# Patient Record
Sex: Female | Born: 1950 | Race: White | Hispanic: No | Smoking: Heavy tobacco smoker
Health system: Southern US, Community
[De-identification: ages and names within clinical notes are randomized; demographics above are authoritative.]

## PROBLEM LIST (undated history)

## (undated) DIAGNOSIS — M6281 Muscle weakness (generalized): Secondary | ICD-10-CM

## (undated) DIAGNOSIS — Z79899 Other long term (current) drug therapy: Secondary | ICD-10-CM

## (undated) DIAGNOSIS — H469 Unspecified optic neuritis: Secondary | ICD-10-CM

## (undated) DIAGNOSIS — R635 Abnormal weight gain: Secondary | ICD-10-CM

## (undated) DIAGNOSIS — I8390 Asymptomatic varicose veins of unspecified lower extremity: Secondary | ICD-10-CM

## (undated) DIAGNOSIS — R55 Syncope and collapse: Secondary | ICD-10-CM

## (undated) DIAGNOSIS — E669 Obesity, unspecified: Secondary | ICD-10-CM

## (undated) DIAGNOSIS — R609 Edema, unspecified: Secondary | ICD-10-CM

## (undated) DIAGNOSIS — H04129 Dry eye syndrome of unspecified lacrimal gland: Secondary | ICD-10-CM

## (undated) DIAGNOSIS — J329 Chronic sinusitis, unspecified: Secondary | ICD-10-CM

## (undated) DIAGNOSIS — R1013 Epigastric pain: Secondary | ICD-10-CM

## (undated) DIAGNOSIS — M949 Disorder of cartilage, unspecified: Secondary | ICD-10-CM

## (undated) DIAGNOSIS — R269 Unspecified abnormalities of gait and mobility: Secondary | ICD-10-CM

## (undated) DIAGNOSIS — F17200 Nicotine dependence, unspecified, uncomplicated: Secondary | ICD-10-CM

## (undated) DIAGNOSIS — E039 Hypothyroidism, unspecified: Secondary | ICD-10-CM

## (undated) DIAGNOSIS — M899 Disorder of bone, unspecified: Secondary | ICD-10-CM

## (undated) DIAGNOSIS — F411 Generalized anxiety disorder: Secondary | ICD-10-CM

## (undated) DIAGNOSIS — I803 Phlebitis and thrombophlebitis of lower extremities, unspecified: Secondary | ICD-10-CM

## (undated) DIAGNOSIS — H268 Other specified cataract: Secondary | ICD-10-CM

## (undated) DIAGNOSIS — I80299 Phlebitis and thrombophlebitis of other deep vessels of unspecified lower extremity: Secondary | ICD-10-CM

## (undated) DIAGNOSIS — R7989 Other specified abnormal findings of blood chemistry: Secondary | ICD-10-CM

## (undated) DIAGNOSIS — G4710 Hypersomnia, unspecified: Secondary | ICD-10-CM

## (undated) DIAGNOSIS — R252 Cramp and spasm: Secondary | ICD-10-CM

## (undated) DIAGNOSIS — IMO0001 Reserved for inherently not codable concepts without codable children: Secondary | ICD-10-CM

## (undated) DIAGNOSIS — B029 Zoster without complications: Secondary | ICD-10-CM

## (undated) DIAGNOSIS — R21 Rash and other nonspecific skin eruption: Secondary | ICD-10-CM

## (undated) DIAGNOSIS — I10 Essential (primary) hypertension: Secondary | ICD-10-CM

## (undated) DIAGNOSIS — Z7901 Long term (current) use of anticoagulants: Secondary | ICD-10-CM

## (undated) DIAGNOSIS — M62838 Other muscle spasm: Secondary | ICD-10-CM

## (undated) DIAGNOSIS — M79609 Pain in unspecified limb: Secondary | ICD-10-CM

## (undated) DIAGNOSIS — M545 Low back pain, unspecified: Secondary | ICD-10-CM

## (undated) DIAGNOSIS — R1031 Right lower quadrant pain: Secondary | ICD-10-CM

## (undated) DIAGNOSIS — H9319 Tinnitus, unspecified ear: Secondary | ICD-10-CM

## (undated) DIAGNOSIS — R002 Palpitations: Secondary | ICD-10-CM

## (undated) DIAGNOSIS — G4700 Insomnia, unspecified: Secondary | ICD-10-CM

## (undated) DIAGNOSIS — Z95828 Presence of other vascular implants and grafts: Secondary | ICD-10-CM

## (undated) DIAGNOSIS — E785 Hyperlipidemia, unspecified: Secondary | ICD-10-CM

## (undated) DIAGNOSIS — M2550 Pain in unspecified joint: Secondary | ICD-10-CM

## (undated) DIAGNOSIS — R42 Dizziness and giddiness: Secondary | ICD-10-CM

## (undated) HISTORY — DX: Dizziness and giddiness: R42

## (undated) HISTORY — DX: Asymptomatic varicose veins of unspecified lower extremity: I83.90

## (undated) HISTORY — DX: Essential (primary) hypertension: I10

## (undated) HISTORY — DX: Unspecified optic neuritis: H46.9

## (undated) HISTORY — DX: Phlebitis and thrombophlebitis of other deep vessels of unspecified lower extremity: I80.299

## (undated) HISTORY — DX: Syncope and collapse: R55

## (undated) HISTORY — DX: Other specified abnormal findings of blood chemistry: R79.89

## (undated) HISTORY — DX: Long term (current) use of anticoagulants: Z79.01

## (undated) HISTORY — DX: Edema, unspecified: R60.9

## (undated) HISTORY — DX: Low back pain, unspecified: M54.50

## (undated) HISTORY — DX: Presence of other vascular implants and grafts: Z95.828

## (undated) HISTORY — DX: Other specified cataract: H26.8

## (undated) HISTORY — DX: Reserved for inherently not codable concepts without codable children: IMO0001

## (undated) HISTORY — DX: Obesity, unspecified: E66.9

## (undated) HISTORY — DX: Generalized anxiety disorder: F41.1

## (undated) HISTORY — DX: Dry eye syndrome of unspecified lacrimal gland: H04.129

## (undated) HISTORY — DX: Other muscle spasm: M62.838

## (undated) HISTORY — DX: Tinnitus, unspecified ear: H93.19

## (undated) HISTORY — DX: Insomnia, unspecified: G47.00

## (undated) HISTORY — DX: Unspecified abnormalities of gait and mobility: R26.9

## (undated) HISTORY — DX: Rash and other nonspecific skin eruption: R21

## (undated) HISTORY — DX: Low back pain: M54.5

## (undated) HISTORY — DX: Right lower quadrant pain: R10.31

## (undated) HISTORY — DX: Pain in unspecified joint: M25.50

## (undated) HISTORY — DX: Pain in unspecified limb: M79.609

## (undated) HISTORY — DX: Hyperlipidemia, unspecified: E78.5

## (undated) HISTORY — DX: Disorder of cartilage, unspecified: M94.9

## (undated) HISTORY — DX: Cramp and spasm: R25.2

## (undated) HISTORY — DX: Other long term (current) drug therapy: Z79.899

## (undated) HISTORY — DX: Disorder of bone, unspecified: M89.9

## (undated) HISTORY — DX: Hypersomnia, unspecified: G47.10

## (undated) HISTORY — PX: CATARACT EXTRACTION, BILATERAL: SHX1313

## (undated) HISTORY — DX: Chronic sinusitis, unspecified: J32.9

## (undated) HISTORY — DX: Nicotine dependence, unspecified, uncomplicated: F17.200

## (undated) HISTORY — DX: Hypothyroidism, unspecified: E03.9

## (undated) HISTORY — DX: Abnormal weight gain: R63.5

## (undated) HISTORY — DX: Palpitations: R00.2

## (undated) HISTORY — DX: Phlebitis and thrombophlebitis of lower extremities, unspecified: I80.3

## (undated) HISTORY — DX: Epigastric pain: R10.13

## (undated) HISTORY — DX: Zoster without complications: B02.9

## (undated) HISTORY — DX: Muscle weakness (generalized): M62.81

## (undated) HISTORY — PX: EYE SURGERY: SHX253

---

## 1969-04-27 HISTORY — PX: OTHER SURGICAL HISTORY: SHX169

## 1984-04-27 HISTORY — PX: OTHER SURGICAL HISTORY: SHX169

## 2001-08-25 ENCOUNTER — Encounter: Payer: Self-pay | Admitting: Internal Medicine

## 2001-08-25 ENCOUNTER — Encounter: Admission: RE | Admit: 2001-08-25 | Discharge: 2001-08-25 | Payer: Self-pay | Admitting: Internal Medicine

## 2002-02-23 ENCOUNTER — Emergency Department (HOSPITAL_COMMUNITY): Admission: EM | Admit: 2002-02-23 | Discharge: 2002-02-23 | Payer: Self-pay | Admitting: Emergency Medicine

## 2002-03-16 ENCOUNTER — Ambulatory Visit (HOSPITAL_COMMUNITY): Admission: RE | Admit: 2002-03-16 | Discharge: 2002-03-16 | Payer: Self-pay | Admitting: Internal Medicine

## 2002-03-16 ENCOUNTER — Inpatient Hospital Stay (HOSPITAL_COMMUNITY): Admission: EM | Admit: 2002-03-16 | Discharge: 2002-03-24 | Payer: Self-pay | Admitting: Emergency Medicine

## 2002-03-20 ENCOUNTER — Encounter: Payer: Self-pay | Admitting: Internal Medicine

## 2002-04-27 DIAGNOSIS — I82409 Acute embolism and thrombosis of unspecified deep veins of unspecified lower extremity: Secondary | ICD-10-CM | POA: Insufficient documentation

## 2006-04-27 HISTORY — PX: OTHER SURGICAL HISTORY: SHX169

## 2007-03-15 ENCOUNTER — Ambulatory Visit: Payer: Self-pay | Admitting: Vascular Surgery

## 2007-03-21 ENCOUNTER — Ambulatory Visit (HOSPITAL_COMMUNITY): Admission: RE | Admit: 2007-03-21 | Discharge: 2007-03-21 | Payer: Self-pay | Admitting: Vascular Surgery

## 2007-03-21 ENCOUNTER — Ambulatory Visit: Payer: Self-pay | Admitting: Vascular Surgery

## 2007-03-29 ENCOUNTER — Ambulatory Visit: Payer: Self-pay | Admitting: Vascular Surgery

## 2007-05-26 HISTORY — PX: OTHER SURGICAL HISTORY: SHX169

## 2007-05-27 ENCOUNTER — Inpatient Hospital Stay (HOSPITAL_COMMUNITY): Admission: RE | Admit: 2007-05-27 | Discharge: 2007-05-28 | Payer: Self-pay | Admitting: Specialist

## 2007-05-28 ENCOUNTER — Ambulatory Visit: Payer: Self-pay | Admitting: Vascular Surgery

## 2007-05-28 ENCOUNTER — Encounter (INDEPENDENT_AMBULATORY_CARE_PROVIDER_SITE_OTHER): Payer: Self-pay | Admitting: Specialist

## 2009-01-12 ENCOUNTER — Emergency Department (HOSPITAL_COMMUNITY): Admission: EM | Admit: 2009-01-12 | Discharge: 2009-01-12 | Payer: Self-pay | Admitting: Emergency Medicine

## 2009-07-01 ENCOUNTER — Ambulatory Visit: Payer: Self-pay | Admitting: Vascular Surgery

## 2009-11-07 ENCOUNTER — Ambulatory Visit: Payer: Self-pay | Admitting: Surgery

## 2009-11-07 ENCOUNTER — Encounter (INDEPENDENT_AMBULATORY_CARE_PROVIDER_SITE_OTHER): Payer: Self-pay | Admitting: Emergency Medicine

## 2009-11-07 ENCOUNTER — Emergency Department (HOSPITAL_COMMUNITY): Admission: EM | Admit: 2009-11-07 | Discharge: 2009-11-07 | Payer: Self-pay | Admitting: Emergency Medicine

## 2010-03-27 HISTORY — PX: OTHER SURGICAL HISTORY: SHX169

## 2010-04-11 ENCOUNTER — Inpatient Hospital Stay (HOSPITAL_COMMUNITY)
Admission: RE | Admit: 2010-04-11 | Discharge: 2010-04-14 | Payer: Self-pay | Source: Home / Self Care | Attending: Orthopaedic Surgery | Admitting: Orthopaedic Surgery

## 2010-04-27 HISTORY — PX: OTHER SURGICAL HISTORY: SHX169

## 2010-06-26 ENCOUNTER — Other Ambulatory Visit (HOSPITAL_COMMUNITY): Payer: Medicare Other

## 2010-06-26 HISTORY — PX: OTHER SURGICAL HISTORY: SHX169

## 2010-06-27 ENCOUNTER — Other Ambulatory Visit: Payer: Self-pay | Admitting: Orthopaedic Surgery

## 2010-06-27 ENCOUNTER — Encounter (HOSPITAL_COMMUNITY): Payer: Medicare Other | Attending: Orthopaedic Surgery

## 2010-06-27 DIAGNOSIS — Z79899 Other long term (current) drug therapy: Secondary | ICD-10-CM | POA: Insufficient documentation

## 2010-06-27 DIAGNOSIS — M1610 Unilateral primary osteoarthritis, unspecified hip: Secondary | ICD-10-CM | POA: Insufficient documentation

## 2010-06-27 DIAGNOSIS — Z86718 Personal history of other venous thrombosis and embolism: Secondary | ICD-10-CM | POA: Insufficient documentation

## 2010-06-27 DIAGNOSIS — Z01812 Encounter for preprocedural laboratory examination: Secondary | ICD-10-CM | POA: Insufficient documentation

## 2010-06-27 DIAGNOSIS — M169 Osteoarthritis of hip, unspecified: Secondary | ICD-10-CM | POA: Insufficient documentation

## 2010-06-27 DIAGNOSIS — Z7982 Long term (current) use of aspirin: Secondary | ICD-10-CM | POA: Insufficient documentation

## 2010-06-27 LAB — BASIC METABOLIC PANEL
BUN: 13 mg/dL (ref 6–23)
Chloride: 106 mEq/L (ref 96–112)
GFR calc non Af Amer: >60 mL/min (ref >60)
Glucose, Bld: 99 mg/dL (ref 70–99)
Potassium: 5.2 mEq/L — ABNORMAL HIGH (ref 3.5–5.1)
Sodium: 142 mEq/L (ref 135–145)

## 2010-06-27 LAB — URINALYSIS, ROUTINE W REFLEX MICROSCOPIC
Bilirubin Urine: NEGATIVE
Glucose, UA: NEGATIVE mg/dL
Hgb urine dipstick: NEGATIVE
Protein, ur: NEGATIVE mg/dL
Urobilinogen, UA: 0.2 mg/dL (ref 0.0–1.0)

## 2010-06-27 LAB — CBC
HCT: 42.4 % (ref 36.0–46.0)
Hemoglobin: 13.9 g/dL (ref 12.0–15.0)
MCH: 27.3 pg (ref 26.0–34.0)
MCHC: 32.8 g/dL (ref 30.0–36.0)
MCV: 83.3 fL (ref 78.0–100.0)
RDW: 13.6 % (ref 11.5–15.5)

## 2010-06-27 LAB — SURGICAL PCR SCREEN: Staphylococcus aureus: NEGATIVE

## 2010-07-04 ENCOUNTER — Inpatient Hospital Stay (HOSPITAL_COMMUNITY): Payer: Medicare Other

## 2010-07-04 ENCOUNTER — Inpatient Hospital Stay (HOSPITAL_COMMUNITY)
Admission: RE | Admit: 2010-07-04 | Discharge: 2010-07-07 | DRG: 470 | Disposition: A | Discharge status: Home Health | Payer: Medicare Other | Source: Ambulatory Visit | Attending: Orthopaedic Surgery | Admitting: Orthopaedic Surgery

## 2010-07-04 DIAGNOSIS — Z96649 Presence of unspecified artificial hip joint: Secondary | ICD-10-CM

## 2010-07-04 DIAGNOSIS — Z86718 Personal history of other venous thrombosis and embolism: Secondary | ICD-10-CM

## 2010-07-04 DIAGNOSIS — M1610 Unilateral primary osteoarthritis, unspecified hip: Principal | ICD-10-CM | POA: Diagnosis present

## 2010-07-04 DIAGNOSIS — F17200 Nicotine dependence, unspecified, uncomplicated: Secondary | ICD-10-CM | POA: Diagnosis present

## 2010-07-04 DIAGNOSIS — R55 Syncope and collapse: Secondary | ICD-10-CM | POA: Diagnosis not present

## 2010-07-04 DIAGNOSIS — J45909 Unspecified asthma, uncomplicated: Secondary | ICD-10-CM | POA: Diagnosis present

## 2010-07-04 DIAGNOSIS — M169 Osteoarthritis of hip, unspecified: Principal | ICD-10-CM | POA: Diagnosis present

## 2010-07-04 LAB — TYPE AND SCREEN
ABO/RH(D): O POS
Antibody Screen: NEGATIVE

## 2010-07-05 LAB — CBC
Hemoglobin: 8.9 g/dL — ABNORMAL LOW (ref 12.0–15.0)
MCH: 26.6 pg (ref 26.0–34.0)
MCHC: 32.1 g/dL (ref 30.0–36.0)
Platelets: 190 10*3/uL (ref 150–400)

## 2010-07-05 LAB — BASIC METABOLIC PANEL
CO2: 27 mEq/L (ref 19–32)
Calcium: 7.6 mg/dL — ABNORMAL LOW (ref 8.4–10.5)
Chloride: 105 mEq/L (ref 96–112)
Creatinine, Ser: 0.67 mg/dL (ref 0.4–1.2)
GFR calc Af Amer: >60 mL/min (ref >60)
GFR calc non Af Amer: >60 mL/min (ref >60)
Sodium: 136 mEq/L (ref 135–145)

## 2010-07-05 LAB — PROTIME-INR
INR: 1.11 (ref 0.00–1.49)
Prothrombin Time: 14.5 seconds (ref 11.6–15.2)

## 2010-07-05 LAB — GLUCOSE, CAPILLARY: Glucose-Capillary: 126 mg/dL — ABNORMAL HIGH (ref 70–99)

## 2010-07-05 NOTE — Op Note (Signed)
NAME:  [name], [name]               ACCOUNT NO.:  [id number]  MEDICAL RECORD NO.:  [id number]           PATIENT TYPE:  I  LOCATION:  1619                         FACILITY:  [facility]  PHYSICIAN:  Vanita Panda. 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  PROCEDURE:  Right total hip arthroplasty through direct approach.  IMPLANTS:  DePuy size 54 acetabular component with Gription, size 54 neutral polyethylene liner, size 12 femoral component with HA-coating and collar and standard offset, size 36 plus 5 ceramic head.  SURGEON:  Vanita Panda. Magnus Ivan, M.D.  ASSISTANT:  Jerolyn Shin. Tresa Res, M.D.  ANESTHESIA:  General.  ANTIBIOTICS:  600 mg of IV clindamycin.  ESTIMATED BLOOD LOSS:  1300 cc.  COMPLICATIONS:  None.  INDICATIONS:  [name] is a 60 year old female with severe arthritis involving both her hips.  She underwent successful direct anterior left hip replacement under 3 months ago.  Due to the severe arthritis in her right hip, she now wishes to proceed with surgery on that hip.  The risks and benefits of this have been explained to her in detail and she does wish to proceed with surgery, given the successful outcome of her other hip.  PROCEDURE DESCRIPTION:  After informed consent was obtained and appropriate right hip was marked, she was brought to the operating room and placed supine on the operating table.  General anesthesia was obtained.  A Foley catheter was placed.  Prior to placing her on the table, traction boots were applied.  She was placed on the Hana table. Her right hip was then prepped and draped with ChloraPrep and sterile drapes were applied.  A time-out was called and she  was identified as the correct patient and the correct right hip.  I then made a standard anterior approach to the hip with making an incision 1 fingerbreadth distal and 3 fingerbreadths posterior to the anterior superior iliac spine.  I dissected down to the tensor fascia latae and then divided the tensor fascia latae longitudinally.  I then proceeded with a direct anterior approach.  Cobra retractors were placed around the lateral neck and then dissecting up the rectus femoris under the anterior neck.  I was able to divide the hip capsule and then we made a saw cut proximal to the lesser trochanter.  I then placed a corkscrew in the head and removed the head in its entirety.  I cleaned the acetabulum of debris including soft tissue around the rim.  We began reaming in 1 mm increments from 44 up to a 53.  Under direct fluoroscopic guidance, the last few rims were placed and we were able to select a size 54 acetabular component, which corresponded with her acetabular component placed on the opposite side.  We then placed a real 54 neutral liner and turned attention to the femur.  We externally rotated the leg to 90 degrees, placed a  bone hook where hook goes off bed, but then under the vastus ridge and femur, we extended and adducted the hip.  I then released tissue from around the greater trochanter and the lateral joint capsule.  We were able to then gain access to the femoral canal, used a box cutting guide and then broaching from a size 8 broach up to size 12. Although she had a 13 stem on the other side, it was felt that the size 12 had a nice solid fit.  We then tried a 36 +1.5 head followed by 36 +5 head and we measured this under direct fluoroscopic guidance with the hip centered in the pelvis and it was felt like the leg lengths were equal.  I then removed the trial components and placed the real HA- coated stem size 12 with standard offset and a collar.  I placed a real 36 +5  ceramic head and reduced this back into the acetabulum.  I rotated to 90 degrees and then internally rotated to 45 degrees.  We pulled shuck and she had a good fit and feel of the component.  We then copiously irrigated the tissues and closed the joint capsule with interrupted #1 Ethibond suture.  I reapproximated the tensor fasciae latae with interrupted #1 Vicryl, followed by 2-0 Vicryl subcutaneous tissue and 4-0 Vicryl subcuticular stitch.  Dermabond and sterile drapes were applied.  The patient was awakened, taken off the table, extubated, and taken to recovery room in stable condition.  All final counts were correct and no complications noted.     Vanita Panda. Magnus Ivan, M.D.     CYB/MEDQ  D:  07/04/2010  T:  07/05/2010  Job:  [id number]  Electronically Signed by Doneen Poisson M.D. on 07/05/2010 07:18:10 PM

## 2010-07-05 NOTE — H&P (Signed)
  NAME:  [name], [name]               ACCOUNT NO.:  [id number]  MEDICAL RECORD NO.:  [id number]           PATIENT TYPE:  I  LOCATION:  1619                         FACILITY:  [facility]  PHYSICIAN:  Vanita Panda. Magnus Ivan, M.D.DATE OF BIRTH:  11-15-1950  DATE OF ADMISSION:  07/04/2010 DATE OF DISCHARGE:                             HISTORY & PHYSICAL   CHIEF COMPLAINT:  Severe right hip pain.  HISTORY OF PRESENT ILLNESS:  [name] is a very pleasant 60 year old female who has debilitating arthritis involving the right hip.  She actually has had this on both hips.  She underwent a successful left total hip replacement just under 3 months ago, now presents to have the right hip replaced.  She is otherwise an individual who has had a history of DVT in the past.  She has asthma, bladder problems, cataracts, depression, migraines, pneumonia, and thyroid problems among her past medical history.  PAST SURGICAL HISTORY:  Back surgery in 2009, total hip replacement for DVT in 2004, hip replacement on the left side in 2011, breast biopsy in 1986.  MEDICATIONS:  See medical reconciliation orders.  SOCIAL HISTORY:  She is married.  She lives with her husband.  She is self-employed.  She does smoke a pack a day for the last 43 years and she does drink alcohol rarely.  REVIEW OF SYSTEMS:  Negative for chest pain, shortness of breath, fever, chills, nausea, and vomiting.  PHYSICAL EXAMINATION:  VITAL SIGNS:  She is afebrile with stable vital signs. GENERAL:  She is oriented x3 in no acute distress or obvious discomfort. HEENT:  Normocephalic, atraumatic.  Pupils are equal, round, and reactive to light.  Extraocular muscles are intact. NECK:  Supple. LUNGS:  Clear to auscultation bilaterally. HEART:  Regular rate and rhythm. ABDOMEN:  Benign. EXTREMITIES:  Right hip shows severe pain to arch of motion.  IMAGING:  X-rays confirm end-stage arthritis of the right hip and a well- seated implant  on the left hip.  IMPRESSION:  This is a 60 year old female with debilitating arthritis of the right hip.  PLAN:  We are going to proceed today with a right total hip arthroplasty.  She understands the risks and benefits of surgery and we will have this formerly and then admit her as an inpatient to the orthopedic floor.     Vanita Panda. Magnus Ivan, M.D.     CYB/MEDQ  D:  07/04/2010  T:  07/04/2010  Job:  [id number]  Electronically Signed by Doneen Poisson M.D. on 07/05/2010 07:18:08 PM

## 2010-07-06 LAB — BASIC METABOLIC PANEL
BUN: 2 mg/dL — ABNORMAL LOW (ref 6–23)
Calcium: 7.7 mg/dL — ABNORMAL LOW (ref 8.4–10.5)
Chloride: 108 mEq/L (ref 96–112)
Creatinine, Ser: 0.55 mg/dL (ref 0.4–1.2)
GFR calc Af Amer: >60 mL/min (ref >60)
GFR calc non Af Amer: >60 mL/min (ref >60)

## 2010-07-06 LAB — PROTIME-INR
INR: 1.26 (ref 0.00–1.49)
Prothrombin Time: 16 seconds — ABNORMAL HIGH (ref 11.6–15.2)

## 2010-07-06 LAB — CBC
MCH: 26.6 pg (ref 26.0–34.0)
MCHC: 32.3 g/dL (ref 30.0–36.0)
MCV: 82.6 fL (ref 78.0–100.0)
Platelets: 170 10*3/uL (ref 150–400)
RDW: 14.1 % (ref 11.5–15.5)

## 2010-07-07 LAB — CBC
HCT: 29 % — ABNORMAL LOW (ref 36.0–46.0)
Hemoglobin: 8.4 g/dL — ABNORMAL LOW (ref 12.0–15.0)
Hemoglobin: 9.3 g/dL — ABNORMAL LOW (ref 12.0–15.0)
MCH: 26.8 pg (ref 26.0–34.0)
MCH: 28.1 pg (ref 26.0–34.0)
MCH: 28.5 pg (ref 26.0–34.0)
MCHC: 32.6 g/dL (ref 30.0–36.0)
MCHC: 33.4 g/dL (ref 30.0–36.0)
MCHC: 33.5 g/dL (ref 30.0–36.0)
MCV: 85.9 fL (ref 78.0–100.0)
MCV: 86.6 fL (ref 78.0–100.0)
Platelets: 175 10*3/uL (ref 150–400)
Platelets: 191 10*3/uL (ref 150–400)
RDW: 14 % (ref 11.5–15.5)
RDW: 13 % (ref 11.5–15.5)
RDW: 13.2 % (ref 11.5–15.5)

## 2010-07-07 LAB — PROTIME-INR
INR: 1.15 (ref 0.00–1.49)
Prothrombin Time: 15.5 seconds — ABNORMAL HIGH (ref 11.6–15.2)
Prothrombin Time: 16 seconds — ABNORMAL HIGH (ref 11.6–15.2)

## 2010-07-07 LAB — BASIC METABOLIC PANEL
BUN: 6 mg/dL (ref 6–23)
BUN: 3 mg/dL — ABNORMAL LOW (ref 6–23)
BUN: 4 mg/dL — ABNORMAL LOW (ref 6–23)
CO2: 28 mEq/L (ref 19–32)
CO2: 29 mEq/L (ref 19–32)
Calcium: 8.2 mg/dL — ABNORMAL LOW (ref 8.4–10.5)
Chloride: 105 mEq/L (ref 96–112)
Creatinine, Ser: 0.6 mg/dL (ref 0.4–1.2)
Creatinine, Ser: 0.59 mg/dL (ref 0.4–1.2)
Creatinine, Ser: 0.62 mg/dL (ref 0.4–1.2)
Creatinine, Ser: 0.64 mg/dL (ref 0.4–1.2)
GFR calc non Af Amer: >60 mL/min (ref >60)
GFR calc non Af Amer: >60 mL/min (ref >60)
Glucose, Bld: 139 mg/dL — ABNORMAL HIGH (ref 70–99)
Glucose, Bld: 114 mg/dL — ABNORMAL HIGH (ref 70–99)
Glucose, Bld: 120 mg/dL — ABNORMAL HIGH (ref 70–99)
Potassium: 3.4 mEq/L — ABNORMAL LOW (ref 3.5–5.1)
Potassium: 3.5 mEq/L (ref 3.5–5.1)

## 2010-07-07 LAB — TYPE AND SCREEN: ABO/RH(D): O POS

## 2010-07-08 LAB — URINALYSIS, ROUTINE W REFLEX MICROSCOPIC
Glucose, UA: NEGATIVE mg/dL
Ketones, ur: NEGATIVE mg/dL
pH: 5.5 (ref 5.0–8.0)

## 2010-07-08 LAB — URINE MICROSCOPIC-ADD ON

## 2010-07-08 LAB — PROTIME-INR: Prothrombin Time: 12.8 seconds (ref 11.6–15.2)

## 2010-07-13 LAB — POCT I-STAT, CHEM 8
BUN: 12 mg/dL (ref 6–23)
Calcium, Ion: 1.16 mmol/L (ref 1.12–1.32)
Chloride: 106 mEq/L (ref 96–112)
Creatinine, Ser: 0.6 mg/dL (ref 0.4–1.2)
Glucose, Bld: 96 mg/dL (ref 70–99)
HCT: 41 % (ref 36.0–46.0)

## 2010-07-13 LAB — PROTIME-INR: INR: 0.91 (ref 0.00–1.49)

## 2010-09-09 NOTE — Procedures (Signed)
DUPLEX DEEP VENOUS EXAM - LOWER EXTREMITY   INDICATION:  Evaluation prior to placement of Greenfield filter.   HISTORY:  Edema:  No.  Trauma/Surgery:  No.  Pain:  Left leg pain.  PE:  Patient had a PE 4 years ago.  Previous DVT:  Left leg DVT in 2004 in the superficial femoral vein  secondary to hormone replacement therapy.  Anticoagulants:  Coumadin.  Other:  Patient is going to have back surgery soon.   DUPLEX EXAM:                CFV   SFV   PopV  PTV    GSV                R  L  R  L  R  L  R   L  R  L  Thrombosis    o              P  Spontaneous   +              +  Phasic        +              +  Augmentation  +              +  Compressible  +              P  Competent     +              o   Legend:  + - yes  o - no  p - partial  D - decreased   IMPRESSION:  1. Chronic nonobstructive left popliteal DVT.  The left popliteal vein      is incompetent.  2. No evidence of right common femoral, external iliac vein, or      inferior vena cava thrombus.  3. Right common femoral vein diameter is 1.6 cm.  4. IVC diameter 1.6 cm, AP 2.2 cm, transverse.    _____________________________  Di Kindle. Edilia Bo, M.D.   MC/MEDQ  D:  03/15/2007  T:  03/16/2007  Job:  [id number]

## 2010-09-09 NOTE — Op Note (Signed)
NAME:  [name], [name]               ACCOUNT NO.:  [id number]   MEDICAL RECORD NO.:  [id number]          PATIENT TYPE:  AMB   LOCATION:  SDS                          FACILITY:  [facility]   PHYSICIAN:  Di Kindle. Edilia Bo, M.D.DATE OF BIRTH:  1950-11-21   DATE OF PROCEDURE:  03/21/2007  DATE OF DISCHARGE:                               OPERATIVE REPORT   PREOPERATIVE DIAGNOSES:  1. History of deep venous thrombosis.  2. Spinal stenosis.  3. Osteoarthritis of the hip.   POSTOPERATIVE DIAGNOSES:  1. History of deep venous thrombosis.  2. Spinal stenosis.  3. Osteoarthritis of the hip.   PROCEDURE:  Placement of a Bard G2 IVC filter.   TECHNIQUE:  The patient was taken to the PV lab at [facility].  The groins  were prepped and draped in usual sterile fashion.  After the skin was  infiltrated with 1% lidocaine, the right femoral vein was cannulated and  guidewire introduced into the inferior vena cava under fluoroscopic  control.  The dilator and sheath were then advanced over the wire and  the dilator and wire removed and a cavogram obtained.  The cava appeared  to be clearly less than 38 mm.  The level of the renal veins was clearly  identified.  Using road mapping technique.  The wire and dilator were  then passed through the sheath and this was advanced so that the markers  were positioned below the renal vein and the inferior vena cava.  The  wire and dilator were then removed.  The femoral filter was loaded and  advanced into position.  The 10 was then trapped and the sheath  retracted to allow the placement of the IVC filter below the level of  the renal veins.  There was excellent position, no complications.  The  sheath was removed.  Pressure held for hemostasis.      Di Kindle. Edilia Bo, M.D.  Electronically Signed     CSD/MEDQ  D:  03/21/2007  T:  03/21/2007  Job:  [id number]   cc:   Jene Every, M.D.

## 2010-09-09 NOTE — Consult Note (Signed)
NEW PATIENT CONSULTATION   [name], [name]  DOB:  08-22-1950                                       03/15/2007  UYQIH#:[id number]   I saw [name] in the office today in consultation for possible  placement of an IVC filter.  She was referred by Dr. Jene Every.  She  has apparently severe spinal stenosis at L3-L4 and is to undergo some  epidural injections to see how this helps with her pain.  She is also  being considered for a total hip replacement for significant  osteoarthritis of her hip.  She does have a history of a left lower  extremity DVT and PE and has been on Coumadin since 2003.  Her  hypercoagulable workup was apparently normal at that time, and it was  felt that her DVT was related to her hormonal replacement therapy.   In anticipation of a possible lumbar decompression and total hip  replacement, we were asked to consider for placement of an IVC filter so  that she could be off of her Coumadin during her epidural injections and  also for protection from PE during possible surgery.   PAST MEDICAL HISTORY:  Fairly unremarkable.  She denies any history of  diabetes, hypertension, hypercholesterolemia, history of previous  myocardial infarction or history of congestive heart failure.   FAMILY HISTORY:  There is no history of premature cardiovascular  disease.   SOCIAL HISTORY:  She is married.  She smokes a pack per day of  cigarettes.   Review of systems and medications are documented on the medical history  form in her chart.   PHYSICAL EXAMINATION:  This is a pleasant 60 year old woman who appears  her stated age.  Blood pressure is 127/82, heart rate 67, saturation  98%.  HEENT:  There is no cervical lymphadenopathy.  I do not detect any  carotid bruits.  Extraocular motions are intact.  Lungs are clear  bilaterally to auscultation.  On cardiac exam, she has a regular rate  and rhythm.  Her abdomen is soft and nontender.  She has  palpable  femoral pulses.  She has no significant lower extremity swelling.   She did have a venous duplex scan of the right groin, which shows no  evidence of clot in the right femoral vein.   Given her plans for epidural and the need to be off Coumadin, I think it  is very reasonable to place a filter to prevent PE.  This will also  protect her around her potentially upcoming surgery.  We have discussed  the indications for placement of an IVC filter and the potential  complications including but not limited to PE, caval thrombosis, filter  migration or malpositioning.  All of her questions were answered, and  she is agreeable to proceed on March 21, 2007.  She has had a dye  allergy, and we will premedicate her for this.  She will start aspirin  today since we are discontinuing her Coumadin up until the procedure  today.   Di Kindle. Edilia Bo, M.D.  Electronically Signed   CSD/MEDQ  D:  03/15/2007  T:  03/16/2007  Job:  [id number]   cc:   Jene Every, M.D.

## 2010-09-09 NOTE — Assessment & Plan Note (Signed)
OFFICE VISIT   Mccarty, Lindsey K  DOB:  30-Nov-1950                                       03/29/2007  ZOXWR#:[id number]   I saw the patient in the office today in followup after recent placement  of an IVC filter.  She has a history of DVT and spinal stenosis and is  scheduled for back surgery.  She had an IVC filter placed on 03/21/2007.  She noted a small lump in the right groin and wanted to have this  evaluated.   EXAMINATION:  Blood pressure is 135/78, heart rate is 85.  Her right  groin has a palpable pulse with very small hematoma, which I think  should resolve without any problems.  I have reassured her that I did  not see any problem in the right groin, and it is safe to proceed with  her back surgery as scheduled.   Di Kindle. Edilia Bo, M.D.  Electronically Signed   CSD/MEDQ  D:  03/29/2007  T:  03/30/2007  Job:  [id number]

## 2010-09-09 NOTE — Op Note (Signed)
NAME:  [name], [name]               ACCOUNT NO.:  [id number]   MEDICAL RECORD NO.:  [id number]          PATIENT TYPE:  OIB   LOCATION:  1523                         FACILITY:  [facility]   PHYSICIAN:  Jene Every, M.D.    DATE OF BIRTH:  Sep 19, 1950   DATE OF PROCEDURE:  DATE OF DISCHARGE:                               OPERATIVE REPORT   PREOPERATIVE DIAGNOSES:  Spinal stenosis 3-4.   POSTOPERATIVE DIAGNOSES:  Spinal stenosis 3-4, 2-3.   PROCEDURE PERFORMED:  Central laminectomy of 3 with central  decompression at 2-3 and 3-4, foraminotomies of 4.   ANESTHESIA:  General.   ASSISTANT:  Georges Lynch. Gioffre, M.D.   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  She was indicated for decompression for a  disabling neurogenic claudication and also hip arthritis. The risks and  benefits discussed including bleeding, injury to neurovascular  structures, CSF leakage, epidural fibrosis, adjacent segment disease,  need for fusion in the future, anesthetic complications, etc.   TECHNIQUE:  The patient in the supine position and after the induction  of adequate general anesthesia and 1 gram of Kefzol, she was placed  prone on the Martell frame.  All bony prominences well-padded padded.  The hips were gently flexed. The lumbar region prepped and draped in the  usual sterile fashion.  An incision was made from the spinous process of  2 to 4, subcutaneous tissue was dissected, electrocautery was utilized  to achieve hemostasis.  The dorsolumbar fascia was identified and  divided in line with the skin incision.  The paraspinous muscle was  elevated from the lamina at 2, 3 and 4. A McCullough retractor was  placed, a Kocher was placed in the spinous process reconfirming the x-  ray.   Next the spinous process of 3 was removed and partial of 2.  This was a  Gaffer.   Electrocautery was utilized to achieve hemostasis. We  used a 2 mm Kerrison to begin a hemilaminotomy out laterally at the  caudad edge of 3. Severe central stenosis was noted.  We concluded a  central laminectomy of 3. There was stenosis noted extending up into the  2-3 space.  We removed the ligamentum flavum from 2-3 and decompressed  out to the medial border of the pedicle.  She did have scoliosis. There  was a rotational deformity noted.  We noted epidural fat cephalad.  Extending caudad with the use of the operating microscope, I gently  mobilized the thecal sac at the area of compression and there was severe  stenosis and fibrosis noted.  This was gently immobilized.  I performed  hemilaminotomies of the cephalad edge of 4 bilaterally.  We undercut the  facets of the medial border of the pedicle bilaterally. Foraminotomies  of 4 were performed.  We had restoration of the thecal sac following  this and I was able to pass a hockey stick probe on either side of  thecal sac down the foramen of 4 where an  x-ray was taken confirming the  extent.  The foramen of 3 and 2 were widely open as well.  The thecal  sac was pulsatile and re-expanded.  The central area of fibrosis over  the central lamina of four, noncompressive but adherent. We felt it best  to not manipulate that for concern of a dural tear.  The wound was  copiously irrigated.  We placed bone wax on the bleeding cancellous  surfaces, thrombin-soaked Gelfoam and a laminotomy defect after FloSeal  was placed.  She did have recent use of aspirin.  We placed a Hemovac  and brought it out through a lateral stab wound in the skin. Again  inspected and no evidence of active bleeding or CSF leakage and a well  decompressed thecal sac and foramen out to the medial border of the  pedicle.   Next, the [facility] retractor removed, paraspinous muscles inspected,  no evidence of active bleeding. The wound was copiously irrigated. The   dorsolumbar fascia reapproximated with #1 Vicryl interrupted figure-of-  eight sutures.  The subcutaneous tissue reapproximated with #0 and 2-0  Vicryl simple sutures. The skin was reapproximated with staples. The  wound was dressed sterilely. She was placed supine on the hospital bed,  extubated without difficulty and transported to the recovery room in  satisfactory condition.  The patient tolerated the procedure well, there  were no complications.  Blood loss 100 mL.      Jene Every, M.D.  Electronically Signed     JB/MEDQ  D:  05/26/2007  T:  05/26/2007  Job:  [id number]

## 2010-09-12 NOTE — Discharge Summary (Signed)
NAME:  [name], [name]                         ACCOUNT NO.:  [id number]   MEDICAL RECORD NO.:  [id number]                   PATIENT TYPE:  INP   LOCATION:  3733                                 FACILITY:  [facility]   PHYSICIAN:  Maxwell Caul, M.D.             DATE OF BIRTH:  July 03, 1950   DATE OF ADMISSION:  03/16/2002  DATE OF DISCHARGE:  03/24/2002                                 DISCHARGE SUMMARY   HISTORY OF PRESENT ILLNESS:  This is a 60 year old woman who went to the  vascular laboratory for a workup of left leg edema, at which time an  extensive DVT was diagnosed. She also revealed a history of presenting to  the emergency room  with pleuritic chest pain at some period prior to  this  in the emergency room. She was sent home. She is on hormone replacement  therapy, a smoker  and has a family history of clotting/DVT in her mother,  but no personal history.   PAST MEDICAL HISTORY:  1. Hypothyroidism.  2. Tobacco abuse, one and a half packs per  day.  3. Chronic sinusitis.  4. Multiple dental abscesses, status post extraction.   MEDICATIONS:  1. Armour thyroid replacement therapy.  2. Vioxx.   ALLERGIES:  1. PENICILLIN.  2. CODEINE.  3. CONTRAST DYE.   PHYSICAL EXAMINATION:  VITAL SIGNS:  Temperature 97.9, blood pressure  130/79, heart rate 81, saturations 97%.  RESPIRATORY:  Lungs were clear without wheezing.  CARDIOVASCULAR:  Regular rate and rhythm.  EXTREMITIES:  Left lower extremity edema, 2+, tender in the left heel and  calf.  NEUROLOGIC:  Nonfocal.   LABORATORY DATA:  Comprehensive metabolic panel was essentially  unremarkable. Her homocysteine level was elevated at 19.42. Factor V Leiden  was pending at the time of this dictation. Initial PT, INR and PTT were in  the therapeutic  range. At discharge her INR is 2.3, having  received  Coumadin 7.5 two days prior to  this. She will be discharged on  7 mg. The  white count was 5.6, hemoglobin 12.2, platelets  249. Her ESR was 8.   Radiology:  A chest x-ray showed chronic bronchitis. A V\Q scan done  showed low probability  of pulmonary embolism, severe air trapping diffusely  in both lungs consistent with asthma or COPD, which was accounted for by her  heavy cigarette smoking.   HOSPITAL COURSE:  This patient had presented sometime prior to  the  emergency room with respiratory symptoms and then subsequently a week or two  later presented with a DVT. The thought was clinically that she had probably  had a pulmonary embolism and she will be managed as such. In terms of risk  factors, the possibility that she was on hormone replacement therapy and her  cigarette smoking. She was found to have an elevated homocysteine level,  therefore she will need treatment with folic acid which  I will prescribe.   She was anticoagulated with unfractionated heparin and progressed to  Coumadin without complication. Her edema in her leg and pain resolved. She  continues to have bronchitis symptoms but certainly no evidence of recurrent  pulmonary embolism.   IMPRESSION:  1. Deep venous thrombosis.  2. Probable pulmonary embolism (see discussion above).  3. Elevated homocysteine levels (see discussion above).  4. Chronic tobacco abuse.  5. Probable chronic obstructive pulmonary disease.   DISCHARGE MEDICATIONS:  1. Armour thyroid 90 mg p.o. q.d.  2. Vioxx 25 q.d.  3. Azithromycin 250 p.o. q.d. to continue for a further 5 days.  4. Coumadin 7 mg will be given on the day of discharge, March 24, 2002.     She is to have 7 mg further on Saturday and Sunday, and then will be back     in our office for a PT and INR and further instructions.  5. I will discuss with her whether she wants a nicotine patch prior to  her     discharge.  6. Folic acid 1 mg or a multivitamin with folic acid 1 mg q.d.   CONDITION ON DISCHARGE:  The patient is discharged in stable condition. The  risks and benefits of chronic  anticoagulation for at least four or five  months have been discussed with the patient as well as folic acid use and I  will also talk to her about discontinuing hormone replacement therapy.                                               Maxwell Caul, M.D.    MGR/MEDQ  D:  03/24/2002  T:  03/24/2002  Job:  [id number]

## 2011-01-15 LAB — BASIC METABOLIC PANEL
BUN: 5 — ABNORMAL LOW
BUN: 6
BUN: 9
CO2: 25
CO2: 27
Chloride: 102
Chloride: 105
Creatinine, Ser: 0.67
Creatinine, Ser: 0.74
GFR calc non Af Amer: >60
Glucose, Bld: 100 — ABNORMAL HIGH
Glucose, Bld: 144 — ABNORMAL HIGH
Glucose, Bld: 95
Potassium: 3.9

## 2011-01-15 LAB — CBC
HCT: 32.4 — ABNORMAL LOW
Hemoglobin: 11.4 — ABNORMAL LOW
MCV: 82.3
MCV: 83
Platelets: 240
Platelets: 249
RDW: 13.2
RDW: 13.3
WBC: 10.6 — ABNORMAL HIGH

## 2011-01-15 LAB — D-DIMER, QUANTITATIVE: D-Dimer, Quant: 0.47

## 2011-02-03 LAB — COMPREHENSIVE METABOLIC PANEL
AST: 14
BUN: 10
CO2: 26
Calcium: 8.7
Chloride: 104
Creatinine, Ser: 0.68
GFR calc Af Amer: >60
GFR calc non Af Amer: >60
Total Bilirubin: 0.8

## 2011-02-03 LAB — CBC
HCT: 39.9
MCHC: 34.7
MCV: 82.7
RBC: 4.83
WBC: 8.1

## 2011-02-03 LAB — APTT: aPTT: 33

## 2011-02-03 LAB — PROTIME-INR
INR: 1.6 — ABNORMAL HIGH
Prothrombin Time: 19.9 — ABNORMAL HIGH

## 2011-12-02 ENCOUNTER — Other Ambulatory Visit: Payer: Self-pay | Admitting: Internal Medicine

## 2011-12-02 DIAGNOSIS — R1900 Intra-abdominal and pelvic swelling, mass and lump, unspecified site: Secondary | ICD-10-CM

## 2011-12-02 DIAGNOSIS — R109 Unspecified abdominal pain: Secondary | ICD-10-CM

## 2011-12-07 ENCOUNTER — Ambulatory Visit
Admission: RE | Admit: 2011-12-07 | Discharge: 2011-12-07 | Disposition: A | Discharge status: Home / Self Care | Payer: Medicare Other | Source: Ambulatory Visit | Attending: Internal Medicine | Admitting: Internal Medicine

## 2011-12-07 ENCOUNTER — Other Ambulatory Visit: Payer: Medicare Other

## 2011-12-07 DIAGNOSIS — R109 Unspecified abdominal pain: Secondary | ICD-10-CM

## 2011-12-07 DIAGNOSIS — R1900 Intra-abdominal and pelvic swelling, mass and lump, unspecified site: Secondary | ICD-10-CM

## 2012-01-11 ENCOUNTER — Other Ambulatory Visit: Payer: Self-pay | Admitting: Endocrinology

## 2012-01-11 DIAGNOSIS — E049 Nontoxic goiter, unspecified: Secondary | ICD-10-CM

## 2012-01-12 ENCOUNTER — Ambulatory Visit
Admission: RE | Admit: 2012-01-12 | Discharge: 2012-01-12 | Disposition: A | Discharge status: Home / Self Care | Payer: Medicare Other | Source: Ambulatory Visit | Attending: Endocrinology | Admitting: Endocrinology

## 2012-01-12 DIAGNOSIS — E049 Nontoxic goiter, unspecified: Secondary | ICD-10-CM

## 2012-01-19 ENCOUNTER — Other Ambulatory Visit: Payer: Self-pay | Admitting: Endocrinology

## 2012-01-19 DIAGNOSIS — E049 Nontoxic goiter, unspecified: Secondary | ICD-10-CM

## 2012-01-22 ENCOUNTER — Ambulatory Visit
Admission: RE | Admit: 2012-01-22 | Discharge: 2012-01-22 | Disposition: A | Discharge status: Home / Self Care | Payer: Medicare Other | Source: Ambulatory Visit | Attending: Endocrinology | Admitting: Endocrinology

## 2012-01-22 DIAGNOSIS — E049 Nontoxic goiter, unspecified: Secondary | ICD-10-CM

## 2012-08-18 ENCOUNTER — Other Ambulatory Visit: Payer: Self-pay | Admitting: Internal Medicine

## 2012-08-18 DIAGNOSIS — E010 Iodine-deficiency related diffuse (endemic) goiter: Secondary | ICD-10-CM

## 2012-08-24 ENCOUNTER — Ambulatory Visit
Admission: RE | Admit: 2012-08-24 | Discharge: 2012-08-24 | Disposition: A | Discharge status: Home / Self Care | Payer: Medicare Other | Source: Ambulatory Visit | Attending: Internal Medicine | Admitting: Internal Medicine

## 2012-08-24 ENCOUNTER — Other Ambulatory Visit: Payer: Self-pay | Admitting: Endocrinology

## 2012-08-24 DIAGNOSIS — E010 Iodine-deficiency related diffuse (endemic) goiter: Secondary | ICD-10-CM

## 2012-08-31 ENCOUNTER — Ambulatory Visit (INDEPENDENT_AMBULATORY_CARE_PROVIDER_SITE_OTHER): Payer: Medicare Other | Admitting: Internal Medicine

## 2012-08-31 ENCOUNTER — Encounter: Payer: Self-pay | Admitting: *Deleted

## 2012-08-31 ENCOUNTER — Encounter: Payer: Self-pay | Admitting: Internal Medicine

## 2012-08-31 VITALS — BP 126/66 | HR 74 | Temp 97.1°F | Resp 18 | Ht 67.0 in | Wt 192.0 lb

## 2012-08-31 DIAGNOSIS — E785 Hyperlipidemia, unspecified: Secondary | ICD-10-CM

## 2012-08-31 DIAGNOSIS — E039 Hypothyroidism, unspecified: Secondary | ICD-10-CM

## 2012-08-31 DIAGNOSIS — Z9181 History of falling: Secondary | ICD-10-CM | POA: Insufficient documentation

## 2012-08-31 DIAGNOSIS — M25562 Pain in left knee: Secondary | ICD-10-CM

## 2012-08-31 DIAGNOSIS — M25569 Pain in unspecified knee: Secondary | ICD-10-CM | POA: Insufficient documentation

## 2012-08-31 DIAGNOSIS — I10 Essential (primary) hypertension: Secondary | ICD-10-CM | POA: Insufficient documentation

## 2012-08-31 DIAGNOSIS — G4733 Obstructive sleep apnea (adult) (pediatric): Secondary | ICD-10-CM | POA: Insufficient documentation

## 2012-08-31 NOTE — Patient Instructions (Signed)
Call [facility] for follow-up of the abnormal sleep study.

## 2012-08-31 NOTE — Progress Notes (Signed)
Subjective:    Patient ID: [name], female    DOB: 11/05/1950, 62 y.o.   MRN: [id number]  HPI Had sleep study done at [facility] Sleep lab. It showed a mild obstructive sleep apnea. They're requesting that she return for CPAP titration  Hypothyroidism continues to be followed by endocrinologist. She was put on Tirosint. She has not noticed much difference while on this medication. Thyroid testing continues to be normal. Thyroid ultrasound in April 2014 showed bilateral multinodular goiter which is unchanged from previous studies.  Hypertension remains under good control  Chronic low back pain is stable  Edema is chronic and stable.  Patient recently had a fall on her left knee gave out on her. She has significant discomfort. She is seeing the orthopedist, Dr. Alicia Amel and. She recently had an MRI done of her left knee. She is waiting for the results. Patient had a seat lift with toileting and this has helped her discomfort. She was having difficulty sitting down and getting up again from the toilet.   Review of Systems  Constitutional: Positive for activity change. Negative for chills, diaphoresis, appetite change and fatigue.       Recent diagnosis of obstructive sleep apnea. She will need CPAP.  Eyes: Negative.   Respiratory: Negative.   Cardiovascular: Negative.   Gastrointestinal: Negative.   Endocrine: Negative.   Genitourinary: Negative.   Musculoskeletal:       Patient continues to have chronic back discomfort. He has had chronic issues with her knees for several years. He had a recent fall significantly hurt her left knee.  Skin: Negative.   Hematological:       Patient remains on anticoagulation due to previous history of DVT and pulmonary embolus  Psychiatric/Behavioral: Negative.        Objective:   Physical Exam  Constitutional: She is oriented to person, place, and time.  Overweight middle-aged female.  HENT:  Head: Normocephalic and atraumatic.   Right Ear: External ear normal.  Left Ear: External ear normal.  Nose: Nose normal.  Eyes: Conjunctivae and EOM are normal. Pupils are equal, round, and reactive to light.  Neck: Normal range of motion. Neck supple. No JVD present. No tracheal deviation present. No thyromegaly present.  Cardiovascular: Normal rate, regular rhythm, normal heart sounds and intact distal pulses.  Exam reveals no gallop and no friction rub.   No murmur heard. Pulmonary/Chest: Effort normal and breath sounds normal. No respiratory distress. She has no wheezes. She has no rales.  Abdominal: Soft. Bowel sounds are normal. She exhibits no distension and no mass. There is no tenderness.  Musculoskeletal: Normal range of motion. She exhibits edema and tenderness.  Lymphadenopathy:    She has no cervical adenopathy.  Neurological: She is alert and oriented to person, place, and time. She has normal reflexes. No cranial nerve deficit. Coordination normal.  Skin: Skin is warm and dry. No rash noted. No erythema. No pallor.  Psychiatric: She has a normal mood and affect. Her behavior is normal. Judgment and thought content normal.          Assessment & Plan:  1. Unspecified hypothyroidism Patient is still upset about continuing problems appear most and malaise. Blood work appears to be normal. She does have a multinodular goiter. - TSH; Future  2. Unspecified essential hypertension Under good control  3. Personal history of fall Simple fall. Meds related to instability of the left knee. She is under orthopedic evaluation.  4. Pain in joint, lower  leg, left Seeing Dr. Alicia Amel about her left knee pains.  5. Obstructive sleep apnea Advised patient to contact sleep lab titration study for CPAP.  6. Other and unspecified hyperlipidemia Recheck at next visit. - Lipid panel; Future

## 2012-09-28 ENCOUNTER — Telehealth: Payer: Self-pay | Admitting: *Deleted

## 2012-09-28 ENCOUNTER — Emergency Department (HOSPITAL_COMMUNITY)
Admission: EM | Admit: 2012-09-28 | Discharge: 2012-09-28 | Disposition: A | Discharge status: Home / Self Care | Payer: Medicare Other | Attending: Emergency Medicine | Admitting: Emergency Medicine

## 2012-09-28 ENCOUNTER — Encounter (HOSPITAL_COMMUNITY): Payer: Self-pay | Admitting: *Deleted

## 2012-09-28 DIAGNOSIS — L272 Dermatitis due to ingested food: Secondary | ICD-10-CM | POA: Insufficient documentation

## 2012-09-28 DIAGNOSIS — Y9229 Other specified public building as the place of occurrence of the external cause: Secondary | ICD-10-CM | POA: Insufficient documentation

## 2012-09-28 DIAGNOSIS — T7840XA Allergy, unspecified, initial encounter: Secondary | ICD-10-CM

## 2012-09-28 DIAGNOSIS — Z8739 Personal history of other diseases of the musculoskeletal system and connective tissue: Secondary | ICD-10-CM | POA: Insufficient documentation

## 2012-09-28 DIAGNOSIS — Z8669 Personal history of other diseases of the nervous system and sense organs: Secondary | ICD-10-CM | POA: Insufficient documentation

## 2012-09-28 DIAGNOSIS — Z8619 Personal history of other infectious and parasitic diseases: Secondary | ICD-10-CM | POA: Insufficient documentation

## 2012-09-28 DIAGNOSIS — L509 Urticaria, unspecified: Secondary | ICD-10-CM | POA: Insufficient documentation

## 2012-09-28 DIAGNOSIS — Z7982 Long term (current) use of aspirin: Secondary | ICD-10-CM | POA: Insufficient documentation

## 2012-09-28 DIAGNOSIS — I10 Essential (primary) hypertension: Secondary | ICD-10-CM | POA: Insufficient documentation

## 2012-09-28 DIAGNOSIS — Z8639 Personal history of other endocrine, nutritional and metabolic disease: Secondary | ICD-10-CM | POA: Insufficient documentation

## 2012-09-28 DIAGNOSIS — G4700 Insomnia, unspecified: Secondary | ICD-10-CM | POA: Insufficient documentation

## 2012-09-28 DIAGNOSIS — Y9389 Activity, other specified: Secondary | ICD-10-CM | POA: Insufficient documentation

## 2012-09-28 DIAGNOSIS — Z79899 Other long term (current) drug therapy: Secondary | ICD-10-CM | POA: Insufficient documentation

## 2012-09-28 DIAGNOSIS — Z8659 Personal history of other mental and behavioral disorders: Secondary | ICD-10-CM | POA: Insufficient documentation

## 2012-09-28 DIAGNOSIS — Z8709 Personal history of other diseases of the respiratory system: Secondary | ICD-10-CM | POA: Insufficient documentation

## 2012-09-28 DIAGNOSIS — Z862 Personal history of diseases of the blood and blood-forming organs and certain disorders involving the immune mechanism: Secondary | ICD-10-CM | POA: Insufficient documentation

## 2012-09-28 DIAGNOSIS — Z872 Personal history of diseases of the skin and subcutaneous tissue: Secondary | ICD-10-CM | POA: Insufficient documentation

## 2012-09-28 DIAGNOSIS — Z8719 Personal history of other diseases of the digestive system: Secondary | ICD-10-CM | POA: Insufficient documentation

## 2012-09-28 DIAGNOSIS — E039 Hypothyroidism, unspecified: Secondary | ICD-10-CM | POA: Insufficient documentation

## 2012-09-28 DIAGNOSIS — Z880 Allergy status to penicillin: Secondary | ICD-10-CM | POA: Insufficient documentation

## 2012-09-28 DIAGNOSIS — F17200 Nicotine dependence, unspecified, uncomplicated: Secondary | ICD-10-CM | POA: Insufficient documentation

## 2012-09-28 DIAGNOSIS — IMO0002 Reserved for concepts with insufficient information to code with codable children: Secondary | ICD-10-CM | POA: Insufficient documentation

## 2012-09-28 DIAGNOSIS — G4710 Hypersomnia, unspecified: Secondary | ICD-10-CM | POA: Insufficient documentation

## 2012-09-28 DIAGNOSIS — Z86718 Personal history of other venous thrombosis and embolism: Secondary | ICD-10-CM | POA: Insufficient documentation

## 2012-09-28 DIAGNOSIS — Z8679 Personal history of other diseases of the circulatory system: Secondary | ICD-10-CM | POA: Insufficient documentation

## 2012-09-28 DIAGNOSIS — E669 Obesity, unspecified: Secondary | ICD-10-CM | POA: Insufficient documentation

## 2012-09-28 DIAGNOSIS — Z95828 Presence of other vascular implants and grafts: Secondary | ICD-10-CM | POA: Insufficient documentation

## 2012-09-28 DIAGNOSIS — L299 Pruritus, unspecified: Secondary | ICD-10-CM | POA: Insufficient documentation

## 2012-09-28 MED ORDER — EPINEPHRINE 0.3 MG/0.3ML IJ SOAJ: 0.3000 mg | INTRAMUSCULAR | Status: DC | PRN

## 2012-09-28 NOTE — Telephone Encounter (Signed)
Patient called and stated that she was in the ER last night for an allergic reaction and needed an extra Epi Pen called in. Patient already has one that they gave her in the hospital. Told patient that she needed an appointment to follow up the hospital visit. Will call back to schedule an appointment.

## 2012-09-28 NOTE — ED Provider Notes (Signed)
History    62 year old female with possible allergic reaction. Patient complaining of a pleuritic rash to her chest, neck and face. Onset about 3 hours ago. Took Benadryl about an hour prior to arrival. She states that her symptoms mildly improved, but not resolve. Denies any pain anywhere. No nausea or vomiting. No diarrhea. No dizziness or lightheadedness. No difficulty breathing or swallowing. No shortness of breath. No wheezing. Patient reports a history of seafood allergy. She was at the beach earlier today. She ate in a restaurant that serves seafood, but she did not eat seafood herself. Her symptoms started on the drive back from the beach. She questions whether it may be seafood related or not. No other exposures that she is aware of. No intervention aside from Benadryl prior to arrival.  CSN: [id number]  Arrival date & time 09/28/12  0020   First MD Initiated Contact with Patient 09/28/12 [phone]      Chief Complaint  Patient presents with  . Allergic Reaction    (Consider location/radiation/quality/duration/timing/severity/associated sxs/prior treatment) HPI  Past Medical History  Diagnosis Date  . Hypersomnia, unspecified   . Other and unspecified hyperlipidemia   . Anxiety state, unspecified   . Abdominal pain, right lower quadrant   . Abdominal pain, epigastric   . Spasm of muscle   . Unspecified tinnitus   . Asymptomatic varicose veins   . Edema   . Rash and other nonspecific skin eruption   . Herpes zoster without mention of complication   . Blood vessel replaced by other means   . Other cataract   . Lumbago   . Insomnia, unspecified   . Pain in limb   . Optic neuritis   . Phlebitis and thrombophlebitis of other deep vessels of lower extremities   . Obesity, unspecified   . Unspecified essential hypertension   . Dizziness and giddiness   . Abnormality of gait   . Disorder of bone and cartilage, unspecified   . Abnormal weight gain   . Other abnormal blood  chemistry   . Abdominal pain, epigastric   . Syncope and collapse   . Palpitations   . Encounter for long-term (current) use of other medications   . Myalgia and myositis, unspecified   . Cramp of limb   . Unspecified hypothyroidism   . Long term (current) use of anticoagulants   . Lumbago   . Muscle weakness (generalized)   . Phlebitis and thrombophlebitis of lower extremities, unspecified   . Tobacco use disorder   . Pain in limb   . Unspecified sinusitis (chronic)   . Pain in joint, site unspecified   . Herpes zoster without mention of complication     Past Surgical History  Procedure Laterality Date  . Right knee  1971  . Right breast biopsy  1986  . [place] filter implant  2008    DR Rubye Oaks  . Lower back  05/26/2007    DR JEFF BEANE  . Hip surgery left  03/2010    DR [facility]  . Hip surgery right  06/2010    DR [facility]  . Left shoulder  2012    DR CHR[facility]     Family History  Problem Relation Age of Onset  . Stroke Mother   . Kidney disease Father   . Stroke Brother     History  Substance Use Topics  . Smoking status: Heavy Tobacco Smoker -- 1.00 packs/day    Types: Cigarettes  . Smokeless tobacco: Not  on file  . Alcohol Use: Yes    OB History   Grav Para Term Preterm Abortions TAB SAB Ect Mult Living                  Review of Systems  All systems reviewed and negative, other than as noted in HPI.   Allergies  Betadine; Calcium channel blockers; Codeine; Cortizone-10; Iodine; Lanolin; Levaquin; Novocain; Other; Peanuts; Penicillins; Pollen extract; Prozac; and Synthroid  Home Medications   Current Outpatient Rx  Name  Route  Sig  Dispense  Refill  . acetaminophen (TYLENOL) 650 MG CR tablet   Oral   Take 650 mg by mouth every 8 (eight) hours as needed for pain. Take one to two tablets every 8 hours as needed         . aspirin 325 MG tablet   Oral   Take 325 mg by mouth daily.          . Levothyroxine Sodium  (TIROSINT) 75 MCG CAPS   Oral   Take 75 mcg by mouth daily before breakfast. Take one tablet once a day         . Melatonin 1 MG TABS   Oral   Take 1 mg by mouth at bedtime as needed (sleep).         . Multiple Vitamin (MULTIVITAMIN WITH MINERALS) TABS   Oral   Take 1 tablet by mouth daily.         Marland Kitchen EPINEPHrine (EPIPEN) 0.3 mg/0.3 mL DEVI   Intramuscular   Inject 0.3 mLs (0.3 mg total) into the muscle as needed.   2 Device   0     BP 143/83  Pulse 86  Temp(Src) 97.5 F (36.4 C) (Oral)  Resp 18  SpO2 96%  Physical Exam  Nursing note and vitals reviewed. Constitutional: She appears well-developed and well-nourished. No distress.  HENT:  Head: Normocephalic and atraumatic.  Eyes: Conjunctivae are normal. Right eye exhibits no discharge. Left eye exhibits no discharge.  Neck: Neck supple.  Cardiovascular: Normal rate, regular rhythm and normal heart sounds.  Exam reveals no gallop and no friction rub.   No murmur heard. Pulmonary/Chest: Effort normal and breath sounds normal. No respiratory distress.  Abdominal: Soft. She exhibits no distension. There is no tenderness.  Musculoskeletal: She exhibits no edema and no tenderness.  Neurological: She is alert.  Skin: Skin is warm and dry. Rash noted.  Faint urticarial rash to her upper chest and lower neck.  Psychiatric: She has a normal mood and affect. Her behavior is normal. Thought content normal.    ED Course  Procedures (including critical care time)  Labs Reviewed - No data to display No results found.   1. Allergic reaction, initial encounter       MDM  61yF with mild allergic reaction. Mild urticarial rash on neck/upper trunk. No evidence of airway compromise or HD instability. I feel safe for DC at this time. Return precautions discussed. Epipens.         Raeford Razor, MD 10/02/12 2106

## 2012-09-28 NOTE — ED Notes (Signed)
Pt in c/o allergic reaction to possibly seafood starting earlier today, first noted on drive home from beach, started as generalized itching, this evening developed into hives on chest and scalp, pt took benadryl 1 hour ago with no relief

## 2012-11-07 ENCOUNTER — Ambulatory Visit (INDEPENDENT_AMBULATORY_CARE_PROVIDER_SITE_OTHER): Payer: Medicare Other | Admitting: Neurology

## 2012-11-07 ENCOUNTER — Encounter: Payer: Self-pay | Admitting: Neurology

## 2012-11-07 VITALS — BP 114/69 | HR 70 | Ht 67.0 in | Wt 188.0 lb

## 2012-11-07 DIAGNOSIS — M25569 Pain in unspecified knee: Secondary | ICD-10-CM

## 2012-11-07 DIAGNOSIS — Z9181 History of falling: Secondary | ICD-10-CM

## 2012-11-07 DIAGNOSIS — G4733 Obstructive sleep apnea (adult) (pediatric): Secondary | ICD-10-CM

## 2012-11-07 DIAGNOSIS — E039 Hypothyroidism, unspecified: Secondary | ICD-10-CM

## 2012-11-07 DIAGNOSIS — I10 Essential (primary) hypertension: Secondary | ICD-10-CM

## 2012-11-07 NOTE — Progress Notes (Signed)
error 

## 2012-11-16 ENCOUNTER — Encounter: Payer: Self-pay | Admitting: Internal Medicine

## 2012-11-16 ENCOUNTER — Ambulatory Visit (INDEPENDENT_AMBULATORY_CARE_PROVIDER_SITE_OTHER): Payer: Medicare Other | Admitting: Internal Medicine

## 2012-11-16 VITALS — BP 128/60 | HR 93 | Temp 99.1°F | Resp 18 | Ht 67.0 in | Wt 191.0 lb

## 2012-11-16 DIAGNOSIS — T7840XD Allergy, unspecified, subsequent encounter: Secondary | ICD-10-CM

## 2012-11-16 DIAGNOSIS — E039 Hypothyroidism, unspecified: Secondary | ICD-10-CM

## 2012-11-16 DIAGNOSIS — G4733 Obstructive sleep apnea (adult) (pediatric): Secondary | ICD-10-CM

## 2012-11-16 DIAGNOSIS — T7840XA Allergy, unspecified, initial encounter: Secondary | ICD-10-CM | POA: Insufficient documentation

## 2012-11-16 DIAGNOSIS — Z5189 Encounter for other specified aftercare: Secondary | ICD-10-CM

## 2012-11-16 DIAGNOSIS — I10 Essential (primary) hypertension: Secondary | ICD-10-CM

## 2012-11-16 MED ORDER — CETIRIZINE HCL 10 MG PO TABS: ORAL_TABLET | ORAL | Status: DC

## 2012-11-16 NOTE — Patient Instructions (Signed)
Try Zyrtec(cetirizine) 10 mg daily to control allergy. Call to schedule the follow up for CPAP titration.

## 2012-11-16 NOTE — Progress Notes (Signed)
Subjective:    Patient ID: [name], female    DOB: [birth year], 63 y.o.   MRN: [id number]  HPI Multiple current concerns:  Back surgery in 2009. Had a piece of skin hanging off the surface. Larey Seat off 2 weeks ago. No pain or hypersensitivity in the scar.  Patient has significant concerns of a rather diffuse swelling. Had swelling of her body at the beach. Also has had swelling in her left leg. Feels slightly swollen now, but not like she was at the beach.  Allergy, subsequent encounter: Trip to the ER for allergic rash. Exact etiology is not certain. She thinks she maybe some shellfish.  Unspecified hypothyroidism, managed by Dr. Talmage Nap  Unspecified essential hypertension: Controlled  Obstructive sleep apnea: She has not rescheduled the CPAP titration. The night that she was supposed to go, she had a pruritic rash that interfered with her reporting for the test. She does continue to put off rescheduling this test.  Stomach swelling. She denies regurgitation, acid reflux, change in bowel movements, or abdominal pain. The swelling will wax and wane. She feels like something is pulling inside of her.    Current Outpatient Prescriptions on File Prior to Visit  Medication Sig Dispense Refill  . acetaminophen (TYLENOL) 650 MG CR tablet Take 650 mg by mouth every 8 (eight) hours as needed for pain. Take one to two tablets every 8 hours as needed      . aspirin 325 MG tablet Take 325 mg by mouth daily.       Marland Kitchen EPINEPHrine (EPIPEN) 0.3 mg/0.3 mL DEVI Inject 0.3 mLs (0.3 mg total) into the muscle as needed.  2 Device  0  . Levothyroxine Sodium (TIROSINT) 75 MCG CAPS Take 75 mcg by mouth daily before breakfast. Take one tablet once a day      . Melatonin 1 MG TABS Take 1 mg by mouth at bedtime as needed (sleep).      . Multiple Vitamin (MULTIVITAMIN WITH MINERALS) TABS Take 1 tablet by mouth daily.       No current facility-administered medications on file prior to visit.     Review of  Systems  Constitutional: Positive for activity change. Negative for chills, diaphoresis, appetite change and fatigue.       Recent diagnosis of obstructive sleep apnea. She will need CPAP.  Eyes: Negative.   Respiratory: Negative.   Cardiovascular: Negative.   Gastrointestinal: Negative.   Endocrine: Negative.   Genitourinary: Negative.   Musculoskeletal:       Patient continues to have chronic back discomfort. He has had chronic issues with her knees for several years. He had a recent fall significantly hurt her left knee.  Skin: Negative.   Hematological:       Patient remains on anticoagulation due to previous history of DVT and pulmonary embolus  Psychiatric/Behavioral: Negative.        Objective:BP 128/60  Pulse 93  Temp(Src) 99.1 F (37.3 C) (Oral)  Resp 18  Ht 5\' 7"  (1.702 m)  Wt 191 lb (86.637 kg)  BMI 29.91 kg/m2  SpO2 98%    Physical Exam  Constitutional: She is oriented to person, place, and time.  Overweight middle-aged female.  HENT:  Head: Normocephalic and atraumatic.  Right Ear: External ear normal.  Left Ear: External ear normal.  Nose: Nose normal.  Eyes: Conjunctivae and EOM are normal. Pupils are equal, round, and reactive to light.  Neck: Normal range of motion. Neck supple. No JVD present. No tracheal deviation  present. No thyromegaly present.  Cardiovascular: Normal rate, regular rhythm, normal heart sounds and intact distal pulses.  Exam reveals no gallop and no friction rub.   No murmur heard. Pulmonary/Chest: Effort normal and breath sounds normal. No respiratory distress. She has no wheezes. She has no rales.  Abdominal: Soft. Bowel sounds are normal. She exhibits no distension and no mass. There is no tenderness.  Musculoskeletal: Normal range of motion. She exhibits edema and tenderness.  Lymphadenopathy:    She has no cervical adenopathy.  Neurological: She is alert and oriented to person, place, and time. She has normal reflexes. No cranial  nerve deficit. Coordination normal.  Skin: Skin is warm and dry. No rash noted. No erythema. No pallor.  Psychiatric: She has a normal mood and affect. Her behavior is normal. Judgment and thought content normal.    No visits with results within 2 Month(s) from this visit. Latest known visit with results is:  Admission on 07/04/2010, Discharged on 07/07/2010  Component Date Value Range Status  . ABO/RH(D) 07/04/2010 O POS   Final  . Antibody Screen 07/04/2010 NEG   Final  . Sample Expiration 07/04/2010 07/07/2010   Final  . WBC 07/05/2010 7.2  4.0 - 10.5 K/uL Final  . RBC 07/05/2010 3.34* 3.87 - 5.11 MIL/uL Final  . Hemoglobin 07/05/2010 8.9* 12.0 - 15.0 g/dL Final  . HCT 47/82/9562 27.7* 36.0 - 46.0 % Final  . MCV 07/05/2010 82.9  78.0 - 100.0 fL Final  . MCH 07/05/2010 26.6  26.0 - 34.0 pg Final  . MCHC 07/05/2010 32.1  30.0 - 36.0 g/dL Final  . RDW 13/11/6576 13.8  11.5 - 15.5 % Final  . Platelets 07/05/2010 190  150 - 400 K/uL Final  . Sodium 07/05/2010 136  135 - 145 mEq/L Final  . Potassium 07/05/2010 3.4* 3.5 - 5.1 mEq/L Final  . Chloride 07/05/2010 105  96 - 112 mEq/L Final  . CO2 07/05/2010 27  19 - 32 mEq/L Final  . Glucose, Bld 07/05/2010 128* 70 - 99 mg/dL Final  . BUN 46/96/2952 5* 6 - 23 mg/dL Final  . Creatinine, Ser 07/05/2010 0.67  0.4 - 1.2 mg/dL Final  . Calcium 84/13/2440 7.6* 8.4 - 10.5 mg/dL Final  . GFR calc non Af Amer 07/05/2010 >60  >60 mL/min Final  . GFR calc Af Amer 07/05/2010   >60 mL/min Final                   Value:>60                                The eGFR has been calculated                         using the MDRD equation.                         This calculation has not been                         validated in all clinical                         situations.                         eGFR's persistently                         <  60 mL/min signify                         possible Chronic Kidney Disease.  Marland Kitchen Prothrombin Time 07/05/2010 14.5  COUMADIN THERAPY  11.6 - 15.2 seconds Final  . INR 07/05/2010 1.11  0.00 - 1.49 Final  . Glucose-Capillary 07/05/2010 126* 70 - 99 mg/dL Final  . Comment 1 16/01/9603 Notify RN   Final  . WBC 07/06/2010 8.1  4.0 - 10.5 K/uL Final  . RBC 07/06/2010 3.04* 3.87 - 5.11 MIL/uL Final  . Hemoglobin 07/06/2010 8.1* 12.0 - 15.0 g/dL Final  . HCT 54/12/8117 25.1* 36.0 - 46.0 % Final  . MCV 07/06/2010 82.6  78.0 - 100.0 fL Final  . MCH 07/06/2010 26.6  26.0 - 34.0 pg Final  . MCHC 07/06/2010 32.3  30.0 - 36.0 g/dL Final  . RDW 14/78/2956 14.1  11.5 - 15.5 % Final  . Platelets 07/06/2010 170  150 - 400 K/uL Final  . Sodium 07/06/2010 137  135 - 145 mEq/L Final  . Potassium 07/06/2010 3.3* 3.5 - 5.1 mEq/L Final  . Chloride 07/06/2010 108  96 - 112 mEq/L Final  . CO2 07/06/2010 25  19 - 32 mEq/L Final  . Glucose, Bld 07/06/2010 124* 70 - 99 mg/dL Final  . BUN 21/30/8657 2* 6 - 23 mg/dL Final  . Creatinine, Ser 07/06/2010 0.55  0.4 - 1.2 mg/dL Final  . Calcium 84/69/6295 7.7* 8.4 - 10.5 mg/dL Final  . GFR calc non Af Amer 07/06/2010 >60  >60 mL/min Final  . GFR calc Af Amer 07/06/2010   >60 mL/min Final                   Value:>60                                The eGFR has been calculated                         using the MDRD equation.                         This calculation has not been                         validated in all clinical                         situations.                         eGFR's persistently                         <60 mL/min signify                         possible Chronic Kidney Disease.  Marland Kitchen Prothrombin Time 07/06/2010 16.0 COUMADIN THERAPY* 11.6 - 15.2 seconds Final  . INR 07/06/2010 1.26  0.00 - 1.49 Final  . WBC 07/07/2010 8.6  4.0 - 10.5 K/uL Final  . RBC 07/07/2010 3.14* 3.87 - 5.11 MIL/uL Final  . Hemoglobin 07/07/2010 8.4* 12.0 - 15.0 g/dL Final  . HCT 28/41/3244 25.8* 36.0 - 46.0 % Final  . MCV 07/07/2010 82.2  78.0 -  100.0 fL Final  . MCH 07/07/2010 26.8   26.0 - 34.0 pg Final  . MCHC 07/07/2010 32.6  30.0 - 36.0 g/dL Final  . RDW 30/86/5784 14.0  11.5 - 15.5 % Final  . Platelets 07/07/2010 202  150 - 400 K/uL Final  . Prothrombin Time 07/07/2010 15.5 COUMADIN THERAPY* 11.6 - 15.2 seconds Final  . INR 07/07/2010 1.21  0.00 - 1.49 Final  . Sodium 07/07/2010 138  135 - 145 mEq/L Final  . Potassium 07/07/2010 3.4* 3.5 - 5.1 mEq/L Final  . Chloride 07/07/2010 107  96 - 112 mEq/L Final  . CO2 07/07/2010 26  19 - 32 mEq/L Final  . Glucose, Bld 07/07/2010 139* 70 - 99 mg/dL Final  . BUN 69/62/9528 6  6 - 23 mg/dL Final  . Creatinine, Ser 07/07/2010 0.60  0.4 - 1.2 mg/dL Final  . Calcium 41/32/4401 8.1* 8.4 - 10.5 mg/dL Final  . GFR calc non Af Amer 07/07/2010 >60  >60 mL/min Final  . GFR calc Af Amer 07/07/2010   >60 mL/min Final                   Value:>60                                The eGFR has been calculated                         using the MDRD equation.                         This calculation has not been                         validated in all clinical                         situations.                         eGFR's persistently                         <60 mL/min signify                         possible Chronic Kidney Disease.  09/29/2011 CBC Wbc 7.1 Rbc 4.86 Hemoglobin 14.1  CMP Glucose 98 Bun 12 Creatinine 0.76  Lipid Panel Cholesterol 209 Triglycerides 121 Hdl 60 Ldl 125  TSH 1.130    09/30/2011 EKG: Rate 72. Sinus rhythm. Normal EKG.    03/28/2012  Lipid: Cholesterol 201, Triglycerides 99, HDL 64, LDL 117 06/24/2012  CBC: wbc 8.0, rbc 4.99, Hemoglobin 14.3 CMP: glucose 106, BUN 13, Creatinine 0.81 TSH: 1.980 Additional lab work has been done at the office of Dr. Dorisann Frames     Assessment & Plan:  Allergy, subsequent encounter: Etiology of this last episode is not clear. Shellfish is suspected. She has complaints of multiple other allergies. I recommended that she try Zyrtec 10 mg daily to try to control the  allergies better. She will also use Benadryl if she has an acute reaction.  Unspecified hypothyroidism: Controlled. Continue to followup with Dr. Talmage Nap.  Unspecified essential hypertension: Controlled  Obstructive sleep apnea: I recommended she continue to followup for CPAP  titration. She continues to have complaints of excessive drowsiness which I think may well been related to her OSA.  I'm unable to give an adequate explanation for her swelling of her body. I do not find anything on physical exam that is helpful in explaining this.

## 2012-11-22 ENCOUNTER — Ambulatory Visit (INDEPENDENT_AMBULATORY_CARE_PROVIDER_SITE_OTHER): Payer: Medicare Other | Admitting: Neurology

## 2012-11-22 ENCOUNTER — Encounter: Payer: Self-pay | Admitting: Neurology

## 2012-11-22 VITALS — BP 134/85 | HR 77 | Ht 67.0 in | Wt 190.0 lb

## 2012-11-22 DIAGNOSIS — I10 Essential (primary) hypertension: Secondary | ICD-10-CM

## 2012-11-22 DIAGNOSIS — R269 Unspecified abnormalities of gait and mobility: Secondary | ICD-10-CM

## 2012-11-22 DIAGNOSIS — Z9181 History of falling: Secondary | ICD-10-CM

## 2012-11-22 NOTE — Progress Notes (Signed)
[facility]  PATIENT: [name] DOB: 05/04/1950  HISTORICAL  [name] is a 62 years old Caucasian female, referred by her primary care physician Dr. Chilton Si, and orthopedic surgeon Dr. Magnus Ivan for evaluation of gait difficulty  She had past medical history of bilateral hip replacement, also had a history of bilateral knee Injury, had surgery at right knee, but no surgery on her left knee,  this was due to a falling injury in 1970s.  In recent few months, she began to experience falling episodes, She has to use her arm to pull herself to get up and down from seated position, she felt that her knee cap was locked, very painful since 2008.   Her legs gave out underneath her sometimes, she denies hip pain, no bilateral feet paresthesia or weakness.   She fell at a wooden door around ester, she fell face down, her knee gave out underneath her.    She is going to receive physical therapy, arranged by Dr. Magnus Ivan.  She also has a history of left optic neuritis in September of 2010, she was seen by Dr. Anne Hahn then, she was treated with high-dose methylprednisolone, and then was on a tapering dose of prednisone until December 2010. Patient noted some red discoloration in the vision of the left eye and then lost vision completely. The patient has had a good return of vision in that eye over time.   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 Patient claims that she underwent a lumbar puncture, but the results of this study, and the blood work was not noted per office visit in Feb 2011 by Dr. Anne Hahn    REVIEW OF SYSTEMS: Full 14 system review of systems performed and notable only for weight gain, chills, fatigue, palpitations, swelling in legs, ringing in ears, eye pain, cough, snoring, incontinence, feeling hot, flushing, joint pain, cramps, allergies, runny nose, skin sensitivities, headaches, numbness, weakness,  tremor, restless legs, not enough sleep, decreased energy.  ALLERGIES: Allergies  Allergen Reactions  . Betadine (Povidone Iodine)   . Calcium Channel Blockers   . Codeine   . Cortizone-10 (Hydrocortisone)   . Iodine   . Lanolin   . Levaquin (Levofloxacin In D5w)   . Novocain (Procaine)   . Other     Beans,carbonation, dust and mold,floride,fried foods, household cleaner, melon,mushrooms,peppers,preservatives,rasisns,spicey,spinach,trees,milk products,shellfish  . Peanuts (Peanut Oil)   . Penicillins   . Pollen Extract   . Prozac (Fluoxetine Hcl)   . Synthroid (Levothyroxine Sodium)     HOME MEDICATIONS: Outpatient Prescriptions Prior to Visit  Medication Sig Dispense Refill  . acetaminophen (TYLENOL) 650 MG CR tablet Take 650 mg by mouth every 8 (eight) hours as needed for pain. Take one to two tablets every 8 hours as needed      . aspirin 325 MG tablet Take 325 mg by mouth daily.       . cetirizine (ZYRTEC) 10 MG tablet One daily to prevent allergy reactions.  30 tablet  11  . EPINEPHrine (EPIPEN) 0.3 mg/0.3 mL DEVI Inject 0.3 mLs (0.3 mg total) into the muscle as needed.  2 Device  0  . Levothyroxine Sodium (TIROSINT) 75 MCG CAPS Take 75 mcg by mouth daily before breakfast. Take one tablet once a day      . Melatonin 1 MG TABS Take 1 mg by mouth at bedtime as needed (sleep).      . Multiple Vitamin (  MULTIVITAMIN WITH MINERALS) TABS Take 1 tablet by mouth daily.       No facility-administered medications prior to visit.    PAST MEDICAL HISTORY: Past Medical History  Diagnosis Date  . Hypersomnia, unspecified   . Anxiety state, unspecified   . Abdominal pain, epigastric   . Spasm of muscle   . Unspecified tinnitus   . Asymptomatic varicose veins   . Edema   . Herpes zoster without mention of complication   . Blood vessel replaced by other means   . cataract   . Lumbago   . Insomnia, unspecified   . Pain in limb   . Optic neuritis left    . Phlebitis and  thrombophlebitis of other deep vessels of lower extremities   . Abnormality of gait   . Disorder of bone and cartilage, unspecified   . Syncope and collapse   . Palpitations   . Myalgia and myositis, unspecified   . Cramp of limb   . Unspecified hypothyroidism   . Long term (current) use of anticoagulants   . Lumbago   . Muscle weakness (generalized)   . Phlebitis and thrombophlebitis of lower extremities, unspecified   . Tobacco use disorder   . Pain in limb   . Unspecified sinusitis (chronic)   . Pain in joint, site unspecified   . Herpes zoster without mention of complication     PAST SURGICAL HISTORY: Past Surgical History  Procedure Laterality Date  . Right knee  1971  . Right breast biopsy  1986  . [place] filter implant  2008    DR Rubye Oaks  . Lower back  05/26/2007    DR JEFF BEANE  . Hip surgery left  03/2010    DR [facility]  . Hip surgery right  06/2010    DR [facility]  . Left shoulder  2012    DR CHRIS BLACKMAN     FAMILY HISTORY: Family History  Problem Relation Age of Onset  . Stroke Mother   . Kidney disease Father   . Stroke Brother     SOCIAL HISTORY:   Social History  . Marital Status: Married    Spouse Name: Leonette Most    Number of Children: 0  . Years of Education: college   Occupational History  . 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 Smoking status: Heavy Tobacco Smoker -- 1.00 packs/day    Types: Cigarettes  . Smokeless tobacco: Never Used     Comment: one pack Daily Maybe more.  . Alcohol Use: 0.6 oz/week    1 Shots of liquor per week  . Drug Use: No  . Sexually Active: No     Comment: husband    Social History Narrative   Patient  Lives at home wit her husband. Patient still works.college education.   Right handed.   Caffiene -None     PHYSICAL EXAM    Filed Vitals:   11/22/12 1514  BP: 134/85  Pulse: 77  Height: 5\' 7"  (1.702 m)  Weight: 190 lb (86.183 kg)      Body mass index is 29.75 kg/(m^2).   Generalized: In no acute distress  Neck: Supple, no carotid bruits   Cardiac: Regular rate rhythm  Pulmonary: Clear to auscultation bilaterally  Musculoskeletal: No deformity  Neurological examination  Mentation: Alert oriented to time, place, history taking, and causual conversation  Cranial nerve II-XII: Pupils were equal round reactive to light extraocular  movements were full, visual field were full on confrontational test. facial sensation and strength were normal. hearing was intact to finger rubbing bilaterally. Uvula tongue midline.  head turning and shoulder shrug and were normal and symmetric.Tongue protrusion into cheek strength was normal.  Motor: normal tone, bulk and strength.  Sensory: Intact to fine touch, pinprick, preserved vibratory sensation, and proprioception at toes.  Coordination: Normal finger to nose, heel-to-shin bilaterally there was no truncal ataxia  Gait:  She walk with an atalgic, cautious, mildly unsteady gait.   Romberg signs: Negative  Deep tendon reflexes: Brachioradialis 2/2, biceps 2/2, triceps 2/2, patellar 0-right knee surgery/1, , Achilles 1/1, plantar responses were flexor bilaterally.   DIAGNOSTIC DATA (LABS, IMAGING, TESTING) - I reviewed patient records, labs, notes, testing and imaging myself where available.  Lab Results  Component Value Date   WBC 8.6 07/07/2010   HGB 8.4* 07/07/2010   HCT 25.8* 07/07/2010   MCV 82.2 07/07/2010   PLT 202 07/07/2010      Component Value Date/Time   NA 138 07/07/2010 0450   K 3.4* 07/07/2010 0450   CL 107 07/07/2010 0450   CO2 26 07/07/2010 0450   GLUCOSE 139* 07/07/2010 0450   BUN 6 07/07/2010 0450   CREATININE 0.60 07/07/2010 0450   CALCIUM 8.1* 07/07/2010 0450   PROT 6.1 03/17/2007 1432   ALBUMIN 3.9 03/17/2007 1432   AST 14 03/17/2007 1432   ALT 13 03/17/2007 1432   ALKPHOS 69 03/17/2007 1432   BILITOT 0.8 03/17/2007 1432   GFRNONAA >60  07/07/2010 0450   GFRAA  Value: >60        The eGFR has been calculated using the MDRD equation. This calculation has not been validated in all clinical situations. eGFR's persistently <60 mL/min signify possible Chronic Kidney Disease. 07/07/2010 0450     ASSESSMENT AND PLAN  62 yo Caucasian female with PMHx of left optic neuritis, only mild abnormal MRI brain scan, also history of bilateral knee pain.  1. Her gait difficulty are most likely due to her knee pathology,  2. Continue PT. 3. RTC in in 3 months, if she continues to have gait difficulty, will repeat MRI brain to see if there is any progression     Levert Feinstein, M.D. Ph.D.  [facility] [address], [address] [place], [place] [address] [phone]

## 2013-01-10 ENCOUNTER — Other Ambulatory Visit: Payer: Self-pay | Admitting: Endocrinology

## 2013-01-10 DIAGNOSIS — E042 Nontoxic multinodular goiter: Secondary | ICD-10-CM

## 2013-01-11 ENCOUNTER — Ambulatory Visit
Admission: RE | Admit: 2013-01-11 | Discharge: 2013-01-11 | Disposition: A | Discharge status: Home / Self Care | Payer: Medicare Other | Source: Ambulatory Visit | Attending: Endocrinology | Admitting: Endocrinology

## 2013-01-11 ENCOUNTER — Other Ambulatory Visit (HOSPITAL_COMMUNITY)
Admission: RE | Admit: 2013-01-11 | Discharge: 2013-01-11 | Disposition: A | Discharge status: Home / Self Care | Payer: Medicare Other | Source: Ambulatory Visit | Attending: Diagnostic Radiology | Admitting: Diagnostic Radiology

## 2013-01-11 DIAGNOSIS — E042 Nontoxic multinodular goiter: Secondary | ICD-10-CM

## 2013-01-11 DIAGNOSIS — E041 Nontoxic single thyroid nodule: Secondary | ICD-10-CM | POA: Insufficient documentation

## 2013-01-24 ENCOUNTER — Ambulatory Visit: Payer: Medicare Other

## 2013-03-07 ENCOUNTER — Ambulatory Visit (INDEPENDENT_AMBULATORY_CARE_PROVIDER_SITE_OTHER): Payer: Medicare Other | Admitting: Internal Medicine

## 2013-03-07 ENCOUNTER — Encounter: Payer: Self-pay | Admitting: Internal Medicine

## 2013-03-07 VITALS — BP 122/78 | HR 81 | Resp 12 | Wt 197.4 lb

## 2013-03-07 DIAGNOSIS — E041 Nontoxic single thyroid nodule: Secondary | ICD-10-CM | POA: Insufficient documentation

## 2013-03-07 DIAGNOSIS — R269 Unspecified abnormalities of gait and mobility: Secondary | ICD-10-CM

## 2013-03-07 DIAGNOSIS — E039 Hypothyroidism, unspecified: Secondary | ICD-10-CM

## 2013-03-07 DIAGNOSIS — I10 Essential (primary) hypertension: Secondary | ICD-10-CM

## 2013-03-07 NOTE — Patient Instructions (Addendum)
Continue current medications. Consider acupuncture or massage therapy for your back pains.

## 2013-03-07 NOTE — Progress Notes (Signed)
Subjective:    Patient ID: [name], female    DOB: 03-Mar-1951, 62 y.o.   MRN: [id number]  Chief Complaint  Patient presents with  . Medical Managment of Chronic Issues    6 month follow-up   . Back Pain    Ongoing back pain     HPI Had a reaction to a medication at the dentist (she does not recall the name). Says she had uncontrolled shaking. Did not go to the hospital. Was fed sugar crackers and seemed to recover. She was told she might have had a low blood sugar reaction.  Had a cold recently. Getting over it now.   Biopsy of the thyroid 01/11/13. Non-neoplastic goiter. Continues to see Dr. Talmage Nap  Pain in her knee.  Continues to have pain in her lateral low back bilaterally. Has had pain ever since her back surgery. Pain is enough to make her stop what she is doing. Not using any OTC pain medications. Has tried OTC pain patches, but they do not help. She would  Like to try acupuncture.  She quit working in 2009 prior to her back surgery.  Current Outpatient Prescriptions on File Prior to Visit  Medication Sig Dispense Refill  . acetaminophen (TYLENOL) 650 MG CR tablet Take 650 mg by mouth every 8 (eight) hours as needed for pain. Take one to two tablets every 8 hours as needed      . aspirin 325 MG tablet Take 325 mg by mouth daily.       . cetirizine (ZYRTEC) 10 MG tablet One daily to prevent allergy reactions.  30 tablet  11  . EPINEPHrine (EPIPEN) 0.3 mg/0.3 mL DEVI Inject 0.3 mLs (0.3 mg total) into the muscle as needed.  2 Device  0  . Melatonin 1 MG TABS Take 1 mg by mouth at bedtime as needed (sleep).      . Multiple Vitamin (MULTIVITAMIN WITH MINERALS) TABS Take 1 tablet by mouth daily.       No current facility-administered medications on file prior to visit.    Review of Systems  Constitutional: Positive for activity change. Negative for chills, diaphoresis, appetite change and fatigue.       Recent diagnosis of obstructive sleep apnea. She will need CPAP.   Eyes: Negative.   Respiratory: Negative.   Cardiovascular: Negative.   Gastrointestinal: Negative.   Endocrine: Negative.   Genitourinary: Negative.   Musculoskeletal:       Patient continues to have chronic back discomfort. He has had chronic issues with her knees for several years. He had a recent fall significantly hurt her left knee.  Skin: Negative.   Hematological:       Patient remains on anticoagulation due to previous history of DVT and pulmonary embolus  Psychiatric/Behavioral: Negative.        Objective:BP 122/78  Pulse 81  Resp 12  Wt 197 lb 6.4 oz (89.54 kg)  SpO2 96%    Physical Exam  Constitutional: She is oriented to person, place, and time.  Overweight middle-aged female.  HENT:  Head: Normocephalic and atraumatic.  Right Ear: External ear normal.  Left Ear: External ear normal.  Nose: Nose normal.  Eyes: Conjunctivae and EOM are normal. Pupils are equal, round, and reactive to light.  Neck: Normal range of motion. Neck supple. No JVD present. No tracheal deviation present. No thyromegaly present.  Cardiovascular: Normal rate, regular rhythm, normal heart sounds and intact distal pulses.  Exam reveals no gallop and no friction rub.  No murmur heard. Pulmonary/Chest: Effort normal and breath sounds normal. No respiratory distress. She has no wheezes. She has no rales.  Abdominal: Soft. Bowel sounds are normal. She exhibits no distension and no mass. There is no tenderness.  Musculoskeletal: Normal range of motion. She exhibits edema and tenderness.  Lymphadenopathy:    She has no cervical adenopathy.  Neurological: She is alert and oriented to person, place, and time. She has normal reflexes. No cranial nerve deficit. Coordination normal.  Skin: Skin is warm and dry. No rash noted. No erythema. No pallor.  Psychiatric: She has a normal mood and affect. Her behavior is normal. Judgment and thought content normal.     No visits with results within 3  Month(s) from this visit. Latest known visit with results is:  Admission on 07/04/2010, Discharged on 07/07/2010  Component Date Value Range Status  . ABO/RH(D) 07/04/2010 O POS   Final  . Antibody Screen 07/04/2010 NEG   Final  . Sample Expiration 07/04/2010 07/07/2010   Final  . WBC 07/05/2010 7.2  4.0 - 10.5 K/uL Final  . RBC 07/05/2010 3.34* 3.87 - 5.11 MIL/uL Final  . Hemoglobin 07/05/2010 8.9* 12.0 - 15.0 g/dL Final  . HCT 16/01/9603 27.7* 36.0 - 46.0 % Final  . MCV 07/05/2010 82.9  78.0 - 100.0 fL Final  . MCH 07/05/2010 26.6  26.0 - 34.0 pg Final  . MCHC 07/05/2010 32.1  30.0 - 36.0 g/dL Final  . RDW 54/12/8117 13.8  11.5 - 15.5 % Final  . Platelets 07/05/2010 190  150 - 400 K/uL Final  . Sodium 07/05/2010 136  135 - 145 mEq/L Final  . Potassium 07/05/2010 3.4* 3.5 - 5.1 mEq/L Final  . Chloride 07/05/2010 105  96 - 112 mEq/L Final  . CO2 07/05/2010 27  19 - 32 mEq/L Final  . Glucose, Bld 07/05/2010 128* 70 - 99 mg/dL Final  . BUN 14/78/2956 5* 6 - 23 mg/dL Final  . Creatinine, Ser 07/05/2010 0.67  0.4 - 1.2 mg/dL Final  . Calcium 21/30/8657 7.6* 8.4 - 10.5 mg/dL Final  . GFR calc non Af Amer 07/05/2010 >60  >60 mL/min Final  . GFR calc Af Amer 07/05/2010   >60 mL/min Final                   Value:>60                                The eGFR has been calculated                         using the MDRD equation.                         This calculation has not been                         validated in all clinical                         situations.                         eGFR's persistently                         <60 mL/min signify  possible Chronic Kidney Disease.  Marland Kitchen Prothrombin Time 07/05/2010 14.5 COUMADIN THERAPY  11.6 - 15.2 seconds Final  . INR 07/05/2010 1.11  0.00 - 1.49 Final  . Glucose-Capillary 07/05/2010 126* 70 - 99 mg/dL Final  . Comment 1 13/11/6576 Notify RN   Final  . WBC 07/06/2010 8.1  4.0 - 10.5 K/uL Final  . RBC 07/06/2010  3.04* 3.87 - 5.11 MIL/uL Final  . Hemoglobin 07/06/2010 8.1* 12.0 - 15.0 g/dL Final  . HCT 46/96/2952 25.1* 36.0 - 46.0 % Final  . MCV 07/06/2010 82.6  78.0 - 100.0 fL Final  . MCH 07/06/2010 26.6  26.0 - 34.0 pg Final  . MCHC 07/06/2010 32.3  30.0 - 36.0 g/dL Final  . RDW 84/13/2440 14.1  11.5 - 15.5 % Final  . Platelets 07/06/2010 170  150 - 400 K/uL Final  . Sodium 07/06/2010 137  135 - 145 mEq/L Final  . Potassium 07/06/2010 3.3* 3.5 - 5.1 mEq/L Final  . Chloride 07/06/2010 108  96 - 112 mEq/L Final  . CO2 07/06/2010 25  19 - 32 mEq/L Final  . Glucose, Bld 07/06/2010 124* 70 - 99 mg/dL Final  . BUN 02/21/2535 2* 6 - 23 mg/dL Final  . Creatinine, Ser 07/06/2010 0.55  0.4 - 1.2 mg/dL Final  . Calcium 64/40/3474 7.7* 8.4 - 10.5 mg/dL Final  . GFR calc non Af Amer 07/06/2010 >60  >60 mL/min Final  . GFR calc Af Amer 07/06/2010   >60 mL/min Final                   Value:>60                                The eGFR has been calculated                         using the MDRD equation.                         This calculation has not been                         validated in all clinical                         situations.                         eGFR's persistently                         <60 mL/min signify                         possible Chronic Kidney Disease.  Marland Kitchen Prothrombin Time 07/06/2010 16.0 COUMADIN THERAPY* 11.6 - 15.2 seconds Final  . INR 07/06/2010 1.26  0.00 - 1.49 Final  . WBC 07/07/2010 8.6  4.0 - 10.5 K/uL Final  . RBC 07/07/2010 3.14* 3.87 - 5.11 MIL/uL Final  . Hemoglobin 07/07/2010 8.4* 12.0 - 15.0 g/dL Final  . HCT 25/95/6387 25.8* 36.0 - 46.0 % Final  . MCV 07/07/2010 82.2  78.0 - 100.0 fL Final  . MCH 07/07/2010 26.8  26.0 - 34.0 pg Final  . MCHC 07/07/2010 32.6  30.0 - 36.0 g/dL Final  .  RDW 07/07/2010 14.0  11.5 - 15.5 % Final  . Platelets 07/07/2010 202  150 - 400 K/uL Final  . Prothrombin Time 07/07/2010 15.5 COUMADIN THERAPY* 11.6 - 15.2 seconds Final  .  INR 07/07/2010 1.21  0.00 - 1.49 Final  . Sodium 07/07/2010 138  135 - 145 mEq/L Final  . Potassium 07/07/2010 3.4* 3.5 - 5.1 mEq/L Final  . Chloride 07/07/2010 107  96 - 112 mEq/L Final  . CO2 07/07/2010 26  19 - 32 mEq/L Final  . Glucose, Bld 07/07/2010 139* 70 - 99 mg/dL Final  . BUN 52/84/1324 6  6 - 23 mg/dL Final  . Creatinine, Ser 07/07/2010 0.60  0.4 - 1.2 mg/dL Final  . Calcium 40/01/2724 8.1* 8.4 - 10.5 mg/dL Final  . GFR calc non Af Amer 07/07/2010 >60  >60 mL/min Final  . GFR calc Af Amer 07/07/2010   >60 mL/min Final                   Value:>60                                The eGFR has been calculated                         using the MDRD equation.                         This calculation has not been                         validated in all clinical                         situations.                         eGFR's persistently                         <60 mL/min signify                         possible Chronic Kidney Disease.        Assessment & Plan:  Abnormality of gait: due to persistent back pain  Plan: continue exercises for your back. Consider acupuncture or massage  Unspecified essential hypertension: controlled  Unspecified hypothyroidism: continue to see Dr. Talmage Nap  Thyroid nodule: biopsied 01/11/13. Non-neoplastic goiter. Multiple nodules present on US thyroid 01/11/13.

## 2013-03-14 ENCOUNTER — Encounter: Payer: Self-pay | Admitting: Neurology

## 2013-03-14 ENCOUNTER — Ambulatory Visit (INDEPENDENT_AMBULATORY_CARE_PROVIDER_SITE_OTHER): Payer: Medicare Other | Admitting: Neurology

## 2013-03-14 VITALS — BP 115/73 | HR 81 | Ht 67.0 in | Wt 200.0 lb

## 2013-03-14 DIAGNOSIS — E785 Hyperlipidemia, unspecified: Secondary | ICD-10-CM

## 2013-03-14 NOTE — Progress Notes (Signed)
[facility]  PATIENT: [name] DOB: 05-16-1950  HISTORICAL  [name] is a 62 years old Caucasian female, referred by her primary care physician Dr. Chilton Si, and orthopedic surgeon Dr. Magnus Ivan for evaluation of gait difficulty  She had past medical history of bilateral hip replacement, also had a history of bilateral knee Injury, had surgery at right knee, but no surgery on her left knee,  this was due to a falling injury in 1970s.  In recent few months, she began to experience falling episodes, She has to use her arm to pull herself to get up and down from seated position, she felt that her knee cap was locked, very painful since 2008.   Her legs gave out underneath her sometimes, she denies hip pain, no bilateral feet paresthesia or weakness.   She fell at a wooden door around [place], she fell face down, her knee gave out underneath her.    She is going to receive physical therapy, arranged by Dr. Magnus Ivan.  She also has a history of left optic neuritis in September of 2010, she was seen by Dr. Anne Hahn then, she was treated with high-dose methylprednisolone, and then was on a tapering dose of prednisone until December 2010. Patient noted some red discoloration in the vision of the left eye and then lost vision completely. The patient has had a good return of vision in that eye over time.   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 Patient claims that she underwent a lumbar puncture, but the results of this study, and the blood work was not noted per office visit in Feb 2011 by Dr. Anne Hahn.  UPDATE 03/14/2013:   She made nice progress with physical therapy, but she continues to complains of bilateral knee pain, low back pain, 7 out of 10, affecting her walking, she denied strokelike symptoms in the past, she has bilateral fingertips, toes paresthesia since her low back decompression surgery in 2009,  REVIEW  OF SYSTEMS: Full 14 system review of systems performed and notable only for weight gain, chills, fatigue, palpitations, swelling in legs, ringing in ears, eye pain, cough, snoring, incontinence, feeling hot, flushing, joint pain, cramps, allergies, runny nose, skin sensitivities, headaches, numbness, weakness, tremor, restless legs, not enough sleep, decreased energy.  ALLERGIES: Allergies  Allergen Reactions  . Betadine [Povidone Iodine]   . Calcium Channel Blockers   . Codeine   . Cortizone-10 [Hydrocortisone]   . Fluoride Preparations   . Iodine   . Lanolin   . Levaquin [Levofloxacin In D5w]   . Novocain [Procaine]   . Other     Beans,carbonation, dust and mold,floride,fried foods, household cleaner, melon,mushrooms,peppers,preservatives,rasisns,spicey,spinach,trees,milk products,shellfish  . Peanuts [Peanut Oil]   . Penicillins   . Pollen Extract   . Prozac [Fluoxetine Hcl]   . Shellfish Allergy   . Synthroid [Levothyroxine Sodium]   . Wellbutrin [Bupropion]     HOME MEDICATIONS: Outpatient Prescriptions Prior to Visit  Medication Sig Dispense Refill  . acetaminophen (TYLENOL) 650 MG CR tablet Take 650 mg by mouth every 8 (eight) hours as needed for pain. Take one to two tablets every 8 hours as needed      . aspirin 325 MG tablet Take 325 mg by mouth daily.       . cetirizine (ZYRTEC) 10 MG tablet One daily to prevent allergy reactions.  30 tablet  11  . clindamycin (CLEOCIN) 150 MG capsule       .  EPINEPHrine (EPIPEN) 0.3 mg/0.3 mL DEVI Inject 0.3 mLs (0.3 mg total) into the muscle as needed.  2 Device  0  . Levothyroxine Sodium (TIROSINT) 88 MCG CAPS Take by mouth daily before breakfast.      . Melatonin 1 MG TABS Take 1 mg by mouth at bedtime as needed (sleep).      . Multiple Vitamin (MULTIVITAMIN WITH MINERALS) TABS Take 1 tablet by mouth daily.       No facility-administered medications prior to visit.    PAST MEDICAL HISTORY: Past Medical History  Diagnosis Date   . Hypersomnia, unspecified   . Anxiety state, unspecified   . Abdominal pain, epigastric   . Spasm of muscle   . Unspecified tinnitus   . Asymptomatic varicose veins   . Edema   . Herpes zoster without mention of complication   . Blood vessel replaced by other means   . cataract   . Lumbago   . Insomnia, unspecified   . Pain in limb   . Optic neuritis left    . Phlebitis and thrombophlebitis of other deep vessels of lower extremities   . Abnormality of gait   . Disorder of bone and cartilage, unspecified   . Syncope and collapse   . Palpitations   . Myalgia and myositis, unspecified   . Cramp of limb   . Unspecified hypothyroidism   . Long term (current) use of anticoagulants   . Lumbago   . Muscle weakness (generalized)   . Phlebitis and thrombophlebitis of lower extremities, unspecified   . Tobacco use disorder   . Pain in limb   . Unspecified sinusitis (chronic)   . Pain in joint, site unspecified   . Herpes zoster without mention of complication     PAST SURGICAL HISTORY: Past Surgical History  Procedure Laterality Date  . Right knee  1971  . Right breast biopsy  1986  . Greenwood filter implant  2008    DR Rubye Oaks  . Lower back  05/26/2007    DR JEFF BEANE  . Hip surgery left  03/2010    DR [facility]  . Hip surgery right  06/2010    DR [facility]  . Left shoulder  2012    DR CHRIS BLACKMAN     FAMILY HISTORY: Family History  Problem Relation Age of Onset  . Stroke Mother   . Kidney disease Father   . Stroke Brother     SOCIAL HISTORY:   Social History  . Marital Status: Married    Spouse Name: Leonette Most    Number of Children: 0  . Years of Education: college   Occupational History  . 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 Smoking status: Heavy Tobacco Smoker -- 1.00 packs/day    Types: Cigarettes  . Smokeless tobacco: Never Used     Comment: one pack Daily Maybe more.  . Alcohol Use: 0.6  oz/week    1 Shots of liquor per week  . Drug Use: No  . Sexually Active: No     Comment: husband    Social History Narrative   Patient  Lives at home wit her husband. Patient still works.college education.   Right handed.   Caffiene -None     PHYSICAL EXAM    Filed Vitals:   03/14/13 1537  BP: 115/73  Pulse: 81  Height: 5\' 7"  (1.702 m)  Weight: 200 lb (90.719 kg)  Body mass index is 31.32 kg/(m^2).   Generalized: In no acute distress  Neck: Supple, no carotid bruits   Cardiac: Regular rate rhythm  Pulmonary: Clear to auscultation bilaterally  Musculoskeletal: No deformity  Neurological examination  Mentation: Alert oriented to time, place, history taking, and causual conversation  Cranial nerve II-XII: Pupils were equal round reactive to light extraocular movements were full, visual field were full on confrontational test. facial sensation and strength were normal. hearing was intact to finger rubbing bilaterally. Uvula tongue midline.  head turning and shoulder shrug and were normal and symmetric.Tongue protrusion into cheek strength was normal.  Motor: normal tone, bulk and strength.  Sensory: Intact to fine touch, pinprick, preserved vibratory sensation, and proprioception at toes.  Coordination: Normal finger to nose, heel-to-shin bilaterally there was no truncal ataxia  Gait:  She walk with an atalgic, cautious, mildly difficulty with tiptoe, heel, and tandem walking,   Romberg signs: Negative  Deep tendon reflexes: Brachioradialis 2/2, biceps 2/2, triceps 2/2, patellar 0-right knee surgery/1, , Achilles 1/1, plantar responses were flexor bilaterally.   DIAGNOSTIC DATA (LABS, IMAGING, TESTING) - I reviewed patient records, labs, notes, testing and imaging myself where available.  Lab Results  Component Value Date   WBC 8.6 07/07/2010   HGB 8.4* 07/07/2010   HCT 25.8* 07/07/2010   MCV 82.2 07/07/2010   PLT 202 07/07/2010      Component Value  Date/Time   NA 138 07/07/2010 0450   K 3.4* 07/07/2010 0450   CL 107 07/07/2010 0450   CO2 26 07/07/2010 0450   GLUCOSE 139* 07/07/2010 0450   BUN 6 07/07/2010 0450   CREATININE 0.60 07/07/2010 0450   CALCIUM 8.1* 07/07/2010 0450   PROT 6.1 03/17/2007 1432   ALBUMIN 3.9 03/17/2007 1432   AST 14 03/17/2007 1432   ALT 13 03/17/2007 1432   ALKPHOS 69 03/17/2007 1432   BILITOT 0.8 03/17/2007 1432   GFRNONAA >60 07/07/2010 0450   GFRAA  Value: >60        The eGFR has been calculated using the MDRD equation. This calculation has not been validated in all clinical situations. eGFR's persistently <60 mL/min signify possible Chronic Kidney Disease. 07/07/2010 0450     ASSESSMENT AND PLAN  62 yo Caucasian female with PMHx of left optic neuritis, only mild abnormal MRI brain scan, also history of bilateral knee pain. Low back pain,  1. Her gait difficulty are most likely due to her knee pain and low back pain. 2. Complete evaluation with MRI brain w/wo with her history of left optic neuritis, abnormal MRI brain. 3. RTC in in 6 months     Levert Feinstein, M.D. Ph.D.  [facility] [address] [place], [place] [address] [phone]

## 2013-03-21 ENCOUNTER — Encounter: Payer: Self-pay | Admitting: Internal Medicine

## 2013-03-27 ENCOUNTER — Ambulatory Visit
Admission: RE | Admit: 2013-03-27 | Discharge: 2013-03-27 | Disposition: A | Discharge status: Home / Self Care | Payer: Medicare Other | Source: Ambulatory Visit | Attending: Neurology | Admitting: Neurology

## 2013-03-27 DIAGNOSIS — R269 Unspecified abnormalities of gait and mobility: Secondary | ICD-10-CM

## 2013-03-27 DIAGNOSIS — E785 Hyperlipidemia, unspecified: Secondary | ICD-10-CM

## 2013-03-27 MED ORDER — GADOBENATE DIMEGLUMINE 529 MG/ML IV SOLN
19.0000 mL | Freq: Once | INTRAVENOUS | Status: AC | PRN
  Administered 2013-03-27: 19 mL via INTRAVENOUS

## 2013-03-28 ENCOUNTER — Other Ambulatory Visit: Payer: Self-pay | Admitting: Family Medicine

## 2013-03-30 ENCOUNTER — Ambulatory Visit (HOSPITAL_COMMUNITY)
Admission: RE | Admit: 2013-03-30 | Discharge: 2013-03-30 | Disposition: A | Discharge status: Home / Self Care | Payer: Medicare Other | Source: Ambulatory Visit | Attending: Orthopaedic Surgery | Admitting: Orthopaedic Surgery

## 2013-03-30 ENCOUNTER — Other Ambulatory Visit (HOSPITAL_COMMUNITY): Payer: Self-pay | Admitting: Orthopaedic Surgery

## 2013-03-30 DIAGNOSIS — M7989 Other specified soft tissue disorders: Secondary | ICD-10-CM

## 2013-03-30 DIAGNOSIS — Z86718 Personal history of other venous thrombosis and embolism: Secondary | ICD-10-CM | POA: Insufficient documentation

## 2013-03-30 DIAGNOSIS — M25569 Pain in unspecified knee: Secondary | ICD-10-CM | POA: Insufficient documentation

## 2013-03-30 DIAGNOSIS — Z7982 Long term (current) use of aspirin: Secondary | ICD-10-CM | POA: Insufficient documentation

## 2013-03-30 DIAGNOSIS — M25562 Pain in left knee: Secondary | ICD-10-CM

## 2013-03-30 NOTE — Progress Notes (Signed)
Left lower extremity venous duplex completed.  Left:  No evidence of DVT, superficial thrombosis, or Baker's cyst.  Right:  Negative for DVT in the common femoral vein.  

## 2013-03-31 ENCOUNTER — Telehealth: Payer: Self-pay | Admitting: Neurology

## 2013-03-31 NOTE — Telephone Encounter (Signed)
Just wants a know when she will get her MRI results. Wants to know if she needs to make an appt or if she will be called

## 2013-04-03 NOTE — Progress Notes (Signed)
Quick Note:  Please call Patient, MRI brain showed age related changes, no acute abnormalities, no change in treatment plan. ______

## 2013-04-03 NOTE — Telephone Encounter (Signed)
Informed patient thru VM that results are not ready yet

## 2013-08-16 ENCOUNTER — Encounter (INDEPENDENT_AMBULATORY_CARE_PROVIDER_SITE_OTHER): Payer: Self-pay

## 2013-08-16 ENCOUNTER — Ambulatory Visit (INDEPENDENT_AMBULATORY_CARE_PROVIDER_SITE_OTHER): Payer: Medicare Other | Admitting: Nurse Practitioner

## 2013-08-16 ENCOUNTER — Encounter: Payer: Self-pay | Admitting: Nurse Practitioner

## 2013-08-16 VITALS — BP 129/78 | HR 83 | Ht 67.0 in | Wt 198.0 lb

## 2013-08-16 DIAGNOSIS — Z9181 History of falling: Secondary | ICD-10-CM

## 2013-08-16 DIAGNOSIS — R269 Unspecified abnormalities of gait and mobility: Secondary | ICD-10-CM

## 2013-08-16 DIAGNOSIS — M25569 Pain in unspecified knee: Secondary | ICD-10-CM

## 2013-08-16 NOTE — Progress Notes (Signed)
[facility]  PATIENT: [name] DOB: [birth year]   REASON FOR VISIT: Gait abnormality, knee pain   HISTORY OF PRESENT ILLNESS: [name], 63 year old female returns for followup. Last seen in this office by Dr. Krista Blue 03/14/2013 for continued low back pain bilateral knee pain with walking. She has not had strokelike symptoms. She has had bilateral fingertips and toes since a low back surgery in 2009. She has had  one fall since last seen. She is not using assistive device. MRI of the brain 03/27/2013 without acute findings, she  has age-related changes. She has no new neurologic complaints. She returns for reevaluation.   REVIEW OF SYSTEMS: Full 14 system review of systems performed and notable only for those listed, all others are neg:  Constitutional: Fatigue Cardiovascular: Palpitations  Ear/Nose/Throat: N/A  Skin: N/A  Eyes: Sensitivity  Respiratory: N/A  Gastroitestinal: N/A  Hematology/Lymphatic: N/A  Endocrine: N/A Musculoskeletal: Joint pain,  walking difficulty, neck pain and stiffness  Allergy/Immunology: N/A Neurological: Numbness in the fingertips and toes, weakness Psychiatric: Anxiety depression  ALLERGIES: Allergies  Allergen Reactions  . Betadine [Povidone Iodine]   . Calcium Channel Blockers   . Codeine   . Cortizone-10 [Hydrocortisone]   . Fluoride Preparations   . Iodine   . Lanolin   . Levaquin [Levofloxacin In D5w]   . Novocain [Procaine]   . Other     Beans,carbonation, dust and mold,floride,fried foods, household cleaner, melon,mushrooms,peppers,preservatives,rasisns,spicey,spinach,trees,milk products,shellfish  . Peanuts [Peanut Oil]   . Penicillins   . Pollen Extract   . Prozac [Fluoxetine Hcl]   . Shellfish Allergy   . Synthroid [Levothyroxine Sodium]   . Wellbutrin [Bupropion]     HOME MEDICATIONS: Outpatient Prescriptions Prior to Visit  Medication Sig Dispense Refill  . acetaminophen (TYLENOL) 650 MG CR tablet  Take 650 mg by mouth every 8 (eight) hours as needed for pain. Take one to two tablets every 8 hours as needed      . aspirin 325 MG tablet Take 325 mg by mouth daily.       Marland Kitchen EPINEPHrine (EPIPEN) 0.3 mg/0.3 mL DEVI Inject 0.3 mLs (0.3 mg total) into the muscle as needed.  2 Device  0  . Levothyroxine Sodium (TIROSINT) 88 MCG CAPS Take by mouth daily before breakfast.      . Melatonin 1 MG TABS Take 1 mg by mouth at bedtime as needed (sleep).      . Multiple Vitamin (MULTIVITAMIN WITH MINERALS) TABS Take 1 tablet by mouth daily.      . CELEBREX 200 MG capsule Take 200 mg by mouth daily.      . cetirizine (ZYRTEC) 10 MG tablet One daily to prevent allergy reactions.  30 tablet  11  . clindamycin (CLEOCIN) 150 MG capsule        No facility-administered medications prior to visit.    PAST MEDICAL HISTORY: Past Medical History  Diagnosis Date  . Hypersomnia, unspecified   . Other and unspecified hyperlipidemia   . Anxiety state, unspecified   . Abdominal pain, right lower quadrant   . Abdominal pain, epigastric   . Spasm of muscle   . Unspecified tinnitus   . Asymptomatic varicose veins   . Edema   . Rash and other nonspecific skin eruption   . Herpes zoster without mention of complication   . Blood vessel replaced by other means   . Other cataract   . Lumbago   . Insomnia, unspecified   . Pain in limb   .  Optic neuritis   . Phlebitis and thrombophlebitis of other deep vessels of lower extremities   . Obesity, unspecified   . Unspecified essential hypertension   . Dizziness and giddiness   . Abnormality of gait   . Disorder of bone and cartilage, unspecified   . Abnormal weight gain   . Other abnormal blood chemistry   . Abdominal pain, epigastric   . Syncope and collapse   . Palpitations   . Encounter for long-term (current) use of other medications   . Myalgia and myositis, unspecified   . Cramp of limb   . Unspecified hypothyroidism   . Long term (current) use of  anticoagulants   . Lumbago   . Muscle weakness (generalized)   . Phlebitis and thrombophlebitis of lower extremities, unspecified   . Tobacco use disorder   . Pain in limb   . Unspecified sinusitis (chronic)   . Pain in joint, site unspecified   . Herpes zoster without mention of complication     PAST SURGICAL HISTORY: Past Surgical History  Procedure Laterality Date  . Right knee  1971  . Right breast biopsy  1986  . Greenwood filter implant  2008    DR Bobette Mo  . Lower back  05/26/2007    DR JEFF BEANE  . Hip surgery left  03/2010    DR [facility]  . Hip surgery right  06/2010    DR [facility]  . Left shoulder  2012    DR CHRIS BLACKMAN     FAMILY HISTORY: Family History  Problem Relation Age of Onset  . Stroke Mother   . Kidney disease Father   . Stroke Brother     SOCIAL HISTORY: History   Social History  . Marital Status: Married    Spouse Name: [name]    Number of Children: 0  . Years of Education: college   Occupational History  .      answers phones   Social History Main Topics  . Smoking status: Heavy Tobacco Smoker -- 1.00 packs/day    Types: Cigarettes  . Smokeless tobacco: Never Used     Comment: one pack Daily Maybe more.  . Alcohol Use: 0.6 oz/week    1 Shots of liquor per week  . Drug Use: No  . Sexual Activity: No     Comment: husband   Other Topics Concern  . Not on file   Social History Narrative   Patient  Lives at home wit her husband [name]) . Patient still works.college education.   Right handed.   Caffiene -None     PHYSICAL EXAM  Filed Vitals:   08/16/13 1416  BP: 129/78  Pulse: 83  Height: 5\' 7"  (1.702 m)  Weight: 198 lb (89.812 kg)   Body mass index is 31 kg/(m^2).  Generalized: Well developed, in no acute distress  Head: normocephalic and atraumatic,. Oropharynx benign  Neck: Supple, no carotid bruits  Cardiac: Regular rate rhythm, no murmur  Musculoskeletal: No deformity   Neurological  examination   Mentation: Alert oriented to time, place, history taking. Follows all commands speech and language fluent  Cranial nerve II-XII: Pupils were equal round reactive to light extraocular movements were full, visual field were full on confrontational test. Facial sensation and strength were normal. hearing was intact to finger rubbing bilaterally. Uvula tongue midline. head turning and shoulder shrug were normal and symmetric.Tongue protrusion into cheek strength was normal. Motor: normal bulk and tone, full strength in the BUE, BLE,  fine finger movements normal, no pronator drift. No focal weakness Sensory: normal and symmetric to light touch, pinprick, and  vibration  Coordination: finger-nose-finger, heel-to-shin bilaterally, no dysmetria Reflexes: Brachioradialis 2/2, biceps 2/2, triceps 2/2, patellar 0 right /1 left, Achilles1/1, plantar responses were flexor bilaterally. Gait and Station: Rising up from seated position without assistance, normal stance,  moderate stride, good arm swing, smooth turning, able to perform tiptoe, and heel walking without difficulty. Tandem gait is mildly unsteady  DIAGNOSTIC DATA (LABS, IMAGING, TESTING) - ASSESSMENT AND PLAN  63 y.o. year old female  has a past medical history of left optic neuritis, gait abnormality, history of bilateral knee pain low back pain  MRI of the brain 03/27/2013 without acute findings, she  has age-related changes.  Moderate exercise to maintain flexibility Her gait difficulties more likely due to knee pain and low back pain Be careful with ambulation  Followup yearly and when necessary Dennie Bible, [facility], [facility], APRN  [facility] [address], [place], [place] [address] [phone]

## 2013-08-16 NOTE — Patient Instructions (Signed)
Moderate exercise to maintain flexibility Be careful with ambulation  Followup yearly and when necessary

## 2013-08-23 ENCOUNTER — Ambulatory Visit (INDEPENDENT_AMBULATORY_CARE_PROVIDER_SITE_OTHER): Payer: Medicare Other | Admitting: Internal Medicine

## 2013-08-23 ENCOUNTER — Encounter: Payer: Self-pay | Admitting: Internal Medicine

## 2013-08-23 ENCOUNTER — Ambulatory Visit
Admission: RE | Admit: 2013-08-23 | Discharge: 2013-08-23 | Disposition: A | Discharge status: Home / Self Care | Payer: Medicare Other | Source: Ambulatory Visit | Attending: Internal Medicine | Admitting: Internal Medicine

## 2013-08-23 VITALS — BP 126/80 | HR 78 | Temp 97.8°F | Wt 197.2 lb

## 2013-08-23 DIAGNOSIS — J209 Acute bronchitis, unspecified: Secondary | ICD-10-CM

## 2013-08-23 DIAGNOSIS — R059 Cough, unspecified: Secondary | ICD-10-CM

## 2013-08-23 DIAGNOSIS — IMO0001 Reserved for inherently not codable concepts without codable children: Secondary | ICD-10-CM

## 2013-08-23 DIAGNOSIS — R053 Chronic cough: Secondary | ICD-10-CM | POA: Insufficient documentation

## 2013-08-23 DIAGNOSIS — R05 Cough: Secondary | ICD-10-CM

## 2013-08-23 DIAGNOSIS — T466X5A Adverse effect of antihyperlipidemic and antiarteriosclerotic drugs, initial encounter: Secondary | ICD-10-CM | POA: Insufficient documentation

## 2013-08-23 NOTE — Progress Notes (Signed)
Patient ID: [name], female   DOB: 12/12/1950, 63 y.o.   MRN: [id number]    Location:  [facility]   Place of Service: OFFICE    Allergies  Allergen Reactions  . Betadine [Povidone Iodine]   . Calcium Channel Blockers   . Codeine   . Cortizone-10 [Hydrocortisone]   . Fluoride Preparations   . Iodine   . Lanolin   . Levaquin [Levofloxacin In D5w]   . Novocain [Procaine]   . Other     Beans,carbonation, dust and mold,floride,fried foods, household cleaner, melon,mushrooms,peppers,preservatives,rasisns,spicey,spinach,trees,milk products,shellfish  . Peanuts [Peanut Oil]   . Penicillins   . Pollen Extract   . Prozac [Fluoxetine Hcl]   . Shellfish Allergy   . Synthroid [Levothyroxine Sodium]   . Wellbutrin [Bupropion]     Chief Complaint  Patient presents with  . Acute Visit    head congestion, fatigue, cough with clear x 1 month.  seen a doc-in- the- box 06/28/13 with no relief.    Marland Kitchen other    pain in both hands and rash that comes & goes. taking Claritin, ibuprofen   . other    never had colonoscopy, pap done 15-20 yrs ago; declines Tdap    HPI:  Symptoms started in feb 2015. Went to Urgent Care. Was given levofloxacin 750 mg for 20 days. Began having pain in hands, arms, and feet. Started with rash on hands and feet (patches of skin that started slitting). Was given a cream that helped, triamcinolone acetonide 1%.   Having a rash at neck that is different that started about a week ago.   Continues to have generalized myalgias.    Medications: Patient's Medications  New Prescriptions   No medications on file  Previous Medications   ACETAMINOPHEN (TYLENOL) 650 MG CR TABLET    Take 650 mg by mouth every 8 (eight) hours as needed for pain. Take one to two tablets every 8 hours as needed   ASPIRIN 325 MG TABLET    Take 325 mg by mouth daily.    EPINEPHRINE (EPIPEN) 0.3 MG/0.3 ML DEVI    Inject 0.3 mLs (0.3 mg total) into the muscle as needed.   LEVOTHYROXINE SODIUM  (TIROSINT) 88 MCG CAPS    Take by mouth daily before breakfast.   MULTIPLE VITAMIN (MULTIVITAMIN WITH MINERALS) TABS    Take 1 tablet by mouth daily.   TRIAMCINOLONE CREAM (KENALOG) 0.1 %    Apply 1 application topically 2 (two) times daily.  Modified Medications   No medications on file  Discontinued Medications   MELATONIN 1 MG TABS    Take 1 mg by mouth at bedtime as needed (sleep).     Review of Systems  Constitutional: Positive for activity change. Negative for chills, diaphoresis, appetite change and fatigue.       Recent diagnosis of obstructive sleep apnea. She will need CPAP.  Eyes: Negative.   Respiratory: Negative.   Cardiovascular: Negative.   Gastrointestinal: Negative.   Endocrine: Negative.   Genitourinary: Negative.   Musculoskeletal:       Patient continues to have chronic back discomfort. He has had chronic issues with her knees for several years. He had a recent fall significantly hurt her left knee.  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     Filed Vitals:   08/23/13 1235  BP:  126/80  Pulse: 78  Temp: 97.8 F (36.6 C)  TempSrc: Oral  Weight: 197 lb 3.2 oz (89.449 kg)  SpO2: 94%   Physical Exam  Constitutional: She is oriented to person, place, and time.  Overweight middle-aged female.  HENT:  Head: Normocephalic and atraumatic.  Right Ear: External ear normal.  Left Ear: External ear normal.  Nose: Nose normal.  Eyes: Conjunctivae and EOM are normal. Pupils are equal, round, and reactive to light.  Neck: Normal range of motion. Neck supple. No JVD present. No tracheal deviation present. No thyromegaly present.  Cardiovascular: Normal rate, regular rhythm, normal heart sounds and intact distal pulses.  Exam reveals no gallop and no friction rub.   No murmur heard. Pulmonary/Chest: Effort normal and breath sounds  normal. No respiratory distress. She has no wheezes. She has no rales.  Abdominal: Soft. Bowel sounds are normal. She exhibits no distension and no mass. There is no tenderness.  Musculoskeletal: Normal range of motion. She exhibits edema and tenderness.  Lymphadenopathy:    She has no cervical adenopathy.  Neurological: She is alert and oriented to person, place, and time. She has normal reflexes. No cranial nerve deficit. Coordination normal.  Skin: Skin is warm and dry. No rash (uunable to see any rash today) noted. No erythema. No pallor.  Psychiatric: She has a normal mood and affect. Her behavior is normal. Judgment and thought content normal.     Labs reviewed: No visits with results within 3 Month(s) from this visit. Latest known visit with results is:  Admission on 07/04/2010, Discharged on 07/07/2010  Component Date Value Ref Range Status  . ABO/RH(D) 07/04/2010 O POS   Final  . Antibody Screen 07/04/2010 NEG   Final  . Sample Expiration 07/04/2010 07/07/2010   Final  . WBC 07/05/2010 7.2  4.0 - 10.5 K/uL Final  . RBC 07/05/2010 3.34* 3.87 - 5.11 MIL/uL Final  . Hemoglobin 07/05/2010 8.9* 12.0 - 15.0 g/dL Final  . HCT 07/05/2010 27.7* 36.0 - 46.0 % Final  . MCV 07/05/2010 82.9  78.0 - 100.0 fL Final  . MCH 07/05/2010 26.6  26.0 - 34.0 pg Final  . MCHC 07/05/2010 32.1  30.0 - 36.0 g/dL Final  . RDW 07/05/2010 13.8  11.5 - 15.5 % Final  . Platelets 07/05/2010 190  150 - 400 K/uL Final  . Sodium 07/05/2010 136  135 - 145 mEq/L Final  . Potassium 07/05/2010 3.4* 3.5 - 5.1 mEq/L Final  . Chloride 07/05/2010 105  96 - 112 mEq/L Final  . CO2 07/05/2010 27  19 - 32 mEq/L Final  . Glucose, Bld 07/05/2010 128* 70 - 99 mg/dL Final  . BUN 07/05/2010 5* 6 - 23 mg/dL Final  . Creatinine, Ser 07/05/2010 0.67  0.4 - 1.2 mg/dL Final  . Calcium 07/05/2010 7.6* 8.4 - 10.5 mg/dL Final  . GFR calc non Af Amer 07/05/2010 >60  >60 mL/min Final  . GFR calc Af Amer 07/05/2010   >60 mL/min  Final                   Value:>60                                The eGFR has been calculated                         using the MDRD equation.  This calculation has not been                         validated in all clinical                         situations.                         eGFR's persistently                         <60 mL/min signify                         possible Chronic Kidney Disease.  Marland Kitchen Prothrombin Time 07/05/2010 14.5 COUMADIN THERAPY  11.6 - 15.2 seconds Final  . INR 07/05/2010 1.11  0.00 - 1.49 Final  . Glucose-Capillary 07/05/2010 126* 70 - 99 mg/dL Final  . Comment 1 07/05/2010 Notify RN   Final  . WBC 07/06/2010 8.1  4.0 - 10.5 K/uL Final  . RBC 07/06/2010 3.04* 3.87 - 5.11 MIL/uL Final  . Hemoglobin 07/06/2010 8.1* 12.0 - 15.0 g/dL Final  . HCT 07/06/2010 25.1* 36.0 - 46.0 % Final  . MCV 07/06/2010 82.6  78.0 - 100.0 fL Final  . MCH 07/06/2010 26.6  26.0 - 34.0 pg Final  . MCHC 07/06/2010 32.3  30.0 - 36.0 g/dL Final  . RDW 07/06/2010 14.1  11.5 - 15.5 % Final  . Platelets 07/06/2010 170  150 - 400 K/uL Final  . Sodium 07/06/2010 137  135 - 145 mEq/L Final  . Potassium 07/06/2010 3.3* 3.5 - 5.1 mEq/L Final  . Chloride 07/06/2010 108  96 - 112 mEq/L Final  . CO2 07/06/2010 25  19 - 32 mEq/L Final  . Glucose, Bld 07/06/2010 124* 70 - 99 mg/dL Final  . BUN 07/06/2010 2* 6 - 23 mg/dL Final  . Creatinine, Ser 07/06/2010 0.55  0.4 - 1.2 mg/dL Final  . Calcium 07/06/2010 7.7* 8.4 - 10.5 mg/dL Final  . GFR calc non Af Amer 07/06/2010 >60  >60 mL/min Final  . GFR calc Af Amer 07/06/2010   >60 mL/min Final                   Value:>60                                The eGFR has been calculated                         using the MDRD equation.                         This calculation has not been                         validated in all clinical                         situations.                         eGFR's persistently                          <  60 mL/min signify                         possible Chronic Kidney Disease.  Marland Kitchen Prothrombin Time 07/06/2010 16.0 COUMADIN THERAPY* 11.6 - 15.2 seconds Final  . INR 07/06/2010 1.26  0.00 - 1.49 Final  . WBC 07/07/2010 8.6  4.0 - 10.5 K/uL Final  . RBC 07/07/2010 3.14* 3.87 - 5.11 MIL/uL Final  . Hemoglobin 07/07/2010 8.4* 12.0 - 15.0 g/dL Final  . HCT 07/07/2010 25.8* 36.0 - 46.0 % Final  . MCV 07/07/2010 82.2  78.0 - 100.0 fL Final  . MCH 07/07/2010 26.8  26.0 - 34.0 pg Final  . MCHC 07/07/2010 32.6  30.0 - 36.0 g/dL Final  . RDW 07/07/2010 14.0  11.5 - 15.5 % Final  . Platelets 07/07/2010 202  150 - 400 K/uL Final  . Prothrombin Time 07/07/2010 15.5 COUMADIN THERAPY* 11.6 - 15.2 seconds Final  . INR 07/07/2010 1.21  0.00 - 1.49 Final  . Sodium 07/07/2010 138  135 - 145 mEq/L Final  . Potassium 07/07/2010 3.4* 3.5 - 5.1 mEq/L Final  . Chloride 07/07/2010 107  96 - 112 mEq/L Final  . CO2 07/07/2010 26  19 - 32 mEq/L Final  . Glucose, Bld 07/07/2010 139* 70 - 99 mg/dL Final  . BUN 07/07/2010 6  6 - 23 mg/dL Final  . Creatinine, Ser 07/07/2010 0.60  0.4 - 1.2 mg/dL Final  . Calcium 07/07/2010 8.1* 8.4 - 10.5 mg/dL Final  . GFR calc non Af Amer 07/07/2010 >60  >60 mL/min Final  . GFR calc Af Amer 07/07/2010   >60 mL/min Final                   Value:>60                                The eGFR has been calculated                         using the MDRD equation.                         This calculation has not been                         validated in all clinical                         situations.                         eGFR's persistently                         <60 mL/min signify                         possible Chronic Kidney Disease.      Assessment/Plan  1. Acute bronchitis Lingering and likely viral at onset - CBC With differential/Platelet - CMP - DG Chest 2 View; Future  2. Myalgia and myositis - CMP - CK  3. Cough Persistent. To get CXR.

## 2013-08-23 NOTE — Patient Instructions (Signed)
We will call you about the results of the xray and lab.

## 2013-08-24 LAB — COMPREHENSIVE METABOLIC PANEL
ALBUMIN: 4.3 g/dL (ref 3.6–4.8)
ALT: 7 IU/L (ref 0–32)
AST: 12 IU/L (ref 0–40)
Albumin/Globulin Ratio: 2.3 (ref 1.1–2.5)
Alkaline Phosphatase: 86 IU/L (ref 39–117)
BILIRUBIN TOTAL: 0.4 mg/dL (ref 0.0–1.2)
BUN / CREAT RATIO: 14 (ref 11–26)
BUN: 10 mg/dL (ref 8–27)
CO2: 23 mmol/L (ref 18–29)
Calcium: 9.2 mg/dL (ref 8.7–10.3)
Chloride: 104 mmol/L (ref 97–108)
Creatinine, Ser: 0.7 mg/dL (ref 0.57–1.00)
GFR calc non Af Amer: 93 mL/min/{1.73_m2} (ref >59)
GFR, EST AFRICAN AMERICAN: 107 mL/min/{1.73_m2} (ref >59)
Globulin, Total: 1.9 g/dL (ref 1.5–4.5)
Glucose: 91 mg/dL (ref 65–99)
POTASSIUM: 4.5 mmol/L (ref 3.5–5.2)
SODIUM: 144 mmol/L (ref 134–144)
Total Protein: 6.2 g/dL (ref 6.0–8.5)

## 2013-08-24 LAB — CBC WITH DIFFERENTIAL
BASOS ABS: 0.1 10*3/uL (ref 0.0–0.2)
Basos: 1 %
EOS: 4 %
Eosinophils Absolute: 0.3 10*3/uL (ref 0.0–0.4)
HEMATOCRIT: 41 % (ref 34.0–46.6)
Hemoglobin: 14.1 g/dL (ref 11.1–15.9)
Immature Grans (Abs): 0 10*3/uL (ref 0.0–0.1)
Immature Granulocytes: 0 %
Lymphocytes Absolute: 2.5 10*3/uL (ref 0.7–3.1)
Lymphs: 34 %
MCH: 27.4 pg (ref 26.6–33.0)
MCHC: 34.4 g/dL (ref 31.5–35.7)
MCV: 80 fL (ref 79–97)
MONOCYTES: 8 %
Monocytes Absolute: 0.6 10*3/uL (ref 0.1–0.9)
NEUTROS ABS: 3.9 10*3/uL (ref 1.4–7.0)
Neutrophils Relative %: 53 %
Platelets: 249 10*3/uL (ref 150–379)
RBC: 5.14 x10E6/uL (ref 3.77–5.28)
RDW: 14.6 % (ref 12.3–15.4)
WBC: 7.3 10*3/uL (ref 3.4–10.8)

## 2013-08-24 LAB — CK: CK TOTAL: 28 U/L (ref 24–173)

## 2013-10-26 ENCOUNTER — Other Ambulatory Visit: Payer: Self-pay | Admitting: Endocrinology

## 2013-10-26 DIAGNOSIS — E010 Iodine-deficiency related diffuse (endemic) goiter: Secondary | ICD-10-CM

## 2013-11-09 ENCOUNTER — Telehealth: Payer: Self-pay | Admitting: *Deleted

## 2013-11-09 NOTE — Telephone Encounter (Signed)
Patient called wanting to know if it would be ok to take Vitamin B12. Please Advise.

## 2013-11-11 NOTE — Telephone Encounter (Signed)
This is OK

## 2013-11-13 NOTE — Telephone Encounter (Signed)
Patient Notified

## 2013-12-05 ENCOUNTER — Encounter: Payer: Self-pay | Admitting: Nurse Practitioner

## 2013-12-10 ENCOUNTER — Emergency Department (HOSPITAL_COMMUNITY)
Admission: EM | Admit: 2013-12-10 | Discharge: 2013-12-10 | Disposition: A | Discharge status: Home / Self Care | Payer: Medicare Other | Source: Home / Self Care | Attending: Emergency Medicine | Admitting: Emergency Medicine

## 2013-12-10 ENCOUNTER — Encounter (HOSPITAL_COMMUNITY): Payer: Self-pay | Admitting: Emergency Medicine

## 2013-12-10 DIAGNOSIS — J020 Streptococcal pharyngitis: Secondary | ICD-10-CM

## 2013-12-10 MED ORDER — CLINDAMYCIN HCL 300 MG PO CAPS: 300.0000 mg | ORAL_CAPSULE | Freq: Four times a day (QID) | ORAL | Status: DC

## 2013-12-10 NOTE — Discharge Instructions (Signed)

## 2013-12-10 NOTE — ED Notes (Signed)
Here w husband , who has tested positive for strep, concerned she may also have strep.

## 2013-12-10 NOTE — ED Provider Notes (Signed)
  Chief Complaint   Chief Complaint  Patient presents with  . Sore Throat    History of Present Illness   [name] is a 63 year old female who's had a one-week history of sore throat, and a long-standing history of nasal congestion cough. Her husband is in today for sore throat and it turned out that he had strep throat. They've been exposed to some grandchildren with respiratory illnesses. She does not have a prior history of strep throat.   Review of Systems   Other than as noted above, the patient denies any of the following symptoms. Systemic:  No fever, chills, sweats, myalgias, or headache. Eye:  No redness, pain or drainage. ENT:  No earache, nasal congestion, sneezing, rhinorrhea, sinus pressure, sinus pain, or post nasal drip. Lungs:  No cough, sputum production, wheezing, shortness of breath, or chest pain. GI:  No abdominal pain, nausea, vomiting, or diarrhea. Skin:  No rash.  [name]   Past medical history, family history, social history, meds, and allergies were reviewed. She is allergic to multiple meds including penicillins, azithromycin, Betadine, calcium channel blockers, codeine, cortisone 10, chloride preparations, iodine, lanolin, Levaquin, Novocain, peanuts, pollens, shellfish, Synthroid, and Wellbutrin. Right now she's taking aspirin, thyroid supplement and a multivitamin. She also has asthma.  Physical Exam     Vital signs:  BP 148/59  Pulse 72  Temp(Src) 98.2 F (36.8 C) (Oral)  SpO2 97% General:  Alert, in no distress. Phonation was normal, no drooling, and patient was able to handle secretions well.  Eye:  No conjunctival injection or drainage. Lids were normal. ENT:  TMs and canals were normal, without erythema or inflammation.  Nasal mucosa was clear and uncongested, without drainage.  Mucous membranes were moist.  Exam of pharynx was normal with no erythema, swelling, exudate, or ulcerations.  There were no oral ulcerations or lesions. There was no  bulging of the tonsillar pillars, and the uvula was midline. Neck:  Supple, no adenopathy, tenderness or mass. Lungs:  No respiratory distress.  Lungs were clear to auscultation, without wheezes, rales or rhonchi.  Breath sounds were clear and equal bilaterally.  Heart:  Regular rhythm, without gallops, murmers or rubs. Skin:  Clear, warm, and dry, without rash or lesions.  Labs   Rapid strep antigen was positive.  Assessment   The encounter diagnosis was Strep throat.  There is no evidence of a peritonsillar abscess, retropharyngeal abscess, or epiglottitis.   Plan     1.  Meds:  The following meds were prescribed:   New Prescriptions   CLINDAMYCIN (CLEOCIN) 300 MG CAPSULE    Take 1 capsule (300 mg total) by mouth 4 (four) times daily.    2.  Patient Education/Counseling:  The patient was given appropriate handouts, self care instructions, and instructed in symptomatic relief, including hot saline gargles, throat lozenges, infectious precautions, and need to trade out toothbrush.    3.  Follow up:  The patient was told to follow up here if no better in 3 to 4 days, or sooner if becoming worse in any way, and given some red flag symptoms such as difficulty swallowing or breathing which would prompt immediate return.      Lindsey Mo, MD 12/10/13 [phone]

## 2013-12-15 ENCOUNTER — Emergency Department (INDEPENDENT_AMBULATORY_CARE_PROVIDER_SITE_OTHER)
Admission: EM | Admit: 2013-12-15 | Discharge: 2013-12-15 | Disposition: A | Discharge status: Home / Self Care | Payer: Medicare Other | Source: Home / Self Care | Attending: Family Medicine | Admitting: Family Medicine

## 2013-12-15 ENCOUNTER — Encounter (HOSPITAL_COMMUNITY): Payer: Self-pay | Admitting: Emergency Medicine

## 2013-12-15 DIAGNOSIS — J020 Streptococcal pharyngitis: Secondary | ICD-10-CM

## 2013-12-15 DIAGNOSIS — Z720 Tobacco use: Secondary | ICD-10-CM

## 2013-12-15 DIAGNOSIS — F17200 Nicotine dependence, unspecified, uncomplicated: Secondary | ICD-10-CM

## 2013-12-15 LAB — POCT INFECTIOUS MONO SCREEN: MONO SCREEN: NEGATIVE

## 2013-12-15 MED ORDER — PREDNISONE 50 MG PO TABS: ORAL_TABLET | ORAL | Status: DC

## 2013-12-15 MED ORDER — FLUTICASONE PROPIONATE 50 MCG/ACT NA SUSP: 1.0000 | Freq: Every day | NASAL | Status: DC

## 2013-12-15 NOTE — Discharge Instructions (Signed)
You have Strep Throat. THis may take several more days to resolve You do not have Mono Please start the prednisone for inflammation and pain Please try to cut back on the smoking as this is adding chemical irritation to your throat infection Start using flonase every night before bed and an allergy pill during the day. Post nasal drip from nasal irritation and allergies is also likely causing some of your throat irritation.  Strep Throat Strep throat is an infection of the throat caused by a bacteria named Streptococcus pyogenes. Your health care provider may call the infection streptococcal "tonsillitis" or "pharyngitis" depending on whether there are signs of inflammation in the tonsils or back of the throat. Strep throat is most common in children aged 5-15 years during the cold months of the year, but it can occur in people of any age during any season. This infection is spread from person to person (contagious) through coughing, sneezing, or other close contact. SIGNS AND SYMPTOMS   Fever or chills.  Painful, swollen, red tonsils or throat.  Pain or difficulty when swallowing.  White or yellow spots on the tonsils or throat.  Swollen, tender lymph nodes or "glands" of the neck or under the jaw.  Red rash all over the body (rare). DIAGNOSIS  Many different infections can cause the same symptoms. A test must be done to confirm the diagnosis so the right treatment can be given. A "rapid strep test" can help your health care provider make the diagnosis in a few minutes. If this test is not available, a light swab of the infected area can be used for a throat culture test. If a throat culture test is done, results are usually available in a day or two. TREATMENT  Strep throat is treated with antibiotic medicine. HOME CARE INSTRUCTIONS   Gargle with 1 tsp of salt in 1 cup of warm water, 3-4 times per day or as needed for comfort.  Family members who also have a sore throat or fever should  be tested for strep throat and treated with antibiotics if they have the strep infection.  Make sure everyone in your household washes their hands well.  Do not share food, drinking cups, or personal items that could cause the infection to spread to others.  You may need to eat a soft food diet until your sore throat gets better.  Drink enough water and fluids to keep your urine clear or pale yellow. This will help prevent dehydration.  Get plenty of rest.  Stay home from school, day care, or work until you have been on antibiotics for 24 hours.  Take medicines only as directed by your health care provider.  Take your antibiotic medicine as directed by your health care provider. Finish it even if you start to feel better. SEEK MEDICAL CARE IF:   The glands in your neck continue to enlarge.  You develop a rash, cough, or earache.  You cough up green, yellow-brown, or bloody sputum.  You have pain or discomfort not controlled by medicines.  Your problems seem to be getting worse rather than better.  You have a fever. SEEK IMMEDIATE MEDICAL CARE IF:   You develop any new symptoms such as vomiting, severe headache, stiff or painful neck, chest pain, shortness of breath, or trouble swallowing.  You develop severe throat pain, drooling, or changes in your voice.  You develop swelling of the neck, or the skin on the neck becomes red and tender.  You develop signs  of dehydration, such as fatigue, dry mouth, and decreased urination.  You become increasingly sleepy, or you cannot wake up completely. MAKE SURE YOU:  Understand these instructions.  Will watch your condition.  Will get help right away if you are not doing well or get worse. Document Released: 04/10/2000 Document Revised: 08/28/2013 Document Reviewed: 06/12/2010 [facility] Patient Information 2015 [place], [place]. This information is not intended to replace advice given to you by your health care provider. Make sure  you discuss any questions you have with your health care provider.

## 2013-12-15 NOTE — ED Provider Notes (Signed)
CSN: [id number]     Arrival date & time 12/15/13  1914 History   First MD Initiated Contact with Patient 12/15/13 2024     Chief Complaint  Patient presents with  . Sore Throat   (Consider location/radiation/quality/duration/timing/severity/associated sxs/prior Treatment) Patient is a 63 y.o. female presenting with pharyngitis.  Sore Throat    Dx w/ strep throat on Aug16. Started on [place]. Only mild improvement overall but still w/ sore thorat, fatigue adn HA. Associated w/ rinorrhea adn cough. Continues to smoke 2ppd. H/o allergies. Also developed mild red non itchy non painful rash on face. Husband states he could never see the rash.  Denies CP, SOB, dizziness.      Past Medical History  Diagnosis Date  . Hypersomnia, unspecified   . Other and unspecified hyperlipidemia   . Anxiety state, unspecified   . Abdominal pain, right lower quadrant   . Abdominal pain, epigastric   . Spasm of muscle   . Unspecified tinnitus   . Asymptomatic varicose veins   . Edema   . Rash and other nonspecific skin eruption   . Herpes zoster without mention of complication   . Blood vessel replaced by other means   . Other cataract   . Lumbago   . Insomnia, unspecified   . Pain in limb   . Optic neuritis   . Phlebitis and thrombophlebitis of other deep vessels of lower extremities   . Obesity, unspecified   . Unspecified essential hypertension   . Dizziness and giddiness   . Abnormality of gait   . Disorder of bone and cartilage, unspecified   . Abnormal weight gain   . Other abnormal blood chemistry   . Abdominal pain, epigastric   . Syncope and collapse   . Palpitations   . Encounter for long-term (current) use of other medications   . Myalgia and myositis, unspecified   . Cramp of limb   . Unspecified hypothyroidism   . Long term (current) use of anticoagulants   . Lumbago   . Muscle weakness (generalized)   . Phlebitis and thrombophlebitis of lower extremities, unspecified   .  Tobacco use disorder   . Pain in limb   . Unspecified sinusitis (chronic)   . Pain in joint, site unspecified   . Herpes zoster without mention of complication    Past Surgical History  Procedure Laterality Date  . Right knee  1971  . Right breast biopsy  1986  . [place] filter implant  2008    DR Bobette Mo  . Lower back  05/26/2007    DR JEFF BEANE  . Hip surgery left  03/2010    DR [facility]  . Hip surgery right  06/2010    DR [facility]  . Left shoulder  2012    DR [facility]    Family History  Problem Relation Age of Onset  . Stroke Mother   . Kidney disease Father   . Stroke Brother    History  Substance Use Topics  . Smoking status: Heavy Tobacco Smoker -- 1.00 packs/day    Types: Cigarettes  . Smokeless tobacco: Never Used     Comment: one pack Daily Maybe more.  . Alcohol Use: 0.6 oz/week    1 Shots of liquor per week   OB History   Grav Para Term Preterm Abortions TAB SAB Ect Mult Living                 Review of Systems Per HPI  with all other pertinent systems negative.   Allergies  Betadine; Calcium channel blockers; Codeine; Cortizone-10; Fluoride preparations; Iodine; Lanolin; Levaquin; Novocain; Other; Peanuts; Penicillins; Pollen extract; Prozac; Shellfish allergy; Synthroid; and Wellbutrin  Home Medications   Prior to Admission medications   Medication Sig Start Date End Date Taking? Authorizing Provider  acetaminophen (TYLENOL) 650 MG CR tablet Take 650 mg by mouth every 8 (eight) hours as needed for pain. Take one to two tablets every 8 hours as needed   Yes Historical Provider, MD  aspirin 325 MG tablet Take 325 mg by mouth daily.    Yes Historical Provider, MD  clindamycin (CLEOCIN) 300 MG capsule Take 1 capsule (300 mg total) by mouth 4 (four) times daily. 12/10/13  Yes Harden Mo, MD  Levothyroxine Sodium (TIROSINT) 88 MCG CAPS Take by mouth daily before breakfast.   Yes Historical Provider, MD  Multiple Vitamin  (MULTIVITAMIN WITH MINERALS) TABS Take 1 tablet by mouth daily.   Yes Historical Provider, MD  EPINEPHrine (EPIPEN) 0.3 mg/0.3 mL DEVI Inject 0.3 mLs (0.3 mg total) into the muscle as needed. 09/28/12   Virgel Manifold, MD  fluticasone (FLONASE) 50 MCG/ACT nasal spray Place 1-2 sprays into both nostrils daily. 12/15/13   Waldemar Dickens, MD  predniSONE (DELTASONE) 50 MG tablet Take daily with breakfast 12/15/13   Waldemar Dickens, MD  triamcinolone cream (KENALOG) 0.1 % Apply 1 application topically 2 (two) times daily.    Historical Provider, MD   There were no vitals taken for this visit. Physical Exam  Constitutional: She is oriented to person, place, and time. She appears well-developed and well-nourished. No distress.  HENT:  Head: Normocephalic.  Tonsils 2+ w/ mild exudate and erythema   Eyes: EOM are normal. Pupils are equal, round, and reactive to light.  Neck: Normal range of motion. Neck supple.  Cardiovascular: Normal rate, normal heart sounds and intact distal pulses.   No murmur heard. Pulmonary/Chest: Effort normal and breath sounds normal. No respiratory distress.  Abdominal: She exhibits no distension.  Musculoskeletal: Normal range of motion. She exhibits no edema and no tenderness.  Neurological: She is alert and oriented to person, place, and time. No cranial nerve deficit. She exhibits normal muscle tone. Coordination normal.  Skin: Skin is warm. No rash noted. She is not diaphoretic. No erythema. No pallor.  Psychiatric: She has a normal mood and affect. Her behavior is normal. Judgment and thought content normal.    ED Course  Procedures (including critical care time) Labs Review Labs Reviewed  POCT INFECTIOUS MONO SCREEN    Imaging Review No results found.   MDM   1. Strep throat   2. Tobacco abuse    Mono neg.   Likely continuation of Strep throat. Fatigue and tonsillar exudate and enlartement concerning for mono - but mono neg. Likely poor recovery  secondary to smoking 2ppd along w/ post nasal drip from allergies - start prednisone (h/o prednisone in past w/o difficulty) - cont clinda - flonase, zyrtec,  - cut back on smoking Precautions given and all questions answered  Linna Darner, MD Family Medicine 12/15/2013, 9:58 PM      Waldemar Dickens, MD 12/15/13 2158

## 2013-12-15 NOTE — ED Notes (Signed)
Reports recent dx of strep on 8/16.  Pt c/o rash on face and not feeling any better.

## 2013-12-19 LAB — EPSTEIN-BARR VIRUS VCA ANTIBODY PANEL
EBV EA IgG: 69 U/mL — ABNORMAL HIGH (ref <9.0)
EBV NA IgG: 53.5 U/mL — ABNORMAL HIGH (ref <18.0)
EBV VCA IgG: >750 U/mL — ABNORMAL HIGH (ref <18.0)
EBV VCA IgM: <10 U/mL (ref <36.0)

## 2013-12-20 LAB — MISCELLANEOUS TEST

## 2013-12-21 NOTE — ED Notes (Signed)
EBV VCA IgG >750.0 H, EBV VCA IgM <10.0,  EBV NA IgG 53.5 H, EBV EA IgG 69.0 H.  Message sent to Dr. Marily Memos to ask if I need to notify [name] 12/21/2013

## 2013-12-24 DIAGNOSIS — Z86718 Personal history of other venous thrombosis and embolism: Secondary | ICD-10-CM | POA: Insufficient documentation

## 2013-12-26 ENCOUNTER — Telehealth (HOSPITAL_COMMUNITY): Payer: Self-pay | Admitting: *Deleted

## 2013-12-26 NOTE — ED Notes (Addendum)
Dr. 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  Treat her symptoms and come back if not improving. I called pt. and left a message to call.  Call 1. Roselyn Meier 12/26/2013 Pt. called back.  Pt. verified x 2 and given results.  Pt. given the above information.  She f/u with and Urgent care in [place] on Sun. Because she was not feeling better.  They did a  Strep screen that was neg. and she said they sent it for culture.  She thought it was them calling her. I explained I was calling from [facility] with her results from 8/21. She said she does have a PCP she can go see.  Dr. Marily Memos notified.

## 2014-01-04 ENCOUNTER — Telehealth: Payer: Self-pay | Admitting: *Deleted

## 2014-01-04 NOTE — Telephone Encounter (Signed)
Patient walked in requesting her last bloodwork results. Printed and given to patient.

## 2014-01-04 NOTE — Telephone Encounter (Signed)
Patient came back in with questions regarding bloodwork she had done at Urgent Care. Asked Dr. Mariea Clonts what the Aris Lot test was testing for and she said Mono. Told patient that if her symptoms does not improve or worsen to follow up with our office. Patient agreed.

## 2014-01-08 ENCOUNTER — Telehealth: Payer: Self-pay | Admitting: *Deleted

## 2014-01-08 NOTE — Telephone Encounter (Signed)
Husband, [name], walked in and requested a copy of his wife's bloodwork. I called [employer] and received verbal ok to give to him. Printed and given.

## 2014-01-23 ENCOUNTER — Encounter: Payer: Self-pay | Admitting: Internal Medicine

## 2014-01-23 ENCOUNTER — Ambulatory Visit (INDEPENDENT_AMBULATORY_CARE_PROVIDER_SITE_OTHER): Payer: Medicare Other | Admitting: Internal Medicine

## 2014-01-23 VITALS — BP 138/84 | HR 85 | Temp 98.3°F | Wt 205.0 lb

## 2014-01-23 DIAGNOSIS — L609 Nail disorder, unspecified: Secondary | ICD-10-CM

## 2014-01-23 DIAGNOSIS — R0609 Other forms of dyspnea: Secondary | ICD-10-CM

## 2014-01-23 DIAGNOSIS — R5383 Other fatigue: Principal | ICD-10-CM

## 2014-01-23 DIAGNOSIS — E039 Hypothyroidism, unspecified: Secondary | ICD-10-CM

## 2014-01-23 DIAGNOSIS — R0600 Dyspnea, unspecified: Secondary | ICD-10-CM

## 2014-01-23 DIAGNOSIS — R5381 Other malaise: Secondary | ICD-10-CM

## 2014-01-23 DIAGNOSIS — R0989 Other specified symptoms and signs involving the circulatory and respiratory systems: Secondary | ICD-10-CM

## 2014-01-23 NOTE — Progress Notes (Signed)
Patient ID: [name], female   DOB: [birth year], 63 y.o.   MRN: [id number]    Chief Complaint  Patient presents with  . Acute Visit    ongoing sore throat, runny nose, recent strept throat  . Nail Problem    Nails are red x 1 year or longer    Allergies  Allergen Reactions  . Cortizone-10 [Hydrocortisone] Hives  . Synthroid [Levothyroxine Sodium] Anaphylaxis    Internal shaking   . Betadine [Povidone Iodine]     Aspiration   . Calcium Channel Blockers     Asthma  . Codeine     Flu-like symptoms   . Fluoride Preparations Other (See Comments)    Blisters  . Iodine Other (See Comments)    Aspiration   . Levaquin [Levofloxacin In D5w]   . Novocain [Procaine] Other (See Comments)  . Other     Beans,carbonation, dust and mold,floride,fried foods, household cleaner, melon,mushrooms,peppers,preservatives,rasisns,spicey,spinach,trees,milk products,shellfish  . Peanuts [Peanut Oil] Swelling    Face Swelling   . Penicillins   . Pollen Extract Other (See Comments)    Hemorrhage   . Prozac [Fluoxetine Hcl]   . Shellfish Allergy Other (See Comments)    Death  . Wellbutrin [Bupropion] Hives    After 64 pill fist size bumps appeared  . Lanolin Rash   HPI 63 y/o pt here for acute concerns Weak and tired since July Treated for strept throat x 1, second time strept throat test was negative and she was also positive for prior infection with infectious mononucleosis - tested in ED/ urgent care Continues to feel very weak ad tired Continues to smoke Has cough with clear phlegm Denies shortness of breath but feels she needs to work harder to breathe Clear runny nose Denies ear ache but has some throat discomfort on left side Patient tends to get bronchitis frequently  ROS No fever or chills appetite is fair No nausea, vomiting , bowel or bladder problem  Past Medical History  Diagnosis Date  . Hypersomnia, unspecified   . Other and unspecified hyperlipidemia   . Anxiety  state, unspecified   . Abdominal pain, right lower quadrant   . Abdominal pain, epigastric   . Spasm of muscle   . Unspecified tinnitus   . Asymptomatic varicose veins   . Edema   . Rash and other nonspecific skin eruption   . Herpes zoster without mention of complication   . Blood vessel replaced by other means   . Other cataract   . Lumbago   . Insomnia, unspecified   . Pain in limb   . Optic neuritis   . Phlebitis and thrombophlebitis of other deep vessels of lower extremities   . Obesity, unspecified   . Unspecified essential hypertension   . Dizziness and giddiness   . Abnormality of gait   . Disorder of bone and cartilage, unspecified   . Abnormal weight gain   . Other abnormal blood chemistry   . Abdominal pain, epigastric   . Syncope and collapse   . Palpitations   . Encounter for long-term (current) use of other medications   . Myalgia and myositis, unspecified   . Cramp of limb   . Unspecified hypothyroidism   . Long term (current) use of anticoagulants   . Lumbago   . Muscle weakness (generalized)   . Phlebitis and thrombophlebitis of lower extremities, unspecified   . Tobacco use disorder   . Pain in limb   . Unspecified sinusitis (chronic)   .  Pain in joint, site unspecified   . Herpes zoster without mention of complication    Current Outpatient Prescriptions on File Prior to Visit  Medication Sig Dispense Refill  . acetaminophen (TYLENOL) 650 MG CR tablet Take 650 mg by mouth every 8 (eight) hours as needed for pain (Takes 1-2 x weekly). Take one to two tablets every 8 hours as needed      . aspirin 325 MG tablet Take 325 mg by mouth daily.       Marland Kitchen EPINEPHrine (EPIPEN) 0.3 mg/0.3 mL DEVI Inject 0.3 mLs (0.3 mg total) into the muscle as needed.  2 Device  0  . fluticasone (FLONASE) 50 MCG/ACT nasal spray Place 1-2 sprays into both nostrils daily.  16 g  0  . Levothyroxine Sodium (TIROSINT) 88 MCG CAPS Take by mouth daily before breakfast.      .  triamcinolone cream (KENALOG) 0.1 % Apply 1 application topically 2 (two) times daily. ONLY USES 1-2 X WEEKLY       No current facility-administered medications on file prior to visit.   Physical exam BP 138/84  Pulse 85  Temp(Src) 98.3 F (36.8 C) (Oral)  Wt 205 lb (92.987 kg)  SpO2 96%  Constitutional: She is oriented to person, place, and time. overweight HENT:   Head: Normocephalic and atraumatic.  Right Ear: External ear normal.  Left Ear: External ear normal.   Nose: Nose normal.  Eyes: Conjunctivae and EOM are normal. Pupils are equal, round, and reactive to light.  Neck: Normal range of motion. Neck supple. No JVD present. No tracheal deviation present. No thyromegaly present.  Oropharynx: no oropharyngeal erythema, no exudates, no thrush Cardiovascular: Normal rate, regular rhythm, normal heart sounds and intact distal pulses.  Exam reveals no gallop and no friction rub.  No murmur heard. Pulmonary/Chest: Effort normal and breath sounds normal. No respiratory distress. She has no wheezes. She has no rales.  Abdominal: Soft. Bowel sounds are normal. She exhibits no distension and no mass. There is no tenderness.  Musculoskeletal: Normal range of motion.  Lymphadenopathy:    She has no cervical adenopathy.  Neurological: She is alert and oriented to person, place, and time. She has normal reflexes. No cranial nerve deficit. Coordination normal.  Skin: Skin is warm and dry. No rash  Nails: has orange tinge to her fingernails, some white spots on the nails, no signs of infection nail bed or nail cuticles, thin fine lines on nails, intact nails Psychiatric: She has a normal mood and affect. Her behavior is normal. Judgment and thought content normal.   Assessment/plan  1. Other malaise and fatigue Completed her antibiotic course, afebrile, normal oral exam. Rule out anemia and thyroid abnormality. Encouraged smoking cessation and exercise on routine basis. With hx of smoking  and ongoing runny nose, cough and throat discomfort, will get cxr to assess for any mass/ bronchitis changes - CBC with Differential - DG Chest 2 View; Future - CMP  2. Unspecified hypothyroidism Continue current dose of levothyroxine, check thyroid panel with her ongoing fatigue - TSH - T4, Free - T3 - CBC with Differential  3. Dyspnea Her smoking could be contributing to this with possible bronchitis/ emphysema chnages. Get cxr to evaluate further. Smoking cessation counselling provided - DG Chest 2 View; Future  4. Nail abnormality Has chronic nail changes for a year now. Has evidence of some white spots and concern for splinter hemorrhage. Check renal and liver function. Advised to avoid nail paint for some time  to help assess further

## 2014-01-24 ENCOUNTER — Other Ambulatory Visit: Payer: Self-pay | Admitting: *Deleted

## 2014-01-24 ENCOUNTER — Ambulatory Visit
Admission: RE | Admit: 2014-01-24 | Discharge: 2014-01-24 | Disposition: A | Discharge status: Home / Self Care | Payer: Medicare Other | Source: Ambulatory Visit | Attending: Internal Medicine | Admitting: Internal Medicine

## 2014-01-24 DIAGNOSIS — R5383 Other fatigue: Principal | ICD-10-CM

## 2014-01-24 DIAGNOSIS — R5381 Other malaise: Secondary | ICD-10-CM

## 2014-01-24 DIAGNOSIS — R0600 Dyspnea, unspecified: Secondary | ICD-10-CM

## 2014-01-24 LAB — COMPREHENSIVE METABOLIC PANEL
A/G RATIO: 2.4 (ref 1.1–2.5)
ALT: 10 IU/L (ref 0–32)
AST: 11 IU/L (ref 0–40)
Albumin: 4.5 g/dL (ref 3.6–4.8)
Alkaline Phosphatase: 83 IU/L (ref 39–117)
BUN/Creatinine Ratio: 14 (ref 11–26)
BUN: 15 mg/dL (ref 8–27)
CALCIUM: 9.1 mg/dL (ref 8.7–10.3)
CO2: 24 mmol/L (ref 18–29)
Chloride: 102 mmol/L (ref 97–108)
Creatinine, Ser: 1.04 mg/dL — ABNORMAL HIGH (ref 0.57–1.00)
GFR calc Af Amer: 66 mL/min/{1.73_m2} (ref >59)
GFR calc non Af Amer: 57 mL/min/{1.73_m2} — ABNORMAL LOW (ref >59)
GLUCOSE: 95 mg/dL (ref 65–99)
Globulin, Total: 1.9 g/dL (ref 1.5–4.5)
POTASSIUM: 4.3 mmol/L (ref 3.5–5.2)
Sodium: 142 mmol/L (ref 134–144)
Total Bilirubin: 0.2 mg/dL (ref 0.0–1.2)
Total Protein: 6.4 g/dL (ref 6.0–8.5)

## 2014-01-24 LAB — CBC WITH DIFFERENTIAL/PLATELET
Basophils Absolute: 0.1 10*3/uL (ref 0.0–0.2)
Basos: 1 %
EOS ABS: 0.2 10*3/uL (ref 0.0–0.4)
EOS: 2 %
HCT: 42.5 % (ref 34.0–46.6)
Hemoglobin: 14.3 g/dL (ref 11.1–15.9)
Immature Grans (Abs): 0 10*3/uL (ref 0.0–0.1)
Immature Granulocytes: 0 %
Lymphocytes Absolute: 2.4 10*3/uL (ref 0.7–3.1)
Lymphs: 26 %
MCH: 28.1 pg (ref 26.6–33.0)
MCHC: 33.6 g/dL (ref 31.5–35.7)
MCV: 84 fL (ref 79–97)
Monocytes Absolute: 1 10*3/uL — ABNORMAL HIGH (ref 0.1–0.9)
Monocytes: 10 %
NEUTROS PCT: 61 %
Neutrophils Absolute: 5.7 10*3/uL (ref 1.4–7.0)
RBC: 5.09 x10E6/uL (ref 3.77–5.28)
RDW: 13.8 % (ref 12.3–15.4)
WBC: 9.3 10*3/uL (ref 3.4–10.8)

## 2014-01-24 LAB — T4, FREE: Free T4: 1.6 ng/dL (ref 0.82–1.77)

## 2014-01-24 LAB — TSH: TSH: 1.19 u[IU]/mL (ref 0.450–4.500)

## 2014-01-24 LAB — T3: T3 TOTAL: 121 ng/dL (ref 71–180)

## 2014-01-24 MED ORDER — TETANUS-DIPHTH-ACELL PERTUSSIS 5-2.5-18.5 LF-MCG/0.5 IM SUSP: 0.5000 mL | Freq: Once | INTRAMUSCULAR | Status: DC

## 2014-01-24 NOTE — Telephone Encounter (Signed)
Patient requested and will pick up 

## 2014-01-25 ENCOUNTER — Telehealth: Payer: Self-pay | Admitting: *Deleted

## 2014-01-25 NOTE — Telephone Encounter (Signed)
Message copied by Eilene Ghazi on Thu Jan 25, 2014  4:03 PM ------      Message from: Blanchie Serve      Created: Wed Jan 24, 2014  5:08 PM       Normal thyroid function. No anemia on lab review. Your kidney function appears to have worsened. i encourage hydration for now, do not take aleve/ advil/ ibuprofen or any other OTC pain med until consulting your doctor. i want you to see dr green for your upcoming appointment for routine visit ------

## 2014-01-25 NOTE — Telephone Encounter (Signed)
Spoke with patient regarding lab results, she stated that she would try to hydrate more than she does now. Informed her to not take any OTC pain medication without consulting with her physician.

## 2014-03-06 ENCOUNTER — Ambulatory Visit (INDEPENDENT_AMBULATORY_CARE_PROVIDER_SITE_OTHER): Payer: Medicare Other | Admitting: Internal Medicine

## 2014-03-06 ENCOUNTER — Encounter: Payer: Self-pay | Admitting: Internal Medicine

## 2014-03-06 VITALS — BP 118/80 | HR 71 | Temp 98.2°F | Resp 10 | Wt 200.0 lb

## 2014-03-06 DIAGNOSIS — R059 Cough, unspecified: Secondary | ICD-10-CM

## 2014-03-06 DIAGNOSIS — R05 Cough: Secondary | ICD-10-CM

## 2014-03-06 DIAGNOSIS — E039 Hypothyroidism, unspecified: Secondary | ICD-10-CM

## 2014-03-06 DIAGNOSIS — I10 Essential (primary) hypertension: Secondary | ICD-10-CM

## 2014-03-06 DIAGNOSIS — J3089 Other allergic rhinitis: Secondary | ICD-10-CM

## 2014-03-06 DIAGNOSIS — J309 Allergic rhinitis, unspecified: Secondary | ICD-10-CM

## 2014-03-06 DIAGNOSIS — J302 Other seasonal allergic rhinitis: Secondary | ICD-10-CM | POA: Insufficient documentation

## 2014-03-06 NOTE — Progress Notes (Signed)
Patient ID: [name], female   DOB: 1950/12/23, 63 y.o.   MRN: [id number]    Facility  [facility]    Place of Service:   OFFICE   Allergies  Allergen Reactions  . Cortizone-10 [Hydrocortisone] Hives  . Synthroid [Levothyroxine Sodium] Anaphylaxis    Internal shaking   . Betadine [Povidone Iodine]     Aspiration   . Calcium Channel Blockers     Asthma  . Codeine     Flu-like symptoms   . Fluoride Preparations Other (See Comments)    Blisters  . Iodine Other (See Comments)    Aspiration   . Levaquin [Levofloxacin In D5w]   . Novocain [Procaine] Other (See Comments)  . Other     Beans,carbonation, dust and mold,floride,fried foods, household cleaner, melon,mushrooms,peppers,preservatives,rasisns,spicey,spinach,trees,milk products,shellfish  . Peanuts [Peanut Oil] Swelling    Face Swelling   . Penicillins   . Pollen Extract Other (See Comments)    Hemorrhage   . Prozac [Fluoxetine Hcl]   . Shellfish Allergy Other (See Comments)    Death  . Wellbutrin [Bupropion] Hives    After 1 pill fist size bumps appeared  . Lanolin Rash    Chief Complaint  Patient presents with  . Medical Management of Chronic Issues    6 week follow-up on URI (unresolved) -? referral to allergist    HPI:  Allergic rhinitis, unspecified allergic rhinitis type - patient is requesting referral to Allergy. She has a history of getting allergy injections from the 7th grade through college and she thinks she may need to start them again. She complains of upper airway congestion and swollen eyes with itching.  Cough: persistent and dry  Essential hypertension: controlled    Medications: Patient's Medications  New Prescriptions   No medications on file  Previous Medications   ACETAMINOPHEN (TYLENOL) 650 MG CR TABLET    Take 650 mg by mouth every 8 (eight) hours as needed for pain (Takes 1-2 x weekly). Take one to two tablets every 8 hours as needed   ASPIRIN 325 MG TABLET    Take 325 mg by mouth  daily.    EPINEPHRINE (EPIPEN) 0.3 MG/0.3 ML DEVI    Inject 0.3 mLs (0.3 mg total) into the muscle as needed.   FLUTICASONE (FLONASE) 50 MCG/ACT NASAL SPRAY    Place 1-2 sprays into both nostrils daily.   LEVOTHYROXINE SODIUM (TIROSINT) 88 MCG CAPS    Take by mouth daily before breakfast.   TRIAMCINOLONE CREAM (KENALOG) 0.1 %    Apply 1 application topically 2 (two) times daily. ONLY USES 1-2 X WEEKLY   VITAMIN B-12 (CYANOCOBALAMIN) 1000 MCG TABLET    Take 1,000 mcg by mouth daily.  Modified Medications   No medications on file  Discontinued Medications   TDAP (BOOSTRIX) 5-2.5-18.5 LF-MCG/0.5 INJECTION    Inject 0.5 mLs into the muscle once.     Review of Systems  Constitutional: Positive for activity change. Negative for chills, diaphoresis, appetite change and fatigue.       Recent diagnosis of obstructive sleep apnea. She will need CPAP.  Eyes: Negative.   Respiratory: Negative.   Cardiovascular: Negative.   Gastrointestinal: Negative.   Endocrine: Negative.   Genitourinary: Negative.   Musculoskeletal:       Patient continues to have chronic back discomfort. He has had chronic issues with her knees for several years. He had a recent fall significantly hurt her left knee.  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     Filed Vitals:   03/06/14 1520  BP: 118/80  Pulse: 71  Temp: 98.2 F (36.8 C)  TempSrc: Oral  Resp: 10  Weight: 200 lb (90.719 kg)  SpO2: 93%   Body mass index is 31.32 kg/(m^2).  Physical Exam  Constitutional: She is oriented to person, place, and time.  Overweight middle-aged female.  HENT:  Head: Normocephalic and atraumatic.  Right Ear: External ear normal.  Left Ear: External ear normal.  Nose: Nose normal.  Eyes: Conjunctivae and EOM are normal. Pupils are equal, round, and reactive to light.  Neck: Normal  range of motion. Neck supple. No JVD present. No tracheal deviation present. No thyromegaly present.  Cardiovascular: Normal rate, regular rhythm, normal heart sounds and intact distal pulses.  Exam reveals no gallop and no friction rub.   No murmur heard. Pulmonary/Chest: Effort normal and breath sounds normal. No respiratory distress. She has no wheezes. She has no rales.  Abdominal: Soft. Bowel sounds are normal. She exhibits no distension and no mass. There is no tenderness.  Musculoskeletal: Normal range of motion. She exhibits edema and tenderness.  Lymphadenopathy:    She has no cervical adenopathy.  Neurological: She is alert and oriented to person, place, and time. She has normal reflexes. No cranial nerve deficit. Coordination normal.  Skin: Skin is warm and dry. No rash (uunable to see any rash today) noted. No erythema. No pallor.  Psychiatric: She has a normal mood and affect. Her behavior is normal. Judgment and thought content normal.     Labs reviewed: Office Visit on 01/23/2014  Component Date Value Ref Range Status  . TSH 01/23/2014 1.190  0.450 - 4.500 uIU/mL Final  . Free T4 01/23/2014 1.60  0.82 - 1.77 ng/dL Final  . T3, Total 01/23/2014 121  71 - 180 ng/dL Final  . WBC 01/23/2014 9.3  3.4 - 10.8 x10E3/uL Final  . RBC 01/23/2014 5.09  3.77 - 5.28 x10E6/uL Final  . Hemoglobin 01/23/2014 14.3  11.1 - 15.9 g/dL Final  . HCT 01/23/2014 42.5  34.0 - 46.6 % Final  . MCV 01/23/2014 84  79 - 97 fL Final  . MCH 01/23/2014 28.1  26.6 - 33.0 pg Final  . MCHC 01/23/2014 33.6  31.5 - 35.7 g/dL Final  . RDW 01/23/2014 13.8  12.3 - 15.4 % Final  . Neutrophils Relative % 01/23/2014 61   Final  . Lymphs 01/23/2014 26   Final  . Monocytes 01/23/2014 10   Final  . Eos 01/23/2014 2   Final  . Basos 01/23/2014 1   Final  . Neutrophils Absolute 01/23/2014 5.7  1.4 - 7.0 x10E3/uL Final  . Lymphocytes Absolute 01/23/2014 2.4  0.7 - 3.1 x10E3/uL Final  . Monocytes Absolute 01/23/2014  1.0* 0.1 - 0.9 x10E3/uL Final  . Eosinophils Absolute 01/23/2014 0.2  0.0 - 0.4 x10E3/uL Final  . Basophils Absolute 01/23/2014 0.1  0.0 - 0.2 x10E3/uL Final  . Immature Granulocytes 01/23/2014 0   Final  . Immature Grans (Abs) 01/23/2014 0.0  0.0 - 0.1 x10E3/uL Final  . Glucose 01/23/2014 95  65 - 99 mg/dL Final  . BUN 01/23/2014 15  8 - 27 mg/dL Final  . Creatinine, Ser 01/23/2014 1.04* 0.57 - 1.00 mg/dL Final  . GFR calc non Af Amer 01/23/2014 57* >59 mL/min/1.73 Final  . GFR calc Af  Amer 01/23/2014 66  >59 mL/min/1.73 Final  . BUN/Creatinine Ratio 01/23/2014 14  11 - 26 Final  . Sodium 01/23/2014 142  134 - 144 mmol/L Final  . Potassium 01/23/2014 4.3  3.5 - 5.2 mmol/L Final  . Chloride 01/23/2014 102  97 - 108 mmol/L Final  . CO2 01/23/2014 24  18 - 29 mmol/L Final  . Calcium 01/23/2014 9.1  8.7 - 10.3 mg/dL Final  . Total Protein 01/23/2014 6.4  6.0 - 8.5 g/dL Final  . Albumin 01/23/2014 4.5  3.6 - 4.8 g/dL Final  . Globulin, Total 01/23/2014 1.9  1.5 - 4.5 g/dL Final  . Albumin/Globulin Ratio 01/23/2014 2.4  1.1 - 2.5 Final  . Total Bilirubin 01/23/2014 0.2  0.0 - 1.2 mg/dL Final  . Alkaline Phosphatase 01/23/2014 83  39 - 117 IU/L Final  . AST 01/23/2014 11  0 - 40 IU/L Final  . ALT 01/23/2014 10  0 - 32 IU/L Final  Admission on 12/15/2013, Discharged on 12/15/2013  Component Date Value Ref Range Status  . Mono Screen 12/15/2013 NEGATIVE  NEGATIVE Final  . Miscellaneous Test 12/15/2013 MONONUCLEOSIS IGG IGM AB SST SENT   Corrected   CORRECTED ON 08/21 AT 2257: PREVIOUSLY REPORTED AS MONO IGG IGM AB SST SENT  . EBV VCA IgG 12/15/2013 >750.0* <18.0 U/mL Final   Comment: (NOTE)                          Reference Range:       <18.0 U/mL = Negative                                            18.0-21.9 U/mL = Equivocal                                               >=22.0 U/mL = Positive  . EBV VCA IgM 12/15/2013 <10.0  <36.0 U/mL Final   Comment: (NOTE)                           Reference Range:       <36.0 U/mL = Negative                                            36.0-43.9 U/mL = Equivocal                                               >=44.0 U/mL = Positive  . EBV NA IgG 12/15/2013 53.5* <18.0 U/mL Final   Comment: (NOTE)                          Reference Range:       <18.0 U/mL = Negative  18.0-21.9 U/mL = Equivocal                                               >=22.0 U/mL = Positive                                Clinical Stage            VCA IgG   VCA IgM      EA    EBV NA                                Susceptibility               -         -         -       -                                Very Early Infection        +/-       +/-        -       -                                Established Infection        +         +        +/-      -                                Recent Infection             +         +        +/-     +/-                                Past Infection               +         -        +/-      +                                                                                                                               +/- means positive or negative (not weak)  High persisting antibody levels may be present in Burkitt's lymphoma                          and nasopharyngeal carcinoma.  . EBV EA IgG 12/15/2013 69.0* <9.0 U/mL Final   Comment: (NOTE)                          Reference Range:        <9.0 U/mL = Negative                                             9.0-10.9 U/mL = Equivocal                                               >=11.0 U/mL = Positive                          Assay cross-reactivity for Early Antigen (EA) has been noted with some                          specimens containing antibody to Human Immunodeficiency Virus (HIV).                          HIV disease must be excluded before confirmation of EBV diagnosis.                            Performed at [facility]     Assessment/Plan  1. Allergic rhinitis, unspecified allergic rhinitis type - Ambulatory referral to Allergy  2. Cough Use OTC cough suppressant as needed  3. Essential hypertension controlled  4. Hypothyroidism, unspecified hypothyroidism type compensated

## 2014-03-17 DIAGNOSIS — H469 Unspecified optic neuritis: Secondary | ICD-10-CM | POA: Insufficient documentation

## 2014-03-17 DIAGNOSIS — Z8679 Personal history of other diseases of the circulatory system: Secondary | ICD-10-CM | POA: Insufficient documentation

## 2014-04-17 ENCOUNTER — Encounter: Payer: Self-pay | Admitting: Internal Medicine

## 2014-05-04 ENCOUNTER — Institutional Professional Consult (permissible substitution): Payer: Medicare Other | Admitting: Internal Medicine

## 2014-05-17 ENCOUNTER — Encounter: Payer: Self-pay | Admitting: Nurse Practitioner

## 2014-05-17 ENCOUNTER — Ambulatory Visit (INDEPENDENT_AMBULATORY_CARE_PROVIDER_SITE_OTHER): Payer: Medicare Other | Admitting: Nurse Practitioner

## 2014-05-17 VITALS — BP 128/80 | HR 75 | Temp 98.0°F | Resp 10 | Ht 67.0 in | Wt 195.0 lb

## 2014-05-17 DIAGNOSIS — J069 Acute upper respiratory infection, unspecified: Secondary | ICD-10-CM

## 2014-05-17 MED ORDER — DOXYCYCLINE HYCLATE 100 MG PO TABS: 100.0000 mg | ORAL_TABLET | Freq: Two times a day (BID) | ORAL | Status: DC

## 2014-05-17 NOTE — Patient Instructions (Signed)
Doxycyline 100 mg by mouth twice daily for 10 days Increase fluid intake  mucinex DM by mouth twice daily with full glass of water Upper Respiratory Infection, Adult An upper respiratory infection (URI) is also sometimes known as the common cold. The upper respiratory tract includes the nose, sinuses, throat, trachea, and bronchi. Bronchi are the airways leading to the lungs. Most people improve within 1 week, but symptoms can last up to 2 weeks. A residual cough may last even longer.  CAUSES Many different viruses can infect the tissues lining the upper respiratory tract. The tissues become irritated and inflamed and often become very moist. Mucus production is also common. A cold is contagious. You can easily spread the virus to others by oral contact. This includes kissing, sharing a glass, coughing, or sneezing. Touching your mouth or nose and then touching a surface, which is then touched by another person, can also spread the virus. SYMPTOMS  Symptoms typically develop 1 to 3 days after you come in contact with a cold virus. Symptoms vary from person to person. They may include:  Runny nose.  Sneezing.  Nasal congestion.  Sinus irritation.  Sore throat.  Loss of voice (laryngitis).  Cough.  Fatigue.  Muscle aches.  Loss of appetite.  Headache.  Low-grade fever. DIAGNOSIS  You might diagnose your own cold based on familiar symptoms, since most people get a cold 2 to 3 times a year. Your caregiver can confirm this based on your exam. Most importantly, your caregiver can check that your symptoms are not due to another disease such as strep throat, sinusitis, pneumonia, asthma, or epiglottitis. Blood tests, throat tests, and X-rays are not necessary to diagnose a common cold, but they may sometimes be helpful in excluding other more serious diseases. Your caregiver will decide if any further tests are required. RISKS AND COMPLICATIONS  You may be at risk for a more severe case  of the common cold if you smoke cigarettes, have chronic heart disease (such as heart failure) or lung disease (such as asthma), or if you have a weakened immune system. The very young and very old are also at risk for more serious infections. Bacterial sinusitis, middle ear infections, and bacterial pneumonia can complicate the common cold. The common cold can worsen asthma and chronic obstructive pulmonary disease (COPD). Sometimes, these complications can require emergency medical care and may be life-threatening. PREVENTION  The best way to protect against getting a cold is to practice good hygiene. Avoid oral or hand contact with people with cold symptoms. Wash your hands often if contact occurs. There is no clear evidence that vitamin C, vitamin E, echinacea, or exercise reduces the chance of developing a cold. However, it is always recommended to get plenty of rest and practice good nutrition. TREATMENT  Treatment is directed at relieving symptoms. There is no cure. Antibiotics are not effective, because the infection is caused by a virus, not by bacteria. Treatment may include:  Increased fluid intake. Sports drinks offer valuable electrolytes, sugars, and fluids.  Breathing heated mist or steam (vaporizer or shower).  Eating chicken soup or other clear broths, and maintaining good nutrition.  Getting plenty of rest.  Using gargles or lozenges for comfort.  Controlling fevers with ibuprofen or acetaminophen as directed by your caregiver.  Increasing usage of your inhaler if you have asthma. Zinc gel and zinc lozenges, taken in the first 24 hours of the common cold, can shorten the duration and lessen the severity of symptoms. Pain  medicines may help with fever, muscle aches, and throat pain. A variety of non-prescription medicines are available to treat congestion and runny nose. Your caregiver can make recommendations and may suggest nasal or lung inhalers for other symptoms.  HOME CARE  INSTRUCTIONS   Only take over-the-counter or prescription medicines for pain, discomfort, or fever as directed by your caregiver.  Use a warm mist humidifier or inhale steam from a shower to increase air moisture. This may keep secretions moist and make it easier to breathe.  Drink enough water and fluids to keep your urine clear or pale yellow.  Rest as needed.  Return to work when your temperature has returned to normal or as your caregiver advises. You may need to stay home longer to avoid infecting others. You can also use a face mask and careful hand washing to prevent spread of the virus. SEEK MEDICAL CARE IF:   After the first few days, you feel you are getting worse rather than better.  You need your caregiver's advice about medicines to control symptoms.  You develop chills, worsening shortness of breath, or brown or red sputum. These may be signs of pneumonia.  You develop yellow or brown nasal discharge or pain in the face, especially when you bend forward. These may be signs of sinusitis.  You develop a fever, swollen neck glands, pain with swallowing, or white areas in the back of your throat. These may be signs of strep throat. SEEK IMMEDIATE MEDICAL CARE IF:   You have a fever.  You develop severe or persistent headache, ear pain, sinus pain, or chest pain.  You develop wheezing, a prolonged cough, cough up blood, or have a change in your usual mucus (if you have chronic lung disease).  You develop sore muscles or a stiff neck. Document Released: 10/07/2000 Document Revised: 07/06/2011 Document Reviewed: 07/19/2013 [facility] Patient Information 2015 [place], [place]. This information is not intended to replace advice given to you by your health care provider. Make sure you discuss any questions you have with your health care provider.

## 2014-05-17 NOTE — Progress Notes (Signed)
Patient ID: [name], female   DOB: 1950-07-06, 64 y.o.   MRN: [id number]    PCP: Estill Dooms, MD  Allergies  Allergen Reactions  . Cortizone-10 [Hydrocortisone] Hives  . Synthroid [Levothyroxine Sodium] Anaphylaxis    Internal shaking   . Betadine [Povidone Iodine]     Aspiration   . Calcium Channel Blockers     Asthma  . Codeine     Flu-like symptoms   . Fluoride Preparations Other (See Comments)    Blisters  . Iodine Other (See Comments)    Aspiration   . Levaquin [Levofloxacin In D5w]   . Novocain [Procaine] Other (See Comments)  . Other     Beans,carbonation, dust and mold,floride,fried foods, household cleaner, melon,mushrooms,peppers,preservatives,rasisns,spicey,spinach,trees,milk products,shellfish  . Peanuts [Peanut Oil] Swelling    Face Swelling   . Penicillins   . Pollen Extract Other (See Comments)    Hemorrhage   . Prozac [Fluoxetine Hcl]   . Shellfish Allergy Other (See Comments)    Death  . Wellbutrin [Bupropion] Hives    After 1 pill fist size bumps appeared  . Lanolin Rash    Chief Complaint  Patient presents with  . URI    Cough (productive-clear), head/chest congestion, possible fever, pressure behind ears and head. Patient also c/o of ears ringing. Symptoms x 1.5 week(s)     HPI: Patient is a 64 y.o. female seen in the office today due to cough and congestion for 1.5 weeks. Thought it was getting better but now she is dry coughing. Now she is coughing more with chest and nasal congestion. Honey has been helping better than cough syrup. No shortness of breath. Had fever not currently. Was feeling better yesterday but now worse without energy.  Using robitussin which was working but not any more Coughing a lot at night  Current smoker  Review of Systems:  Review of Systems  Constitutional: Negative for activity change, appetite change, fatigue and unexpected weight change.  HENT: Positive for congestion, rhinorrhea and sinus pressure.  Negative for facial swelling, sneezing and sore throat.   Respiratory: Positive for cough and wheezing (at times). Negative for shortness of breath.   Cardiovascular: Negative for chest pain, palpitations and leg swelling.  Neurological: Negative for dizziness.    Past Medical History  Diagnosis Date  . Hypersomnia, unspecified   . Other and unspecified hyperlipidemia   . Anxiety state, unspecified   . Abdominal pain, right lower quadrant   . Abdominal pain, epigastric   . Spasm of muscle   . Unspecified tinnitus   . Asymptomatic varicose veins   . Edema   . Rash and other nonspecific skin eruption   . Herpes zoster without mention of complication   . Blood vessel replaced by other means   . Other cataract   . Lumbago   . Insomnia, unspecified   . Pain in limb   . Optic neuritis   . Phlebitis and thrombophlebitis of other deep vessels of lower extremities   . Obesity, unspecified   . Unspecified essential hypertension   . Dizziness and giddiness   . Abnormality of gait   . Disorder of bone and cartilage, unspecified   . Abnormal weight gain   . Other abnormal blood chemistry   . Abdominal pain, epigastric   . Syncope and collapse   . Palpitations   . Encounter for long-term (current) use of other medications   . Myalgia and myositis, unspecified   . Cramp of limb   .  Unspecified hypothyroidism   . Long term (current) use of anticoagulants   . Lumbago   . Muscle weakness (generalized)   . Phlebitis and thrombophlebitis of lower extremities, unspecified   . Tobacco use disorder   . Pain in limb   . Unspecified sinusitis (chronic)   . Pain in joint, site unspecified   . Herpes zoster without mention of complication    Past Surgical History  Procedure Laterality Date  . Right knee  1971  . Right breast biopsy  1986  . [place] filter implant  2008    DR Bobette Mo  . Lower back  05/26/2007    DR JEFF BEANE  . Hip surgery left  03/2010    DR [facility]  .  Hip surgery right  06/2010    DR [facility]  . Left shoulder  2012    DR [facility]    Social History:   reports that she has been smoking Cigarettes.  She has been smoking about 1.00 pack per day. She has never used smokeless tobacco. She reports that she drinks about 0.6 oz of alcohol per week. She reports that she does not use illicit drugs.  Family History  Problem Relation Age of Onset  . Stroke Mother   . Kidney disease Father   . Stroke Brother     Medications: Patient's Medications  New Prescriptions   No medications on file  Previous Medications   ACETAMINOPHEN (TYLENOL) 650 MG CR TABLET    Take 650 mg by mouth every 8 (eight) hours as needed for pain (Takes 1-2 x weekly). Take one to two tablets every 8 hours as needed   ASPIRIN 325 MG TABLET    Take 325 mg by mouth daily.    EPINEPHRINE (EPIPEN) 0.3 MG/0.3 ML DEVI    Inject 0.3 mLs (0.3 mg total) into the muscle as needed.   FLUTICASONE (FLONASE) 50 MCG/ACT NASAL SPRAY    Place 1-2 sprays into both nostrils daily.   LEVOTHYROXINE SODIUM (TIROSINT) 88 MCG CAPS    Take by mouth daily before breakfast.   POLYETHYL GLYCOL-PROPYL GLYCOL (SYSTANE PRESERVATIVE FREE OP)    Apply to eye.   TRIAMCINOLONE CREAM (KENALOG) 0.1 %    Apply 1 application topically 2 (two) times daily. ONLY USES 1-2 X WEEKLY   VITAMIN B-12 (CYANOCOBALAMIN) 1000 MCG TABLET    Take 1,000 mcg by mouth daily.  Modified Medications   No medications on file  Discontinued Medications   TETRAHYDROZOLINE HCL (EYE DROPS OP)    Apply to eye. Twice daily     Physical Exam:  Filed Vitals:   05/17/14 1521  BP: 128/80  Pulse: 75  Temp: 98 F (36.7 C)  TempSrc: Oral  Resp: 10  Height: 5\' 7"  (1.702 m)  Weight: 195 lb (88.451 kg)  SpO2: 96%    Physical Exam  Constitutional: She appears well-developed and well-nourished. No distress.  HENT:  Head: Normocephalic and atraumatic.  Right Ear: External ear normal.  Left Ear: External ear normal.    Nose: Nose normal.  Mouth/Throat: Oropharynx is clear and moist. No oropharyngeal exudate.  Eyes: Conjunctivae and EOM are normal. Pupils are equal, round, and reactive to light.  Neck: Normal range of motion. Neck supple.  Cardiovascular: Normal rate, regular rhythm and normal heart sounds.   Pulmonary/Chest: Effort normal and breath sounds normal. No respiratory distress.  Abdominal: Soft. Bowel sounds are normal. She exhibits no distension.  Neurological: She is alert.  Skin: Skin is warm  and dry. She is not diaphoretic.    Labs reviewed: Basic Metabolic Panel:  Recent Labs  08/23/13 1328 01/23/14 1657  NA 144 142  K 4.5 4.3  CL 104 102  CO2 23 24  GLUCOSE 91 95  BUN 10 15  CREATININE 0.70 1.04*  CALCIUM 9.2 9.1  TSH  --  1.190   Liver Function Tests:  Recent Labs  08/23/13 1328 01/23/14 1657  AST 12 11  ALT 7 10  ALKPHOS 86 83  BILITOT 0.4 0.2  PROT 6.2 6.4   No results for input(s): LIPASE, AMYLASE in the last 8760 hours. No results for input(s): AMMONIA in the last 8760 hours. CBC:  Recent Labs  08/23/13 1328 01/23/14 1657  WBC 7.3 9.3  NEUTROABS 3.9 5.7  HGB 14.1 14.3  HCT 41.0 42.5  MCV 80 84  PLT 249  --    Lipid Panel: No results for input(s): CHOL, HDL, LDLCALC, TRIG, CHOLHDL, LDLDIRECT in the last 8760 hours. TSH:  Recent Labs  01/23/14 1657  TSH 1.190   A1C: No results found for: HGBA1C   Assessment/Plan  1. Upper respiratory infection with cough and congestion -may use mucinex DM 1 tablet twice daily with full glass of water as needed for cough -increase hydration -proper PO intake - doxycycline (VIBRA-TABS) 100 MG tablet; Take 1 tablet (100 mg total) by mouth 2 (two) times daily.  Dispense: 20 tablet; Refill: 0 -educated regarding need for medical treatment if symptoms worsening or fail to improve

## 2014-06-04 ENCOUNTER — Other Ambulatory Visit: Payer: Medicare Other

## 2014-06-04 ENCOUNTER — Encounter: Payer: Self-pay | Admitting: Internal Medicine

## 2014-06-04 ENCOUNTER — Ambulatory Visit (INDEPENDENT_AMBULATORY_CARE_PROVIDER_SITE_OTHER): Payer: Medicare Other | Admitting: Internal Medicine

## 2014-06-04 VITALS — BP 110/72 | HR 85 | Ht 67.0 in | Wt 197.0 lb

## 2014-06-04 DIAGNOSIS — Z720 Tobacco use: Secondary | ICD-10-CM | POA: Insufficient documentation

## 2014-06-04 DIAGNOSIS — J45909 Unspecified asthma, uncomplicated: Secondary | ICD-10-CM | POA: Insufficient documentation

## 2014-06-04 DIAGNOSIS — J4520 Mild intermittent asthma, uncomplicated: Secondary | ICD-10-CM

## 2014-06-04 DIAGNOSIS — J302 Other seasonal allergic rhinitis: Secondary | ICD-10-CM

## 2014-06-04 DIAGNOSIS — J309 Allergic rhinitis, unspecified: Secondary | ICD-10-CM

## 2014-06-04 DIAGNOSIS — Z86718 Personal history of other venous thrombosis and embolism: Secondary | ICD-10-CM

## 2014-06-04 DIAGNOSIS — J3089 Other allergic rhinitis: Principal | ICD-10-CM

## 2014-06-04 NOTE — Assessment & Plan Note (Signed)
Counseling smoking cessation

## 2014-06-04 NOTE — Assessment & Plan Note (Signed)
Perennial allergic rhinitis with some seasonal exacerbation Plan-allergy profile, food IgE

## 2014-06-04 NOTE — Patient Instructions (Signed)
Order- Allergy profile, Food IgE, alpha- gal IgE   Dx allergic rhinitis  Please call as needed

## 2014-06-04 NOTE — Progress Notes (Signed)
06/04/14- 63 yoFsmoker referred courtesy of Dr. Elder Negus for allergy evaluation: pt has has frequent URI. She c/o clear thick mucus with cough , not much wheezing, and sob.  Growing up in [place] she had allergic rhinitis and facial swelling. Allergy skin testing followed by 7 years of allergy shots seemed to clear her completely. She remained well until she moved to [place] several years ago. She complains of allergy-facial swelling, eyes irritated nasal discharge. These gradually gotten worse and more perennial. Symptoms are noticed especially on waking and after eating. Triggers include shellfish, melons (smell makes her throat tight), must and seasonal pollens. Treatment of rhinitis-no ENT surgery. Little success with nasal sprays or antihistamines. History of asthma treated with inhalers. Pneumonia in the past, but not since she began getting flu shots. Sinusitis only once. Environment: House, 2 dogs indoors, she and husband smoke, dust from current house remodeling, no mold. CXR  06/04/14 -IMPRESSION: No active cardiopulmonary disease. Electronically Signed  By: Hassan Rowan M.D.  On: 01/24/2014 15:  Prior to Admission medications   Medication Sig Start Date End Date Taking? Authorizing Provider  acetaminophen (TYLENOL) 650 MG CR tablet Take 650 mg by mouth every 8 (eight) hours as needed for pain (Takes 1-2 x weekly). Take one to two tablets every 8 hours as needed   Yes Historical Provider, MD  aspirin 325 MG tablet Take 325 mg by mouth daily.    Yes Historical Provider, MD  EPINEPHrine (EPIPEN) 0.3 mg/0.3 mL DEVI Inject 0.3 mLs (0.3 mg total) into the muscle as needed. 09/28/12  Yes Virgel Manifold, MD  fluticasone (FLONASE) 50 MCG/ACT nasal spray Place 1-2 sprays into both nostrils daily. 12/15/13  Yes Waldemar Dickens, MD  Levothyroxine Sodium (TIROSINT) 88 MCG CAPS Take by mouth daily before breakfast.   Yes Historical Provider, MD  loratadine (CLARITIN) 10 MG tablet Take 10 mg by mouth  daily.   Yes Historical Provider, MD  Polyethyl Glycol-Propyl Glycol (SYSTANE PRESERVATIVE FREE OP) Apply to eye.   Yes Historical Provider, MD  triamcinolone cream (KENALOG) 0.1 % Apply 1 application topically daily as needed. ONLY USES 1-2 X WEEKLY   Yes Historical Provider, MD  vitamin B-12 (CYANOCOBALAMIN) 1000 MCG tablet Take 1,000 mcg by mouth daily.   Yes Historical Provider, MD   Past Medical History  Diagnosis Date  . Hypersomnia, unspecified   . Other and unspecified hyperlipidemia   . Anxiety state, unspecified   . Abdominal pain, right lower quadrant   . Abdominal pain, epigastric   . Spasm of muscle   . Unspecified tinnitus   . Asymptomatic varicose veins   . Edema   . Rash and other nonspecific skin eruption   . Herpes zoster without mention of complication   . Blood vessel replaced by other means   . Other cataract   . Lumbago   . Insomnia, unspecified   . Pain in limb   . Optic neuritis   . Phlebitis and thrombophlebitis of other deep vessels of lower extremities   . Obesity, unspecified   . Unspecified essential hypertension   . Dizziness and giddiness   . Abnormality of gait   . Disorder of bone and cartilage, unspecified   . Abnormal weight gain   . Other abnormal blood chemistry   . Abdominal pain, epigastric   . Syncope and collapse   . Palpitations   . Encounter for long-term (current) use of other medications   . Myalgia and myositis, unspecified   . Cramp  of limb   . Unspecified hypothyroidism   . Long term (current) use of anticoagulants   . Lumbago   . Muscle weakness (generalized)   . Phlebitis and thrombophlebitis of lower extremities, unspecified   . Tobacco use disorder   . Pain in limb   . Unspecified sinusitis (chronic)   . Pain in joint, site unspecified   . Herpes zoster without mention of complication    Past Surgical History  Procedure Laterality Date  . Right knee  1971  . Right breast biopsy  1986  . [place] filter implant   2008    DR Bobette Mo  . Lower back  05/26/2007    DR JEFF BEANE  . Hip surgery left  03/2010    DR [facility]  . Hip surgery right  06/2010    DR [facility]  . Left shoulder  2012    DR CHRIS BLACKMAN    ROS-see HPI Constitutional:   No-   weight loss, night sweats, fevers, chills, fatigue, lassitude. HEENT:   No-  headaches, difficulty swallowing, tooth/dental problems, sore throat,       + sneezing, itching, ear ache,+ nasal congestion, post nasal drip,  CV:  No-   chest pain, orthopnea, PND, swelling in lower extremities, anasarca,                                  dizziness, palpitations Resp: No-   shortness of breath with exertion or at rest.              No-   productive cough,  No non-productive cough,  No- coughing up of blood.              No-   change in color of mucus.  + wheezing.   Skin: No-   rash or lesions. GI:  No-   heartburn, indigestion, abdominal pain, nausea, vomiting, diarrhea,                 change in bowel habits, loss of appetite GU: No-   dysuria, change in color of urine, no urgency or frequency.  No- flank pain. MS:  No-   joint pain or swelling.  No- decreased range of motion.  No- back pain. Neuro-     nothing unusual Psych:  No- change in mood or affect. No depression or anxiety.  No memory loss.  OBJ- Physical Exam    has minor incidental cold at this visit, self-limiting General- Alert, Oriented, Affect-appropriate, Distress- none acute Skin- rash-none, lesions- none, excoriation- none Lymphadenopathy- none Head- atraumatic            Eyes- Gross vision intact, PERRLA, conjunctivae and secretions clear            Ears- Hearing, canals-normal            Nose- Clear, no-Septal dev, mucus, polyps, erosion, perforation             Throat- Mallampati II , mucosa clear , drainage- none, tonsils- atrophic Neck- flexible , trachea midline, no stridor , thyroid nl, carotid no bruit Chest - symmetrical excursion , unlabored           Heart/CV-  RRR , no murmur , no gallop  , no rub, nl s1 s2                           -  JVD- none , edema- none, stasis changes- none, varices- none           Lung- clear to P&A, wheeze- none, cough- none , dullness-none, rub- none           Chest wall-  Abd- tender-no, distended-no, bowel sounds-present, HSM- no Br/ Gen/ Rectal- Not done, not indicated Extrem- cyanosis- none, clubbing, none, atrophy- none, strength- nl Neuro- grossly intact to observation

## 2014-06-05 LAB — ALLERGEN FOOD PROFILE SPECIFIC IGE
Apple: <0.1 kU/L
Corn: <0.1 kU/L
Fish Cod: <0.1 kU/L
IgE (Immunoglobulin E), Serum: 17 kU/L (ref <115)
Milk IgE: <0.1 kU/L
Orange: <0.1 kU/L
Peanut IgE: <0.1 kU/L
Soybean IgE: <0.1 kU/L
Tuna IgE: <0.1 kU/L
Wheat IgE: <0.1 kU/L

## 2014-06-05 LAB — ALLERGY FULL PROFILE
Alternaria Alternata: <0.1 kU/L
Aspergillus fumigatus, m3: <0.1 kU/L
Bahia Grass: <0.1 kU/L
Cat Dander: <0.1 kU/L
D. farinae: <0.1 kU/L
Elm IgE: <0.1 kU/L
G005 Rye, Perennial: <0.1 kU/L
G009 Red Top: <0.1 kU/L
House Dust Hollister: <0.1 kU/L
Lamb's Quarters: <0.1 kU/L
Plantain: <0.1 kU/L
Stemphylium Botryosum: <0.1 kU/L
Sycamore Tree: <0.1 kU/L

## 2014-06-06 LAB — GALACTOSE-ALPHA-1,3-GALACTOSE IGE: Galactose-alpha-1,3-galactose IgE: <0.1 kU/L (ref <0.35)

## 2014-06-12 ENCOUNTER — Telehealth: Payer: Self-pay | Admitting: *Deleted

## 2014-06-12 NOTE — Telephone Encounter (Signed)
She is welcome to come back as needed for respiratory problems. If she continues to have symptoms that seem like allergy, then we might want to double check by offering allergy skin testing.

## 2014-06-12 NOTE — Telephone Encounter (Signed)
Pt is aware. Nothing further needed 

## 2014-06-12 NOTE — Telephone Encounter (Signed)
Spoke with patient about her allergy results and she wants to know if she still needs to come back to see Dr. Annamaria Boots since Allergies do not seem to be the problem?  Please advise.

## 2014-08-06 ENCOUNTER — Encounter: Payer: Self-pay | Admitting: Internal Medicine

## 2014-08-06 ENCOUNTER — Ambulatory Visit (INDEPENDENT_AMBULATORY_CARE_PROVIDER_SITE_OTHER): Payer: Medicare Other | Admitting: Internal Medicine

## 2014-08-06 VITALS — BP 114/70 | HR 82 | Ht 67.0 in | Wt 192.6 lb

## 2014-08-06 DIAGNOSIS — J309 Allergic rhinitis, unspecified: Secondary | ICD-10-CM | POA: Diagnosis not present

## 2014-08-06 DIAGNOSIS — Z720 Tobacco use: Secondary | ICD-10-CM | POA: Diagnosis not present

## 2014-08-06 DIAGNOSIS — J302 Other seasonal allergic rhinitis: Secondary | ICD-10-CM

## 2014-08-06 DIAGNOSIS — T7840XD Allergy, unspecified, subsequent encounter: Secondary | ICD-10-CM | POA: Diagnosis not present

## 2014-08-06 DIAGNOSIS — J3089 Other allergic rhinitis: Secondary | ICD-10-CM

## 2014-08-06 NOTE — Progress Notes (Signed)
06/04/14- 63 yoFsmoker referred courtesy of Dr. Elder Negus for allergy evaluation: pt has has frequent URI. She c/o clear thick mucus with cough , not much wheezing, and sob.  Growing up in [place] she had allergic rhinitis and facial swelling. Allergy skin testing followed by 7 years of allergy shots seemed to clear her completely. She remained well until she moved to [place] several years ago. She complains of allergy-facial swelling, eyes irritated nasal discharge. These gradually gotten worse and more perennial. Symptoms are noticed especially on waking and after eating. Triggers include shellfish, melons (smell makes her throat tight), musty, and seasonal pollens. Treatment of rhinitis-no ENT surgery. Little success with nasal sprays or antihistamines. History of asthma treated with inhalers. Pneumonia in the past, but not since she began getting flu shots. Sinusitis only once. Environment: House, 2 dogs indoors, she and husband smoke, dust from current house remodeling, no mold. CXR 01/24/14 -IMPRESSION: No active cardiopulmonary disease. Electronically Signed  By: Hassan Rowan M.D.  On: 01/24/2014 15:  08/06/14-  63 yoFsmoker referred courtesy of Dr. Elder Negus for allergy evaluation: pt has has frequent URI. She c/o clear thick mucus with cough , not much wheezing, and sob.  FOLLOWS FOR: review labs with patient; pt states she has been avoiding shellfish. She states dairy products "thickening" in her throat. Still thinks shrimp and some foods cause various symptoms. She continues to feel more irritation of eyes and nose and chest since moving to [place] which she attributes to pollen, and now has a second dog. She continues to smoke. We had a long discussion about IgE mediated symptoms pertinent to the low total IgE level and negative findings on allergy profiles. I explained she could get other opinions, especially from [facility] where a broader set of allergy evaluations would be  available, especially for foods. I explained that I think most of her symptoms are irritant rather than allergic. Allergy Profiles- 06/04/14 - Total IgE 17. Foods neg. Environmental panel neg, alpha-gal neg.  ROS-see HPI Constitutional:   No-   weight loss, night sweats, fevers, chills, fatigue, lassitude. HEENT:   No-  headaches, difficulty swallowing, tooth/dental problems, sore throat,       + sneezing,+ itching, ear ache,+ nasal congestion, post nasal drip,  CV:  No-   chest pain, orthopnea, PND, swelling in lower extremities, anasarca,                                  dizziness, palpitations Resp: No-   shortness of breath with exertion or at rest.              No-   productive cough,  No non-productive cough,  No- coughing up of blood.              No-   change in color of mucus.  + wheezing.   Skin: No-   rash or lesions. GI:  No-   heartburn, indigestion, abdominal pain, nausea, vomiting,  GU:  MS:  No-   joint pain or swelling.  Neuro-     nothing unusual Psych:  No- change in mood or affect. No depression or anxiety.  No memory loss.  OBJ- Physical Exam    General- Alert, Oriented, Affect-appropriate, Distress- none acute Skin- rash-none, lesions- none, excoriation- none Lymphadenopathy- none Head- atraumatic            Eyes- Gross vision intact, PERRLA, conjunctivae and  secretions clear            Ears- Hearing, canals-normal            Nose- Clear, no-Septal dev, mucus, polyps, erosion, perforation             Throat- Mallampati II , mucosa clear , drainage- none, tonsils- atrophic Neck- flexible , trachea midline, no stridor , thyroid nl, carotid no bruit Chest - symmetrical excursion , unlabored           Heart/CV- RRR , no murmur , no gallop  , no rub, nl s1 s2                           - JVD- none , edema- none, stasis changes- none, varices- none           Lung- clear to P&A, wheeze- none, cough- none , dullness-none, rub- none           Chest wall-  Abd- t Br/ Gen/  Rectal- Not done, not indicated Extrem- cyanosis- none, clubbing, none, atrophy- none, strength- nl Neuro- grossly intact to observation

## 2014-08-06 NOTE — Patient Instructions (Signed)
Antihistamines may help Avoid foods that you find really make you uncomfortable  Another allergy office can offer another opinion if needed. Please call if we can help.

## 2014-08-07 ENCOUNTER — Encounter: Payer: Self-pay | Admitting: Internal Medicine

## 2014-08-07 NOTE — Assessment & Plan Note (Signed)
Most of her "allergy" symptoms are probably irritant related. Smoking cessation would be the most useful thing she can do.

## 2014-08-07 NOTE — Assessment & Plan Note (Signed)
We can skin test if needed, for reassessment of possible environmental respiratory allergy standard environmental precautions recommended. Antihistamines and nasal steroid sprays as needed.

## 2014-08-07 NOTE — Assessment & Plan Note (Signed)
I would predict that irritation of the eyes nose and chest would significantly improve with smoking cessation.

## 2014-08-13 ENCOUNTER — Ambulatory Visit
Admission: RE | Admit: 2014-08-13 | Discharge: 2014-08-13 | Disposition: A | Discharge status: Home / Self Care | Payer: Medicare Other | Source: Ambulatory Visit | Attending: Endocrinology | Admitting: Endocrinology

## 2014-08-13 DIAGNOSIS — E010 Iodine-deficiency related diffuse (endemic) goiter: Secondary | ICD-10-CM

## 2014-08-16 ENCOUNTER — Ambulatory Visit (INDEPENDENT_AMBULATORY_CARE_PROVIDER_SITE_OTHER): Payer: Medicare Other | Admitting: Nurse Practitioner

## 2014-08-16 ENCOUNTER — Encounter: Payer: Self-pay | Admitting: Nurse Practitioner

## 2014-08-16 VITALS — BP 123/75 | HR 77 | Ht 67.0 in | Wt 191.6 lb

## 2014-08-16 DIAGNOSIS — M25562 Pain in left knee: Secondary | ICD-10-CM | POA: Diagnosis not present

## 2014-08-16 DIAGNOSIS — R269 Unspecified abnormalities of gait and mobility: Secondary | ICD-10-CM

## 2014-08-16 NOTE — Patient Instructions (Signed)
Get back on exercise program for flexibility Be careful with ambulation Follow up yearly and prn

## 2014-08-16 NOTE — Progress Notes (Signed)
[facility]  PATIENT: [name] DOB: December 14, 1950   REASON FOR VISIT: Follow-up for gait abnormality bilateral knee pain  HISTORY FROM: Patient    HISTORY OF PRESENT ILLNESS:[name], 64 year old female returns for followup. Last seen in this office 08/16/13. She has a history of low back pain bilateral knee pain with walking difficulty. She has not had strokelike symptoms. She had past medical history of bilateral hip replacement, also had a history of bilateral knee Injury, had surgery at right knee, but no surgery on her left knee, this was due to a falling injury in 1970s.No recent falls. She is not using assistive device. MRI of the brain 03/27/2013 without acute findings, she has age-related changes. She has no new neurologic complaints. She returns for reevaluation. She has stopped her flexibility exercises for the last couple months  REVIEW OF SYSTEMS: Full 14 system review of systems performed and notable only for those listed, all others are neg:  Constitutional: neg  Cardiovascular: neg Ear/Nose/Throat: Hearing loss Skin: neg Eyes: neg Respiratory: neg Gastroitestinal: neg  Hematology/Lymphatic: neg  Endocrine: neg Musculoskeletal: Muscle cramps Allergy/Immunology: neg Neurological: neg Psychiatric: neg Sleep : neg   ALLERGIES: Allergies  Allergen Reactions  . Cortizone-10 [Hydrocortisone] Hives  . Synthroid [Levothyroxine Sodium] Anaphylaxis    Internal shaking   . Betadine [Povidone Iodine]     Aspiration   . Calcium Channel Blockers     Asthma  . Codeine     Flu-like symptoms   . Fluoride Preparations Other (See Comments)    Blisters  . Iodine Other (See Comments)    Aspiration   . Levaquin [Levofloxacin In D5w]   . Novocain [Procaine] Other (See Comments)  . Other     Beans,carbonation, dust and mold,floride,fried foods, household cleaner, melon,mushrooms,peppers,preservatives,rasisns,spicey,spinach,trees,milk  products,shellfish  . Peanuts [Peanut Oil] Swelling    Face Swelling   . Penicillins   . Pollen Extract Other (See Comments)    Hemorrhage   . Prozac [Fluoxetine Hcl]   . Shellfish Allergy Other (See Comments)    Death  . Wellbutrin [Bupropion] Hives    After 1 pill fist size bumps appeared  . Lanolin Rash    HOME MEDICATIONS: Outpatient Prescriptions Prior to Visit  Medication Sig Dispense Refill  . acetaminophen (TYLENOL) 650 MG CR tablet Take 650 mg by mouth every 8 (eight) hours as needed for pain (Takes 1-2 x weekly). Take one to two tablets every 8 hours as needed    . aspirin 325 MG tablet Take 325 mg by mouth daily.     Marland Kitchen EPINEPHrine (EPIPEN) 0.3 mg/0.3 mL DEVI Inject 0.3 mLs (0.3 mg total) into the muscle as needed. 2 Device 0  . fluticasone (FLONASE) 50 MCG/ACT nasal spray Place 1-2 sprays into both nostrils daily. 16 g 0  . Levothyroxine Sodium (TIROSINT) 88 MCG CAPS Take by mouth daily before breakfast.    . loratadine (CLARITIN) 10 MG tablet Take 10 mg by mouth daily.    . Multiple Vitamins-Calcium (ONE-A-DAY WOMENS PO) Take 1 tablet by mouth daily.    Vladimir Faster Glycol-Propyl Glycol (SYSTANE PRESERVATIVE FREE OP) Apply to eye.    . vitamin B-12 (CYANOCOBALAMIN) 1000 MCG tablet Take 1,000 mcg by mouth daily.    Marland Kitchen triamcinolone cream (KENALOG) 0.1 % Apply 1 application topically daily as needed. ONLY USES 1-2 X WEEKLY     No facility-administered medications prior to visit.    PAST MEDICAL HISTORY: Past Medical History  Diagnosis Date  . Hypersomnia,  unspecified   . Other and unspecified hyperlipidemia   . Anxiety state, unspecified   . Abdominal pain, right lower quadrant   . Abdominal pain, epigastric   . Spasm of muscle   . Unspecified tinnitus   . Asymptomatic varicose veins   . Edema   . Rash and other nonspecific skin eruption   . Herpes zoster without mention of complication   . Blood vessel replaced by other means   . Other cataract   . Lumbago   .  Insomnia, unspecified   . Pain in limb   . Optic neuritis   . Phlebitis and thrombophlebitis of other deep vessels of lower extremities   . Obesity, unspecified   . Unspecified essential hypertension   . Dizziness and giddiness   . Abnormality of gait   . Disorder of bone and cartilage, unspecified   . Abnormal weight gain   . Other abnormal blood chemistry   . Abdominal pain, epigastric   . Syncope and collapse   . Palpitations   . Encounter for long-term (current) use of other medications   . Myalgia and myositis, unspecified   . Cramp of limb   . Unspecified hypothyroidism   . Long term (current) use of anticoagulants   . Lumbago   . Muscle weakness (generalized)   . Phlebitis and thrombophlebitis of lower extremities, unspecified   . Tobacco use disorder   . Pain in limb   . Unspecified sinusitis (chronic)   . Pain in joint, site unspecified   . Herpes zoster without mention of complication     PAST SURGICAL HISTORY: Past Surgical History  Procedure Laterality Date  . Right knee  1971  . Right breast biopsy  1986  . [place] filter implant  2008    DR Bobette Mo  . Lower back  05/26/2007    DR JEFF BEANE  . Hip surgery left  03/2010    DR [facility]  . Hip surgery right  06/2010    [facility]  . Left shoulder  2012    DR CHRIS BLACKMAN     FAMILY HISTORY: Family History  Problem Relation Age of Onset  . Stroke Mother   . Kidney disease Father   . Stroke Brother     SOCIAL HISTORY: History   Social History  . Marital Status: Married    Spouse Name: [name]  . Number of Children: 0  . Years of Education: college   Occupational History  .      answers phones   Social History Main Topics  . Smoking status: Heavy Tobacco Smoker -- 1.00 packs/day    Types: Cigarettes  . Smokeless tobacco: Never Used     Comment: one pack Daily Maybe more.  . Alcohol Use: 0.6 oz/week    1 Shots of liquor per week  . Drug Use: No  . Sexual Activity: No      Comment: husband   Other Topics Concern  . Not on file   Social History Narrative   Patient  Lives at home wit her husband [name]) . Patient still works.college education.   Right handed.   Caffiene -None     PHYSICAL EXAM  Filed Vitals:   08/16/14 1418  BP: 123/75  Pulse: 77  Height: 5\' 7"  (1.702 m)  Weight: 191 lb 9.6 oz (86.909 kg)   Body mass index is 30 kg/(m^2). Generalized: Well developed, in no acute distress  Head: normocephalic and atraumatic,. Oropharynx benign  Neck: Supple,  no carotid bruits  Cardiac: Regular rate rhythm, no murmur  Musculoskeletal: No deformity   Neurological examination   Mentation: Alert oriented to time, place, history taking. Follows all commands speech and language fluent  Cranial nerve II-XII: Pupils were equal round reactive to light extraocular movements were full, visual field were full on confrontational test. Facial sensation and strength were normal. hearing was intact to finger rubbing bilaterally. Uvula tongue midline. head turning and shoulder shrug were normal and symmetric.Tongue protrusion into cheek strength was normal. Motor: normal bulk and tone, full strength in the BUE, BLE, fine finger movements normal, no pronator drift. No focal weakness Sensory: normal and symmetric to light touch, pinprick, and vibration  Coordination: finger-nose-finger, heel-to-shin bilaterally, no dysmetria Reflexes: Brachioradialis 2/2, biceps 2/2, triceps 2/2, patellar 0 right /1 left, Achilles1/1, plantar responses were flexor bilaterally. Gait and Station: Rising up from seated position without assistance, normal stance, moderate stride, good arm swing, smooth turning, able to perform tiptoe, and heel walking without difficulty. Tandem gait is mildly unsteady  DIAGNOSTIC DATA (LABS, IMAGING, TESTING) - I reviewed patient records, labs, notes, testing and imaging myself where available.  Lab Results  Component Value Date   WBC  9.3 01/23/2014   HGB 14.3 01/23/2014   HCT 42.5 01/23/2014   MCV 84 01/23/2014   PLT 249 08/23/2013      Component Value Date/Time   NA 142 01/23/2014 1657   NA 138 07/07/2010 0450   K 4.3 01/23/2014 1657   CL 102 01/23/2014 1657   CO2 24 01/23/2014 1657   GLUCOSE 95 01/23/2014 1657   GLUCOSE 139* 07/07/2010 0450   BUN 15 01/23/2014 1657   BUN 6 07/07/2010 0450   CREATININE 1.04* 01/23/2014 1657   CALCIUM 9.1 01/23/2014 1657   PROT 6.4 01/23/2014 1657   PROT 6.1 03/17/2007 1432   ALBUMIN 3.9 03/17/2007 1432   AST 11 01/23/2014 1657   ALT 10 01/23/2014 1657   ALKPHOS 83 01/23/2014 1657   BILITOT 0.2 01/23/2014 1657   GFRNONAA 57* 01/23/2014 1657   GFRAA 66 01/23/2014 1657   ASSESSMENT AND PLAN  64 y.o. year old female  has a past medical history of left optic neuritis, gait abnormality, history of bilateral knee pain and low back pain. MRI of the brain 12 2014 without acute findings just age-related changes. Her gait difficulties more likely due to knee pain and low back pain  Get back on exercise program for flexibility Be careful with ambulation Follow up yearly and prn Dennie Bible, [facility], [facility], APRN  [facility] [address], [place], [place] [address] [phone]

## 2014-08-17 ENCOUNTER — Ambulatory Visit: Payer: Medicare Other | Admitting: Nurse Practitioner

## 2014-08-17 NOTE — Progress Notes (Signed)
I have reviewed and agreed above plan. 

## 2014-08-20 ENCOUNTER — Telehealth: Payer: Self-pay

## 2014-08-20 NOTE — Telephone Encounter (Signed)
Patient wanted to let us know that we referred her to the wrong doctor. She was to be referred to an Allergist, saw Dr. Annamaria Boots (Pulmonary) did blood work for allergy's but no one told her not to take her antihistamines. Told her if she was to get skin test she would have to go out of town (like [place]). Told patient we refer patients to [facility], I would call them. Called [facility] back they do 70 skin test, that she could call them at [phone] and make appt herself because she has BC/BS Medicare, they said they don't need a doctor's referral. She appreciated this.

## 2014-08-20 NOTE — Progress Notes (Signed)
I have reviewed and agreed above plan. 

## 2014-08-25 ENCOUNTER — Emergency Department (HOSPITAL_COMMUNITY)
Admission: EM | Admit: 2014-08-25 | Discharge: 2014-08-26 | Disposition: A | Discharge status: Home / Self Care | Payer: No Typology Code available for payment source | Attending: Emergency Medicine | Admitting: Emergency Medicine

## 2014-08-25 ENCOUNTER — Encounter (HOSPITAL_COMMUNITY): Payer: Self-pay | Admitting: Emergency Medicine

## 2014-08-25 DIAGNOSIS — Z7952 Long term (current) use of systemic steroids: Secondary | ICD-10-CM | POA: Insufficient documentation

## 2014-08-25 DIAGNOSIS — Y9389 Activity, other specified: Secondary | ICD-10-CM | POA: Diagnosis not present

## 2014-08-25 DIAGNOSIS — Z8659 Personal history of other mental and behavioral disorders: Secondary | ICD-10-CM | POA: Insufficient documentation

## 2014-08-25 DIAGNOSIS — E669 Obesity, unspecified: Secondary | ICD-10-CM | POA: Diagnosis not present

## 2014-08-25 DIAGNOSIS — Y998 Other external cause status: Secondary | ICD-10-CM | POA: Insufficient documentation

## 2014-08-25 DIAGNOSIS — Z8669 Personal history of other diseases of the nervous system and sense organs: Secondary | ICD-10-CM | POA: Diagnosis not present

## 2014-08-25 DIAGNOSIS — Y92410 Unspecified street and highway as the place of occurrence of the external cause: Secondary | ICD-10-CM | POA: Insufficient documentation

## 2014-08-25 DIAGNOSIS — E039 Hypothyroidism, unspecified: Secondary | ICD-10-CM | POA: Diagnosis not present

## 2014-08-25 DIAGNOSIS — S6991XA Unspecified injury of right wrist, hand and finger(s), initial encounter: Secondary | ICD-10-CM | POA: Insufficient documentation

## 2014-08-25 DIAGNOSIS — Z720 Tobacco use: Secondary | ICD-10-CM | POA: Insufficient documentation

## 2014-08-25 DIAGNOSIS — S79911A Unspecified injury of right hip, initial encounter: Secondary | ICD-10-CM | POA: Diagnosis not present

## 2014-08-25 DIAGNOSIS — I10 Essential (primary) hypertension: Secondary | ICD-10-CM | POA: Insufficient documentation

## 2014-08-25 DIAGNOSIS — Z8619 Personal history of other infectious and parasitic diseases: Secondary | ICD-10-CM | POA: Insufficient documentation

## 2014-08-25 DIAGNOSIS — Z8709 Personal history of other diseases of the respiratory system: Secondary | ICD-10-CM | POA: Insufficient documentation

## 2014-08-25 DIAGNOSIS — Z7982 Long term (current) use of aspirin: Secondary | ICD-10-CM | POA: Diagnosis not present

## 2014-08-25 DIAGNOSIS — Z9889 Other specified postprocedural states: Secondary | ICD-10-CM | POA: Diagnosis not present

## 2014-08-25 DIAGNOSIS — S39012A Strain of muscle, fascia and tendon of lower back, initial encounter: Secondary | ICD-10-CM | POA: Diagnosis not present

## 2014-08-25 DIAGNOSIS — M25511 Pain in right shoulder: Secondary | ICD-10-CM

## 2014-08-25 DIAGNOSIS — Z8739 Personal history of other diseases of the musculoskeletal system and connective tissue: Secondary | ICD-10-CM | POA: Insufficient documentation

## 2014-08-25 DIAGNOSIS — S3992XA Unspecified injury of lower back, initial encounter: Secondary | ICD-10-CM | POA: Diagnosis present

## 2014-08-25 DIAGNOSIS — Z7951 Long term (current) use of inhaled steroids: Secondary | ICD-10-CM | POA: Insufficient documentation

## 2014-08-25 DIAGNOSIS — Z880 Allergy status to penicillin: Secondary | ICD-10-CM | POA: Insufficient documentation

## 2014-08-25 DIAGNOSIS — S4991XA Unspecified injury of right shoulder and upper arm, initial encounter: Secondary | ICD-10-CM | POA: Insufficient documentation

## 2014-08-25 DIAGNOSIS — H268 Other specified cataract: Secondary | ICD-10-CM | POA: Diagnosis not present

## 2014-08-25 DIAGNOSIS — M79644 Pain in right finger(s): Secondary | ICD-10-CM
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
## 2014-08-30 ENCOUNTER — Ambulatory Visit
Admission: RE | Admit: 2014-08-30 | Discharge: 2014-08-30 | Disposition: A | Discharge status: Home / Self Care | Payer: Medicare Other | Source: Ambulatory Visit | Attending: Nurse Practitioner | Admitting: Nurse Practitioner

## 2014-08-30 ENCOUNTER — Encounter: Payer: Self-pay | Admitting: Nurse Practitioner

## 2014-08-30 ENCOUNTER — Ambulatory Visit (INDEPENDENT_AMBULATORY_CARE_PROVIDER_SITE_OTHER): Payer: Medicare Other | Admitting: Nurse Practitioner

## 2014-08-30 VITALS — BP 114/78 | HR 73 | Temp 98.2°F | Ht 67.0 in | Wt 189.0 lb

## 2014-08-30 DIAGNOSIS — M545 Low back pain, unspecified: Secondary | ICD-10-CM

## 2014-08-30 DIAGNOSIS — M25531 Pain in right wrist: Secondary | ICD-10-CM
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
## 2014-08-30 NOTE — Patient Instructions (Signed)
Right wrist xray ordered, cont with brace and ice twice daily  -may use tylenol as needed for pain

## 2014-10-26 ENCOUNTER — Telehealth: Payer: Self-pay | Admitting: Internal Medicine

## 2014-10-26 NOTE — Telephone Encounter (Signed)
Pt called request most recent notes of last visit and labs.  Says she needed for her Gyn appointment..Mailed to patients home..[name]

## 2014-11-19 ENCOUNTER — Other Ambulatory Visit: Payer: Self-pay

## 2014-11-20 LAB — CYTOLOGY - PAP

## 2015-03-12 ENCOUNTER — Ambulatory Visit (INDEPENDENT_AMBULATORY_CARE_PROVIDER_SITE_OTHER): Payer: Medicare Other | Admitting: Nurse Practitioner

## 2015-03-12 ENCOUNTER — Encounter: Payer: Self-pay | Admitting: Nurse Practitioner

## 2015-03-12 VITALS — BP 122/76 | HR 77 | Temp 97.9°F | Ht 67.0 in | Wt 185.0 lb

## 2015-03-12 DIAGNOSIS — J019 Acute sinusitis, unspecified: Secondary | ICD-10-CM | POA: Diagnosis not present

## 2015-03-12 DIAGNOSIS — M5489 Other dorsalgia: Secondary | ICD-10-CM

## 2015-03-12 DIAGNOSIS — M791 Myalgia: Secondary | ICD-10-CM | POA: Diagnosis not present

## 2015-03-12 DIAGNOSIS — IMO0001 Reserved for inherently not codable concepts without codable children: Secondary | ICD-10-CM

## 2015-03-12 DIAGNOSIS — M609 Myositis, unspecified: Secondary | ICD-10-CM

## 2015-03-12 DIAGNOSIS — Z1159 Encounter for screening for other viral diseases: Secondary | ICD-10-CM | POA: Diagnosis not present

## 2015-03-12 MED ORDER — PREGABALIN 50 MG PO CAPS: 50.0000 mg | ORAL_CAPSULE | Freq: Three times a day (TID) | ORAL | Status: DC

## 2015-03-12 MED ORDER — DOXYCYCLINE HYCLATE 100 MG PO TABS: 100.0000 mg | ORAL_TABLET | Freq: Two times a day (BID) | ORAL | Status: DC

## 2015-03-12 MED ORDER — EPINEPHRINE 0.3 MG/0.3ML IJ SOAJ: 0.3000 mg | INTRAMUSCULAR | Status: DC | PRN

## 2015-03-12 NOTE — Patient Instructions (Signed)
Will start doxycycline 100 mg twice daily for 10 days for sinusitis Will get xray of back To start lyrica for pain start with 50 mg daily at night then twice daily then three times a day   Follow up with Dr Nyoka Cowden in ~2 weeks
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
## 2015-03-13 LAB — CBC WITH DIFFERENTIAL/PLATELET
Basophils Absolute: 0.1 10*3/uL (ref 0.0–0.2)
Basos: 1 %
EOS (ABSOLUTE): 0.1 10*3/uL (ref 0.0–0.4)
EOS: 1 %
HEMOGLOBIN: 14.7 g/dL (ref 11.1–15.9)
Hematocrit: 44.8 % (ref 34.0–46.6)
IMMATURE GRANS (ABS): 0 10*3/uL (ref 0.0–0.1)
Immature Granulocytes: 0 %
Lymphocytes Absolute: 2.3 10*3/uL (ref 0.7–3.1)
Lymphs: 29 %
MCH: 28 pg (ref 26.6–33.0)
MCHC: 32.8 g/dL (ref 31.5–35.7)
MCV: 85 fL (ref 79–97)
MONOCYTES: 7 %
MONOS ABS: 0.6 10*3/uL (ref 0.1–0.9)
NEUTROS PCT: 62 %
Neutrophils Absolute: 4.9 10*3/uL (ref 1.4–7.0)
Platelets: 294 10*3/uL (ref 150–379)
RBC: 5.25 x10E6/uL (ref 3.77–5.28)
RDW: 13.8 % (ref 12.3–15.4)
WBC: 8 10*3/uL (ref 3.4–10.8)

## 2015-03-13 LAB — COMPREHENSIVE METABOLIC PANEL
ALT: 10 IU/L (ref 0–32)
AST: 13 IU/L (ref 0–40)
Albumin/Globulin Ratio: 2 (ref 1.1–2.5)
Albumin: 4.3 g/dL (ref 3.6–4.8)
Alkaline Phosphatase: 84 IU/L (ref 39–117)
BILIRUBIN TOTAL: 0.4 mg/dL (ref 0.0–1.2)
BUN/Creatinine Ratio: 14 (ref 11–26)
BUN: 10 mg/dL (ref 8–27)
CALCIUM: 9.2 mg/dL (ref 8.7–10.3)
CHLORIDE: 102 mmol/L (ref 97–106)
CO2: 27 mmol/L (ref 18–29)
Creatinine, Ser: 0.7 mg/dL (ref 0.57–1.00)
GFR, EST AFRICAN AMERICAN: 105 mL/min/{1.73_m2} (ref >59)
GFR, EST NON AFRICAN AMERICAN: 91 mL/min/{1.73_m2} (ref >59)
GLUCOSE: 100 mg/dL — AB (ref 65–99)
Globulin, Total: 2.1 g/dL (ref 1.5–4.5)
Potassium: 4.5 mmol/L (ref 3.5–5.2)
Sodium: 143 mmol/L (ref 136–144)
TOTAL PROTEIN: 6.4 g/dL (ref 6.0–8.5)

## 2015-03-13 LAB — SEDIMENTATION RATE: SED RATE: 2 mm/h (ref 0–40)

## 2015-03-13 LAB — HEPATITIS C ANTIBODY: Hep C Virus Ab: <0.1 s/co ratio (ref 0.0–0.9)

## 2015-03-14 ENCOUNTER — Ambulatory Visit
Admission: RE | Admit: 2015-03-14 | Discharge: 2015-03-14 | Disposition: A | Discharge status: Home / Self Care | Payer: Medicare Other | Source: Ambulatory Visit | Attending: Nurse Practitioner | Admitting: Nurse Practitioner

## 2015-03-14 DIAGNOSIS — M5489 Other dorsalgia: Secondary | ICD-10-CM

## 2015-03-26 ENCOUNTER — Ambulatory Visit (INDEPENDENT_AMBULATORY_CARE_PROVIDER_SITE_OTHER): Payer: Medicare Other | Admitting: Internal Medicine

## 2015-03-26 ENCOUNTER — Encounter: Payer: Self-pay | Admitting: Internal Medicine

## 2015-03-26 VITALS — BP 110/80 | HR 78 | Temp 97.8°F | Resp 20 | Ht 67.0 in | Wt 192.0 lb

## 2015-03-26 DIAGNOSIS — E039 Hypothyroidism, unspecified: Secondary | ICD-10-CM | POA: Diagnosis not present

## 2015-03-26 DIAGNOSIS — M791 Myalgia: Secondary | ICD-10-CM

## 2015-03-26 DIAGNOSIS — I10 Essential (primary) hypertension: Secondary | ICD-10-CM | POA: Diagnosis not present

## 2015-03-26 DIAGNOSIS — IMO0001 Reserved for inherently not codable concepts without codable children: Secondary | ICD-10-CM

## 2015-03-26 DIAGNOSIS — M609 Myositis, unspecified: Secondary | ICD-10-CM | POA: Diagnosis not present

## 2015-03-26 DIAGNOSIS — J209 Acute bronchitis, unspecified: Secondary | ICD-10-CM

## 2015-03-26 NOTE — Patient Instructions (Signed)
Take the Lyrica all at night.
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
## 2015-04-05 ENCOUNTER — Telehealth: Payer: Self-pay | Admitting: *Deleted

## 2015-04-05 NOTE — Telephone Encounter (Signed)
It is not really indicated as sleep medication, but it will not hurt her to use it.

## 2015-04-05 NOTE — Telephone Encounter (Signed)
Patient notified and understood.

## 2015-04-05 NOTE — Telephone Encounter (Signed)
Patient called and stated that she had to come off of the Lyrica due to Horrible mood swings but now she can't sleep. Patient wants to know if she can take the Lyrica every now and then to help her sleep or will this hurt her? Please Advise.

## 2015-05-01 ENCOUNTER — Encounter: Payer: Self-pay | Admitting: Internal Medicine

## 2015-05-01 ENCOUNTER — Ambulatory Visit (INDEPENDENT_AMBULATORY_CARE_PROVIDER_SITE_OTHER): Payer: PPO | Admitting: Internal Medicine

## 2015-05-01 VITALS — BP 120/72 | HR 82 | Temp 98.0°F | Resp 20 | Ht 67.0 in | Wt 186.2 lb

## 2015-05-01 DIAGNOSIS — G8929 Other chronic pain: Secondary | ICD-10-CM

## 2015-05-01 DIAGNOSIS — J209 Acute bronchitis, unspecified: Secondary | ICD-10-CM | POA: Diagnosis not present

## 2015-05-01 DIAGNOSIS — I10 Essential (primary) hypertension: Secondary | ICD-10-CM | POA: Diagnosis not present

## 2015-05-01 DIAGNOSIS — H469 Unspecified optic neuritis: Secondary | ICD-10-CM | POA: Insufficient documentation
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
## 2015-06-04 ENCOUNTER — Ambulatory Visit (INDEPENDENT_AMBULATORY_CARE_PROVIDER_SITE_OTHER): Payer: PPO | Admitting: Internal Medicine

## 2015-06-04 VITALS — BP 110/72 | HR 73 | Temp 98.0°F | Resp 20 | Ht 67.0 in | Wt 187.8 lb

## 2015-06-04 DIAGNOSIS — R269 Unspecified abnormalities of gait and mobility: Secondary | ICD-10-CM | POA: Diagnosis not present

## 2015-06-04 DIAGNOSIS — M5441 Lumbago with sciatica, right side: Secondary | ICD-10-CM | POA: Diagnosis not present

## 2015-06-04 DIAGNOSIS — M545 Low back pain, unspecified: Secondary | ICD-10-CM | POA: Insufficient documentation

## 2015-06-04 DIAGNOSIS — I10 Essential (primary) hypertension: Secondary | ICD-10-CM

## 2015-06-04 DIAGNOSIS — E039 Hypothyroidism, unspecified: Secondary | ICD-10-CM

## 2015-06-04 DIAGNOSIS — M5442 Lumbago with sciatica, left side: Secondary | ICD-10-CM
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
## 2015-06-06 ENCOUNTER — Other Ambulatory Visit: Payer: Self-pay | Admitting: *Deleted

## 2015-06-06 DIAGNOSIS — M5441 Lumbago with sciatica, right side: Secondary | ICD-10-CM

## 2015-06-06 DIAGNOSIS — M549 Dorsalgia, unspecified: Secondary | ICD-10-CM

## 2015-06-07 ENCOUNTER — Other Ambulatory Visit: Payer: Self-pay | Admitting: Internal Medicine

## 2015-06-10 ENCOUNTER — Ambulatory Visit
Admission: RE | Admit: 2015-06-10 | Discharge: 2015-06-10 | Disposition: A | Discharge status: Home / Self Care | Payer: PPO | Source: Ambulatory Visit | Attending: Internal Medicine | Admitting: Internal Medicine

## 2015-06-10 DIAGNOSIS — M5126 Other intervertebral disc displacement, lumbar region: Secondary | ICD-10-CM | POA: Diagnosis not present

## 2015-06-10 DIAGNOSIS — M5441 Lumbago with sciatica, right side: Secondary | ICD-10-CM

## 2015-06-12 ENCOUNTER — Telehealth: Payer: Self-pay | Admitting: Internal Medicine

## 2015-06-12 NOTE — Telephone Encounter (Signed)
Called patient, left message to call regarding overdue mammogram. Sending letter...cdavis

## 2015-06-24 ENCOUNTER — Other Ambulatory Visit: Payer: Self-pay

## 2015-06-24 DIAGNOSIS — M5416 Radiculopathy, lumbar region: Secondary | ICD-10-CM

## 2015-07-02 ENCOUNTER — Encounter: Payer: Self-pay | Admitting: Internal Medicine

## 2015-07-02 ENCOUNTER — Ambulatory Visit (INDEPENDENT_AMBULATORY_CARE_PROVIDER_SITE_OTHER): Payer: PPO | Admitting: Internal Medicine

## 2015-07-02 VITALS — BP 132/82 | HR 71 | Temp 98.1°F | Resp 20 | Ht 67.0 in | Wt 189.8 lb

## 2015-07-02 DIAGNOSIS — M5442 Lumbago with sciatica, left side: Secondary | ICD-10-CM | POA: Diagnosis not present

## 2015-07-02 DIAGNOSIS — I10 Essential (primary) hypertension: Secondary | ICD-10-CM | POA: Diagnosis not present

## 2015-07-02 DIAGNOSIS — J209 Acute bronchitis, unspecified: Secondary | ICD-10-CM

## 2015-07-02 DIAGNOSIS — R059 Cough, unspecified: Secondary | ICD-10-CM

## 2015-07-02 DIAGNOSIS — M5441 Lumbago with sciatica, right side: Secondary | ICD-10-CM | POA: Diagnosis not present

## 2015-07-02 DIAGNOSIS — R05 Cough: Secondary | ICD-10-CM

## 2015-07-02 MED ORDER — DOXYCYCLINE HYCLATE 100 MG PO TABS: ORAL_TABLET | ORAL | Status: DC
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
## 2015-07-10 DIAGNOSIS — Z6829 Body mass index (BMI) 29.0-29.9, adult: Secondary | ICD-10-CM | POA: Diagnosis not present

## 2015-07-10 DIAGNOSIS — M5136 Other intervertebral disc degeneration, lumbar region: Secondary | ICD-10-CM | POA: Diagnosis not present

## 2015-07-16 DIAGNOSIS — E039 Hypothyroidism, unspecified: Secondary | ICD-10-CM | POA: Diagnosis not present

## 2015-07-17 ENCOUNTER — Telehealth: Payer: Self-pay | Admitting: *Deleted

## 2015-07-17 NOTE — Telephone Encounter (Signed)
Patient called and stated that her URI is no better. Patient has finished her antibiotics and still coughing and totally exhausted. Please Advise.

## 2015-07-17 NOTE — Telephone Encounter (Signed)
Patient notified and agreed. Does not want cough medication called in and no fever.

## 2015-07-17 NOTE — Telephone Encounter (Signed)
Respiratory infections likely viral in origin. If she is not running a fever greater than 101, there is symptomatic treatment. Do we need to call in something for her cough? I do not think additional antibiotics will be helpful in this situation. If she is running fevers greater than 101, we need to get a chest x-ray.

## 2015-08-07 ENCOUNTER — Ambulatory Visit: Payer: PPO | Attending: Neurosurgery | Admitting: Physical Therapy

## 2015-08-07 ENCOUNTER — Encounter: Payer: Self-pay | Admitting: Physical Therapy

## 2015-08-07 DIAGNOSIS — M545 Low back pain, unspecified: Secondary | ICD-10-CM
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
## 2015-08-14 ENCOUNTER — Ambulatory Visit: Payer: PPO | Admitting: Physical Therapy

## 2015-08-14 ENCOUNTER — Encounter: Payer: Self-pay | Admitting: Physical Therapy

## 2015-08-14 DIAGNOSIS — M545 Low back pain, unspecified: Secondary | ICD-10-CM

## 2015-08-14 NOTE — Therapy (Signed)
[place], [place], [address] Phone: [phone]   Fax:  [phone]  Physical Therapy Treatment  Patient Details  Name: [name] MRN: [id number] Date of Birth: [birth year] No Data Recorded  Encounter Date: 08/14/2015      PT End of Session - 08/14/15 1525    Visit Number 2   Date for PT Re-Evaluation 10/07/15   PT Start Time 1452   PT Stop Time 1534   PT Time Calculation (min) 42 min   Activity Tolerance Patient tolerated treatment well   Behavior During Therapy [facility] for tasks assessed/performed      Past Medical History  Diagnosis Date  . Hypersomnia, unspecified   . Other and unspecified hyperlipidemia   . Anxiety state, unspecified   . Abdominal pain, right lower quadrant   . Abdominal pain, epigastric   . Spasm of muscle   . Unspecified tinnitus   . Asymptomatic varicose veins   . Edema   . Rash and other nonspecific skin eruption   . Herpes zoster without mention of complication   . Blood vessel replaced by other means   . Other cataract   . Lumbago   . Insomnia, unspecified   . Pain in limb   . Optic neuritis   . Phlebitis and thrombophlebitis of other deep vessels of lower extremities   . Obesity, unspecified   . Unspecified essential hypertension   . Dizziness and giddiness   . Abnormality of gait   . Disorder of bone and cartilage, unspecified   . Abnormal weight gain   . Other abnormal blood chemistry   . Abdominal pain, epigastric   . Syncope and collapse   . Palpitations   . Encounter for long-term (current) use of other medications   . Myalgia and myositis, unspecified   . Cramp of limb   . Unspecified hypothyroidism   . Long term (current) use of anticoagulants   . Lumbago   . Muscle weakness (generalized)   . Phlebitis and thrombophlebitis of lower extremities, unspecified   . Tobacco use disorder   . Pain in limb   . Unspecified sinusitis (chronic)    . Pain in joint, site unspecified   . Herpes zoster without mention of complication     Past Surgical History  Procedure Laterality Date  . Right knee  1971  . Right breast biopsy  1986  . Greenwood filter implant  2008    DR Bobette Mo  . Lower back  05/26/2007    DR JEFF BEANE  . Hip surgery left  03/2010    DR [facility]  . Hip surgery right  06/2010    DR [facility]  . Left shoulder  2012    DR CHRIS BLACKMAN     There were no vitals filed for this visit.      Subjective Assessment - 08/14/15 1453    Subjective I got really sore after last time.  My neck is sore after the exercises.   Currently in Pain? Yes   Pain Score 8    Pain Location Back  and c/o neck   Aggravating Factors  exercises seem to have increased the pain                         [facility] Adult PT Treatment/Exercise - 08/14/15 0001    Exercises   Exercises Lumbar   Lumbar Exercises: Stretches  Passive Hamstring Stretch 20 seconds;3 reps   Single Knee to Chest Stretch 3 reps;10 seconds   Double Knee to Chest Stretch 10 seconds;3 reps   Lower Trunk Rotation 3 reps;10 seconds   Lumbar Exercises: Aerobic   Stationary Bike NuStep level 1 x 5 minutes   Lumbar Exercises: Supine   Other Supine Lumbar Exercises feet on ball K2C, trunk rotaiton and small bridges, isometric abdominal squeeze   Modalities   Modalities Ultrasound   Ultrasound   Ultrasound Location lumbar in sidelying   Ultrasound Parameters 100% 1MHz 1.6 w/cm2   Ultrasound Goals Pain                  PT Short Term Goals - 08/07/15 1620    PT SHORT TERM GOAL #1   Title indepednent with initial HEP   Time 1   Period Weeks   Status New           PT Long Term Goals - 08/07/15 1657    PT LONG TERM GOAL #1   Title decrease pain 50%   Time 8   Period Weeks   Status New   PT LONG TERM GOAL #2   Title increase lumbar ROM 25%   Time 8   Period Weeks   Status New   PT LONG TERM GOAL #3   Title  report that she is able to do the housework without pain >5/10   Time 8   Period Weeks   Status New   PT LONG TERM GOAL #4   Title understand proper posture and body mechanics   Time 8   Period Weeks   Status New               Plan - 08/14/15 1609    Clinical Impression Statement Patient with increased pain after evaluation, very painful transitional motions.  Difficulty with movements due to pain.  Did well with the exercises but the movements b/n exercises were painful   PT Next Visit Plan Slowly add exercises, and flexibility, modalities as needed   Consulted and Agree with Plan of Care Patient      Patient will benefit from skilled therapeutic intervention in order to improve the following deficits and impairments:  Decreased range of motion, Decreased strength, Increased muscle spasms, Impaired flexibility, Pain  Visit Diagnosis: Bilateral low back pain without sciatica     Problem List Patient Active Problem List   Diagnosis Date Noted  . Acute bronchitis 07/02/2015  . Low back pain 06/04/2015  . Optic neuritis 05/01/2015  . Chronic pain 05/01/2015  . Tobacco abuse 06/04/2014  . Asthma with bronchitis 06/04/2014  . H/O cardiovascular disorder 03/17/2014  . Anterior optic neuritis 03/17/2014  . Seasonal and perennial allergic rhinitis 03/06/2014  . H/O thrombosis 12/24/2013  . Myalgia and myositis 08/23/2013  . Cough 08/23/2013  . Non-toxic uninodular goiter 03/07/2013  . Abnormality of gait 11/22/2012  . Allergy 11/16/2012  . Hypothyroidism 08/31/2012  . Essential hypertension 08/31/2012  . Personal history of fall 08/31/2012  . Pain in joint, lower leg 08/31/2012  . Obstructive sleep apnea 08/31/2012    Sumner Boast., PT 08/14/2015, 4:11 PM  [address] [place], [place], [address] Phone: [phone]   Fax:  [phone]  Name: [name] MRN: [id number] Date  of Birth: 1951/01/17

## 2015-08-19 ENCOUNTER — Encounter: Payer: Self-pay | Admitting: Nurse Practitioner

## 2015-08-19 ENCOUNTER — Ambulatory Visit (INDEPENDENT_AMBULATORY_CARE_PROVIDER_SITE_OTHER): Payer: PPO | Admitting: Nurse Practitioner

## 2015-08-19 VITALS — BP 126/76 | HR 79 | Ht 67.0 in | Wt 193.0 lb

## 2015-08-19 DIAGNOSIS — R269 Unspecified abnormalities of gait and mobility: Secondary | ICD-10-CM

## 2015-08-19 DIAGNOSIS — M25562 Pain in left knee: Secondary | ICD-10-CM | POA: Diagnosis not present
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
## 2015-08-19 NOTE — Patient Instructions (Signed)
Continue Physical therapy Be careful with ambulation Follow up yearly and prn

## 2015-08-19 NOTE — Progress Notes (Signed)
I have reviewed and agreed above plan. 

## 2015-08-20 ENCOUNTER — Ambulatory Visit: Payer: PPO | Admitting: Physical Therapy

## 2015-08-20 ENCOUNTER — Encounter: Payer: Self-pay | Admitting: Physical Therapy

## 2015-08-20 DIAGNOSIS — M545 Low back pain, unspecified: Secondary | ICD-10-CM

## 2015-08-20 NOTE — Therapy (Signed)
[place] [place], [place], [address] Phone: [phone]   Fax:  [phone]  Physical Therapy Treatment  Patient Details  Name: [name] MRN: [id number] Date of Birth: Nov 06, 1950 No Data Recorded  Encounter Date: 08/20/2015      PT End of Session - 08/20/15 1529    Visit Number 3   Date for PT Re-Evaluation 10/07/15   PT Start Time 1446   PT Stop Time 1530   PT Time Calculation (min) 44 min   Activity Tolerance Patient tolerated treatment well   Behavior During Therapy [facility] for tasks assessed/performed      Past Medical History  Diagnosis Date  . Hypersomnia, unspecified   . Other and unspecified hyperlipidemia   . Anxiety state, unspecified   . Abdominal pain, right lower quadrant   . Abdominal pain, epigastric   . Spasm of muscle   . Unspecified tinnitus   . Asymptomatic varicose veins   . Edema   . Rash and other nonspecific skin eruption   . Herpes zoster without mention of complication   . Blood vessel replaced by other means   . Other cataract   . Lumbago   . Insomnia, unspecified   . Pain in limb   . Optic neuritis   . Phlebitis and thrombophlebitis of other deep vessels of lower extremities   . Obesity, unspecified   . Unspecified essential hypertension   . Dizziness and giddiness   . Abnormality of gait   . Disorder of bone and cartilage, unspecified   . Abnormal weight gain   . Other abnormal blood chemistry   . Abdominal pain, epigastric   . Syncope and collapse   . Palpitations   . Encounter for long-term (current) use of other medications   . Myalgia and myositis, unspecified   . Cramp of limb   . Unspecified hypothyroidism   . Long term (current) use of anticoagulants   . Lumbago   . Muscle weakness (generalized)   . Phlebitis and thrombophlebitis of lower extremities, unspecified   . Tobacco use disorder   . Pain in limb   . Unspecified sinusitis (chronic)    . Pain in joint, site unspecified   . Herpes zoster without mention of complication     Past Surgical History  Procedure Laterality Date  . Right knee  1971  . Right breast biopsy  1986  . Greenwood filter implant  2008    DR Bobette Mo  . Lower back  05/26/2007    DR JEFF BEANE  . Hip surgery left  03/2010    DR [facility]  . Hip surgery right  06/2010    DR [facility]  . Left shoulder  2012    DR CHRIS BLACKMAN     There were no vitals filed for this visit.      Subjective Assessment - 08/20/15 1448    Subjective I felt pretty good after last time   Currently in Pain? Yes   Pain Score 7    Pain Location Back   Pain Orientation Lower            [facility] PT Assessment - 08/20/15 0001    AROM   Overall AROM Comments Flesion of lumbar WNL's, extension decreased 50% with pain in the low back   Strength   Overall Strength Comments 4-/5 for LE's  [facility] Adult PT Treatment/Exercise - 08/20/15 0001    Lumbar Exercises: Stretches   Passive Hamstring Stretch 20 seconds;3 reps   Single Knee to Chest Stretch 3 reps;10 seconds   Double Knee to Chest Stretch 10 seconds;3 reps   Lower Trunk Rotation 3 reps;10 seconds   Piriformis Stretch 3 reps;20 seconds   Lumbar Exercises: Aerobic   Stationary Bike NuStep level 1 x 5 minutes   Lumbar Exercises: Machines for Strengthening   Other Lumbar Machine Exercise seated row 10#   Lumbar Exercises: Supine   Other Supine Lumbar Exercises feet on ball K2C, trunk rotaiton and small bridges, isometric abdominal squeeze   Ultrasound   Ultrasound Location Lumbar in sidelying   Ultrasound Parameters 100% 1MHz 1.5 w/cm2   Ultrasound Goals Pain                  PT Short Term Goals - 08/20/15 1531    PT SHORT TERM GOAL #1   Title indepednent with initial HEP   Status Achieved           PT Long Term Goals - 08/07/15 1657    PT LONG TERM GOAL #1   Title decrease pain 50%   Time 8    Period Weeks   Status New   PT LONG TERM GOAL #2   Title increase lumbar ROM 25%   Time 8   Period Weeks   Status New   PT LONG TERM GOAL #3   Title report that she is able to do the housework without pain >5/10   Time 8   Period Weeks   Status New   PT LONG TERM GOAL #4   Title understand proper posture and body mechanics   Time 8   Period Weeks   Status New               Plan - 08/20/15 1529    Clinical Impression Statement When we went over the HEP there were two things she was doing wrong, needed some cues to do correctly.  She is reporting that she was able to have minimal pain yesterday   PT Next Visit Plan Slowly add exercises, and flexibility, modalities as needed   Consulted and Agree with Plan of Care Patient      Patient will benefit from skilled therapeutic intervention in order to improve the following deficits and impairments:     Visit Diagnosis: Bilateral low back pain without sciatica     Problem List Patient Active Problem List   Diagnosis Date Noted  . Acute bronchitis 07/02/2015  . Low back pain 06/04/2015  . Optic neuritis 05/01/2015  . Chronic pain 05/01/2015  . Tobacco abuse 06/04/2014  . Asthma with bronchitis 06/04/2014  . H/O cardiovascular disorder 03/17/2014  . Anterior optic neuritis 03/17/2014  . Seasonal and perennial allergic rhinitis 03/06/2014  . H/O thrombosis 12/24/2013  . Myalgia and myositis 08/23/2013  . Cough 08/23/2013  . Non-toxic uninodular goiter 03/07/2013  . Abnormality of gait 11/22/2012  . Allergy 11/16/2012  . Hypothyroidism 08/31/2012  . Essential hypertension 08/31/2012  . Personal history of fall 08/31/2012  . Pain in joint, lower leg 08/31/2012  . Obstructive sleep apnea 08/31/2012    Sumner Boast., PT 08/20/2015, 3:32 PM  [address] [place], [place], [address] Phone: [phone]   Fax:  [phone]  Name:  [name] MRN: [id number] Date of Birth: June 19, 1950

## 2015-08-22 ENCOUNTER — Ambulatory Visit: Payer: PPO | Admitting: Physical Therapy

## 2015-08-22 DIAGNOSIS — R635 Abnormal weight gain: Secondary | ICD-10-CM | POA: Diagnosis not present

## 2015-08-22 DIAGNOSIS — E039 Hypothyroidism, unspecified: Secondary | ICD-10-CM | POA: Diagnosis not present

## 2015-08-22 DIAGNOSIS — E049 Nontoxic goiter, unspecified: Secondary | ICD-10-CM | POA: Diagnosis not present

## 2015-08-26 ENCOUNTER — Ambulatory Visit: Payer: PPO | Attending: Neurosurgery | Admitting: Physical Therapy

## 2015-08-26 DIAGNOSIS — M545 Low back pain, unspecified: Secondary | ICD-10-CM

## 2015-08-26 NOTE — Therapy (Signed)
[place], [place], [address] Phone: [phone]   Fax:  [phone]  Physical Therapy Treatment  Patient Details  Name: [name] MRN: [id number] Date of Birth: 08-02-1950 No Data Recorded  Encounter Date: 08/26/2015      PT End of Session - 08/26/15 1350    Visit Number 4   Date for PT Re-Evaluation 10/07/15   PT Start Time 1310  Patient reports that she got bad news and needs to drive to [place], wanted to cut treatment short   PT Stop Time 1345   PT Time Calculation (min) 35 min   Activity Tolerance Patient tolerated treatment well   Behavior During Therapy [facility] for tasks assessed/performed      Past Medical History  Diagnosis Date  . Hypersomnia, unspecified   . Other and unspecified hyperlipidemia   . Anxiety state, unspecified   . Abdominal pain, right lower quadrant   . Abdominal pain, epigastric   . Spasm of muscle   . Unspecified tinnitus   . Asymptomatic varicose veins   . Edema   . Rash and other nonspecific skin eruption   . Herpes zoster without mention of complication   . Blood vessel replaced by other means   . Other cataract   . Lumbago   . Insomnia, unspecified   . Pain in limb   . Optic neuritis   . Phlebitis and thrombophlebitis of other deep vessels of lower extremities   . Obesity, unspecified   . Unspecified essential hypertension   . Dizziness and giddiness   . Abnormality of gait   . Disorder of bone and cartilage, unspecified   . Abnormal weight gain   . Other abnormal blood chemistry   . Abdominal pain, epigastric   . Syncope and collapse   . Palpitations   . Encounter for long-term (current) use of other medications   . Myalgia and myositis, unspecified   . Cramp of limb   . Unspecified hypothyroidism   . Long term (current) use of anticoagulants   . Lumbago   . Muscle weakness (generalized)   . Phlebitis and thrombophlebitis of lower  extremities, unspecified   . Tobacco use disorder   . Pain in limb   . Unspecified sinusitis (chronic)   . Pain in joint, site unspecified   . Herpes zoster without mention of complication     Past Surgical History  Procedure Laterality Date  . Right knee  1971  . Right breast biopsy  1986  . Greenwood filter implant  2008    DR Bobette Mo  . Lower back  05/26/2007    DR JEFF BEANE  . Hip surgery left  03/2010    DR [facility]  . Hip surgery right  06/2010    DR [facility]  . Left shoulder  2012    DR CHRIS BLACKMAN     There were no vitals filed for this visit.      Subjective Assessment - 08/26/15 1316    Subjective Patient reports that she cannot remember what she did, but something she did over the weekend caused an increase of pain.   Currently in Pain? Yes   Pain Score 6    Pain Location Back   Pain Orientation Lower   Pain Descriptors / Indicators Aching  [place] Adult PT Treatment/Exercise - 08/26/15 0001    Lumbar Exercises: Stretches   Passive Hamstring Stretch 20 seconds;3 reps   Single Knee to Chest Stretch 3 reps;10 seconds   Double Knee to Chest Stretch 10 seconds;3 reps   Lower Trunk Rotation 3 reps;10 seconds   ITB Stretch 3 reps;20 seconds   Piriformis Stretch 3 reps;20 seconds   Lumbar Exercises: Aerobic   Stationary Bike NuStep level 1 x 5 minutes   Lumbar Exercises: Machines for Strengthening   Other Lumbar Machine Exercise seated row 10#   Lumbar Exercises: Supine   Other Supine Lumbar Exercises feet on ball K2C, trunk rotaiton and small bridges, isometric abdominal squeeze   Ultrasound   Ultrasound Location Lumbar   Ultrasound Parameters 100% 1MHz 1.5 w/cm2   Ultrasound Goals Pain   Manual Therapy   Manual therapy comments PROM to LE's                  PT Short Term Goals - 08/20/15 1531    PT SHORT TERM GOAL #1   Title indepednent with initial HEP   Status Achieved            PT Long Term Goals - 08/07/15 1657    PT LONG TERM GOAL #1   Title decrease pain 50%   Time 8   Period Weeks   Status New   PT LONG TERM GOAL #2   Title increase lumbar ROM 25%   Time 8   Period Weeks   Status New   PT LONG TERM GOAL #3   Title report that she is able to do the housework without pain >5/10   Time 8   Period Weeks   Status New   PT LONG TERM GOAL #4   Title understand proper posture and body mechanics   Time 8   Period Weeks   Status New               Plan - 08/26/15 1351    Clinical Impression Statement Patient reports that she remembered what caused pain, she went to [place] and after some testing of strength and coordination she had some increased LBP.   PT Next Visit Plan Slowly add exercises, and flexibility, modalities as needed   Consulted and Agree with Plan of Care Patient      Patient will benefit from skilled therapeutic intervention in order to improve the following deficits and impairments:     Visit Diagnosis: Bilateral low back pain without sciatica     Problem List Patient Active Problem List   Diagnosis Date Noted  . Acute bronchitis 07/02/2015  . Low back pain 06/04/2015  . Optic neuritis 05/01/2015  . Chronic pain 05/01/2015  . Tobacco abuse 06/04/2014  . Asthma with bronchitis 06/04/2014  . H/O cardiovascular disorder 03/17/2014  . Anterior optic neuritis 03/17/2014  . Seasonal and perennial allergic rhinitis 03/06/2014  . H/O thrombosis 12/24/2013  . Myalgia and myositis 08/23/2013  . Cough 08/23/2013  . Non-toxic uninodular goiter 03/07/2013  . Abnormality of gait 11/22/2012  . Allergy 11/16/2012  . Hypothyroidism 08/31/2012  . Essential hypertension 08/31/2012  . Personal history of fall 08/31/2012  . Pain in joint, lower leg 08/31/2012  . Obstructive sleep apnea 08/31/2012    Sumner Boast., PT 08/26/2015, 1:53 PM  [place] [address]  [place], [place], [address] Phone: [phone]   Fax:  [phone]  Name: [name] MRN:  [id number] Date of Birth: [birth year]

## 2015-08-29 DIAGNOSIS — M5412 Radiculopathy, cervical region: Secondary | ICD-10-CM | POA: Diagnosis not present

## 2015-08-29 DIAGNOSIS — M5136 Other intervertebral disc degeneration, lumbar region: Secondary | ICD-10-CM | POA: Diagnosis not present

## 2015-09-10 ENCOUNTER — Encounter: Payer: Self-pay | Admitting: Physical Therapy

## 2015-09-10 ENCOUNTER — Ambulatory Visit: Payer: PPO | Admitting: Physical Therapy

## 2015-09-10 DIAGNOSIS — M545 Low back pain, unspecified: Secondary | ICD-10-CM

## 2015-09-10 NOTE — Therapy (Signed)
[place], [place], [address] Phone: [phone]   Fax:  [phone]  Physical Therapy Treatment  Patient Details  Name: [name] MRN: [id number] Date of Birth: 1950-11-05 No Data Recorded  Encounter Date: 09/10/2015      PT End of Session - 09/10/15 1609    Visit Number 5   Date for PT Re-Evaluation 10/07/15   PT Start Time 1530   PT Stop Time 1613   PT Time Calculation (min) 43 min   Activity Tolerance Patient tolerated treatment well   Behavior During Therapy [facility] for tasks assessed/performed      Past Medical History  Diagnosis Date  . Hypersomnia, unspecified   . Other and unspecified hyperlipidemia   . Anxiety state, unspecified   . Abdominal pain, right lower quadrant   . Abdominal pain, epigastric   . Spasm of muscle   . Unspecified tinnitus   . Asymptomatic varicose veins   . Edema   . Rash and other nonspecific skin eruption   . Herpes zoster without mention of complication   . Blood vessel replaced by other means   . Other cataract   . Lumbago   . Insomnia, unspecified   . Pain in limb   . Optic neuritis   . Phlebitis and thrombophlebitis of other deep vessels of lower extremities   . Obesity, unspecified   . Unspecified essential hypertension   . Dizziness and giddiness   . Abnormality of gait   . Disorder of bone and cartilage, unspecified   . Abnormal weight gain   . Other abnormal blood chemistry   . Abdominal pain, epigastric   . Syncope and collapse   . Palpitations   . Encounter for long-term (current) use of other medications   . Myalgia and myositis, unspecified   . Cramp of limb   . Unspecified hypothyroidism   . Long term (current) use of anticoagulants   . Lumbago   . Muscle weakness (generalized)   . Phlebitis and thrombophlebitis of lower extremities, unspecified   . Tobacco use disorder   . Pain in limb   . Unspecified sinusitis (chronic)    . Pain in joint, site unspecified   . Herpes zoster without mention of complication     Past Surgical History  Procedure Laterality Date  . Right knee  1971  . Right breast biopsy  1986  . Greenwood filter implant  2008    DR Bobette Mo  . Lower back  05/26/2007    DR JEFF BEANE  . Hip surgery left  03/2010    DR [facility]  . Hip surgery right  06/2010    DR [facility]  . Left shoulder  2012    DR CHRIS BLACKMAN     There were no vitals filed for this visit.      Subjective Assessment - 09/10/15 1533    Subjective Patient reports that she was doing very well with much less pain, until she changed the sheets.   Currently in Pain? Yes   Pain Score 4    Pain Location Back   Pain Orientation Lower   Pain Descriptors / Indicators Aching   Pain Type Chronic pain   Aggravating Factors  changing bed sheets   Pain Relieving Factors the treatment helps                         [facility] Adult  PT Treatment/Exercise - 09/10/15 0001    Ambulation/Gait   Gait Comments gait around building (outside) brisk pace, needed assist to negotiate curb   Therapeutic Activites    Therapeutic Activities ADL's   ADL's went over body mechanics and how to change bed   Lumbar Exercises: Stretches   Passive Hamstring Stretch 20 seconds;3 reps   Lower Trunk Rotation 3 reps;10 seconds   ITB Stretch 3 reps;20 seconds   Piriformis Stretch 3 reps;20 seconds   Lumbar Exercises: Aerobic   Stationary Bike NuStep level 3 x 5 minutes   Lumbar Exercises: Supine   Other Supine Lumbar Exercises feet on ball K2C, trunk rotaiton and small bridges, isometric abdominal squeeze   Ultrasound   Ultrasound Location lumbar   Ultrasound Parameters 100% 1.5w/cm2   Ultrasound Goals Pain                  PT Short Term Goals - 08/20/15 1531    PT SHORT TERM GOAL #1   Title indepednent with initial HEP   Status Achieved           PT Long Term Goals - 09/10/15 1610    PT LONG  TERM GOAL #1   Title decrease pain 50%   Status Partially Met   PT LONG TERM GOAL #2   Title increase lumbar ROM 25%   Status Partially Met   PT LONG TERM GOAL #3   Title report that she is able to do the housework without pain >5/10   Status On-going   PT LONG TERM GOAL #4   Title understand proper posture and body mechanics   Status Achieved               Plan - 09/10/15 1609    Clinical Impression Statement Reports doing better overall, able to do more with less pain, verbalized understanding of the body mechanics that we talked about today.   PT Next Visit Plan Slowly add exercises, and flexibility, modalities as needed   Consulted and Agree with Plan of Care Patient      Patient will benefit from skilled therapeutic intervention in order to improve the following deficits and impairments:  Decreased range of motion, Decreased strength, Increased muscle spasms, Impaired flexibility, Pain  Visit Diagnosis: Bilateral low back pain without sciatica     Problem List Patient Active Problem List   Diagnosis Date Noted  . Acute bronchitis 07/02/2015  . Low back pain 06/04/2015  . Optic neuritis 05/01/2015  . Chronic pain 05/01/2015  . Tobacco abuse 06/04/2014  . Asthma with bronchitis 06/04/2014  . H/O cardiovascular disorder 03/17/2014  . Anterior optic neuritis 03/17/2014  . Seasonal and perennial allergic rhinitis 03/06/2014  . H/O thrombosis 12/24/2013  . Myalgia and myositis 08/23/2013  . Cough 08/23/2013  . Non-toxic uninodular goiter 03/07/2013  . Abnormality of gait 11/22/2012  . Allergy 11/16/2012  . Hypothyroidism 08/31/2012  . Essential hypertension 08/31/2012  . Personal history of fall 08/31/2012  . Pain in joint, lower leg 08/31/2012  . Obstructive sleep apnea 08/31/2012    Sumner Boast., PT 09/10/2015, 4:11 PM  [address] [place], [place], [address] Phone:  [phone]   Fax:  [phone]  Name: [name] MRN: [id number] Date of Birth: 18-Jun-1950

## 2015-09-12 DIAGNOSIS — L720 Epidermal cyst: Secondary | ICD-10-CM | POA: Diagnosis not present

## 2015-09-12 DIAGNOSIS — L821 Other seborrheic keratosis: Secondary | ICD-10-CM | POA: Diagnosis not present

## 2015-09-12 DIAGNOSIS — L719 Rosacea, unspecified: Secondary | ICD-10-CM | POA: Diagnosis not present

## 2015-09-12 DIAGNOSIS — L659 Nonscarring hair loss, unspecified: Secondary | ICD-10-CM | POA: Diagnosis not present

## 2015-09-17 ENCOUNTER — Ambulatory Visit: Payer: PPO | Admitting: Physical Therapy

## 2015-09-17 ENCOUNTER — Encounter: Payer: Self-pay | Admitting: Physical Therapy

## 2015-09-17 DIAGNOSIS — M545 Low back pain, unspecified: Secondary | ICD-10-CM

## 2015-09-17 NOTE — Therapy (Signed)
[facility] [address] [place], [place], [address] Phone: [phone]   Fax:  [phone]  Physical Therapy Treatment  Patient Details  Name: [name] MRN: [id number] Date of Birth: 06/25/1950 No Data Recorded  Encounter Date: 09/17/2015      PT End of Session - 09/17/15 1508    Visit Number 6   Date for PT Re-Evaluation 10/07/15   PT Start Time 1430   PT Stop Time 1510   PT Time Calculation (min) 40 min      Past Medical History  Diagnosis Date  . Hypersomnia, unspecified   . Other and unspecified hyperlipidemia   . Anxiety state, unspecified   . Abdominal pain, right lower quadrant   . Abdominal pain, epigastric   . Spasm of muscle   . Unspecified tinnitus   . Asymptomatic varicose veins   . Edema   . Rash and other nonspecific skin eruption   . Herpes zoster without mention of complication   . Blood vessel replaced by other means   . Other cataract   . Lumbago   . Insomnia, unspecified   . Pain in limb   . Optic neuritis   . Phlebitis and thrombophlebitis of other deep vessels of lower extremities   . Obesity, unspecified   . Unspecified essential hypertension   . Dizziness and giddiness   . Abnormality of gait   . Disorder of bone and cartilage, unspecified   . Abnormal weight gain   . Other abnormal blood chemistry   . Abdominal pain, epigastric   . Syncope and collapse   . Palpitations   . Encounter for long-term (current) use of other medications   . Myalgia and myositis, unspecified   . Cramp of limb   . Unspecified hypothyroidism   . Long term (current) use of anticoagulants   . Lumbago   . Muscle weakness (generalized)   . Phlebitis and thrombophlebitis of lower extremities, unspecified   . Tobacco use disorder   . Pain in limb   . Unspecified sinusitis (chronic)   . Pain in joint, site unspecified   . Herpes zoster without mention of complication     Past Surgical  History  Procedure Laterality Date  . Right knee  1971  . Right breast biopsy  1986  . Greenwood filter implant  2008    DR DICKERSON  . Lower back  05/26/2007    DR JEFF BEANE  . Hip surgery left  03/2010    DR CHRIS BLACKMAN  . Hip surgery right  06/2010    DR CHRIS BLACKMAN  . Left shoulder  2012    DR CHRIS BLACKMAN     There were no vitals filed for this visit.      Subjective Assessment - 09/17/15 1428    Subjective "Things are going well, only if I can figure out how to sleep"   Currently in Pain? Yes   Pain Score 3    Pain Location Back   Pain Orientation Lower;Right                         [facility] Adult PT Treatment/Exercise - 09/17/15 0001    Lumbar Exercises: Stretches   Passive Hamstring Stretch 20 seconds;3 reps   Lower Trunk Rotation 3 reps;10 seconds   ITB Stretch 3 reps;20 seconds   Piriformis Stretch 3 reps;20 seconds   Lumbar Exercises: Aerobic   Stationary Bike   NuStep level 3 x 6 minutes   Lumbar Exercises: Supine   Other Supine Lumbar Exercises feet on ball K2C, trunk rotaiton and small bridges, isometric abdominal squeeze   Ultrasound   Ultrasound Location Lumbar R in sidelying   Ultrasound Parameters 1000 1MHz 1.2 w/cm2   Ultrasound Goals Pain   Manual Therapy   Manual therapy comments PROM to LE's                  PT Short Term Goals - 08/20/15 1531    PT SHORT TERM GOAL #1   Title indepednent with initial HEP   Status Achieved           PT Long Term Goals - 09/10/15 1610    PT LONG TERM GOAL #1   Title decrease pain 50%   Status Partially Met   PT LONG TERM GOAL #2   Title increase lumbar ROM 25%   Status Partially Met   PT LONG TERM GOAL #3   Title report that she is able to do the housework without pain >5/10   Status On-going   PT LONG TERM GOAL #4   Title understand proper posture and body mechanics   Status Achieved               Plan - 09/17/15 1509    Clinical Impression Statement  Pt reports that she continues to do well. Does reports that her back popped with small lumbar rotations with LE on physo ball   Rehab Potential Good   PT Frequency 2x / week   PT Duration 8 weeks   PT Treatment/Interventions ADLs/Self Care Home Management;Electrical Stimulation;Moist Heat;Therapeutic exercise;Traction;Neuromuscular re-education;Patient/family education;Therapeutic activities;Manual techniques   PT Next Visit Plan Slowly add exercises, and flexibility, modalities as needed      Patient will benefit from skilled therapeutic intervention in order to improve the following deficits and impairments:  Decreased range of motion, Decreased strength, Increased muscle spasms, Impaired flexibility, Pain  Visit Diagnosis: Bilateral low back pain without sciatica     Problem List Patient Active Problem List   Diagnosis Date Noted  . Acute bronchitis 07/02/2015  . Low back pain 06/04/2015  . Optic neuritis 05/01/2015  . Chronic pain 05/01/2015  . Tobacco abuse 06/04/2014  . Asthma with bronchitis 06/04/2014  . H/O cardiovascular disorder 03/17/2014  . Anterior optic neuritis 03/17/2014  . Seasonal and perennial allergic rhinitis 03/06/2014  . H/O thrombosis 12/24/2013  . Myalgia and myositis 08/23/2013  . Cough 08/23/2013  . Non-toxic uninodular goiter 03/07/2013  . Abnormality of gait 11/22/2012  . Allergy 11/16/2012  . Hypothyroidism 08/31/2012  . Essential hypertension 08/31/2012  . Personal history of fall 08/31/2012  . Pain in joint, lower leg 08/31/2012  . Obstructive sleep apnea 08/31/2012    Scot Jun , PTA   09/17/2015, 3:11 PM  [place], [place], [address] Phone: [phone]   Fax:  [phone]  Name: [name] MRN: [id number] Date of Birth: 06-19-1950

## 2015-09-18 ENCOUNTER — Ambulatory Visit: Payer: PPO | Admitting: Physical Therapy

## 2015-09-25 ENCOUNTER — Ambulatory Visit: Payer: PPO | Admitting: Physical Therapy

## 2015-09-25 ENCOUNTER — Encounter: Payer: Self-pay | Admitting: Physical Therapy

## 2015-09-25 DIAGNOSIS — M545 Low back pain, unspecified: Secondary | ICD-10-CM

## 2015-09-25 NOTE — Therapy (Signed)
[facility], [place], [address] Phone: [phone]   Fax:  [phone]  Physical Therapy Treatment  Patient Details  Name: [name] MRN: [id number] Date of Birth: 1951-01-13 No Data Recorded  Encounter Date: 09/25/2015      PT End of Session - 09/25/15 1433    Visit Number 7   Date for PT Re-Evaluation 10/07/15   PT Start Time 1400  patient reported that she had to leave early   PT Stop Time 1433   PT Time Calculation (min) 33 min   Activity Tolerance Patient tolerated treatment well   Behavior During Therapy [facility] for tasks assessed/performed      Past Medical History  Diagnosis Date  . Hypersomnia, unspecified   . Other and unspecified hyperlipidemia   . Anxiety state, unspecified   . Abdominal pain, right lower quadrant   . Abdominal pain, epigastric   . Spasm of muscle   . Unspecified tinnitus   . Asymptomatic varicose veins   . Edema   . Rash and other nonspecific skin eruption   . Herpes zoster without mention of complication   . Blood vessel replaced by other means   . Other cataract   . Lumbago   . Insomnia, unspecified   . Pain in limb   . Optic neuritis   . Phlebitis and thrombophlebitis of other deep vessels of lower extremities   . Obesity, unspecified   . Unspecified essential hypertension   . Dizziness and giddiness   . Abnormality of gait   . Disorder of bone and cartilage, unspecified   . Abnormal weight gain   . Other abnormal blood chemistry   . Abdominal pain, epigastric   . Syncope and collapse   . Palpitations   . Encounter for long-term (current) use of other medications   . Myalgia and myositis, unspecified   . Cramp of limb   . Unspecified hypothyroidism   . Long term (current) use of anticoagulants   . Lumbago   . Muscle weakness (generalized)   . Phlebitis and thrombophlebitis of lower extremities, unspecified   . Tobacco use disorder   .  Pain in limb   . Unspecified sinusitis (chronic)   . Pain in joint, site unspecified   . Herpes zoster without mention of complication     Past Surgical History  Procedure Laterality Date  . Right knee  1971  . Right breast biopsy  1986  . Greenwood filter implant  2008    DR Bobette Mo  . Lower back  05/26/2007    DR JEFF BEANE  . Hip surgery left  03/2010    DR [facility]  . Hip surgery right  06/2010    DR [facility]  . Left shoulder  2012    DR CHRIS BLACKMAN     There were no vitals filed for this visit.      Subjective Assessment - 09/25/15 1429    Subjective I really hurt after washing the dogs this weekend.  I could not walk straight because I was bent over because of back pain   Currently in Pain? Yes   Pain Score 4    Pain Location Back   Pain Orientation Right;Lower   Pain Descriptors / Indicators Aching;Spasm   Aggravating Factors  bending over   Pain Relieving Factors massage  [facility] Adult PT Treatment/Exercise - 09/25/15 0001    Lumbar Exercises: Stretches   Passive Hamstring Stretch 20 seconds;3 reps   Lower Trunk Rotation 3 reps;10 seconds   ITB Stretch 3 reps;20 seconds   Piriformis Stretch 3 reps;20 seconds   Lumbar Exercises: Aerobic   Stationary Bike NuStep level 3 x 6 minutes   Lumbar Exercises: Supine   Other Supine Lumbar Exercises feet on ball K2C, trunk rotaiton and small bridges, isometric abdominal squeeze   Ultrasound   Ultrasound Location right lumbar area   Ultrasound Parameters 100% 1MHz   Ultrasound Goals Pain   Manual Therapy   Manual therapy comments STM to the right low back                  PT Short Term Goals - 08/20/15 1531    PT SHORT TERM GOAL #1   Title indepednent with initial HEP   Status Achieved           PT Long Term Goals - 09/25/15 1434    PT LONG TERM GOAL #1   Title decrease pain 50%   Status On-going   PT LONG TERM GOAL #2   Title increase  lumbar ROM 25%   Status Achieved               Plan - 09/25/15 1433    Clinical Impression Statement Patient had increase of back pain from washing the dogs this weekend, reports that she has been unable to do her exercises.  She has a large knot in the right SI area   PT Next Visit Plan Slowly add exercises, and flexibility, modalities as needed   Consulted and Agree with Plan of Care Patient      Patient will benefit from skilled therapeutic intervention in order to improve the following deficits and impairments:  Decreased range of motion, Decreased strength, Increased muscle spasms, Impaired flexibility, Pain  Visit Diagnosis: Bilateral low back pain without sciatica     Problem List Patient Active Problem List   Diagnosis Date Noted  . Acute bronchitis 07/02/2015  . Low back pain 06/04/2015  . Optic neuritis 05/01/2015  . Chronic pain 05/01/2015  . Tobacco abuse 06/04/2014  . Asthma with bronchitis 06/04/2014  . H/O cardiovascular disorder 03/17/2014  . Anterior optic neuritis 03/17/2014  . Seasonal and perennial allergic rhinitis 03/06/2014  . H/O thrombosis 12/24/2013  . Myalgia and myositis 08/23/2013  . Cough 08/23/2013  . Non-toxic uninodular goiter 03/07/2013  . Abnormality of gait 11/22/2012  . Allergy 11/16/2012  . Hypothyroidism 08/31/2012  . Essential hypertension 08/31/2012  . Personal history of fall 08/31/2012  . Pain in joint, lower leg 08/31/2012  . Obstructive sleep apnea 08/31/2012    Sumner Boast., PT 09/25/2015, 2:35 PM  [address] [place], [place], [address] Phone: [phone]   Fax:  [phone]  Name: [name] MRN: [id number] Date of Birth: May 16, 1950

## 2015-10-02 ENCOUNTER — Ambulatory Visit: Payer: PPO | Admitting: Physical Therapy

## 2015-10-03 ENCOUNTER — Encounter: Payer: Self-pay | Admitting: Physical Therapy

## 2015-10-03 ENCOUNTER — Ambulatory Visit: Payer: PPO | Attending: Neurosurgery | Admitting: Physical Therapy

## 2015-10-03 DIAGNOSIS — M545 Low back pain, unspecified: Secondary | ICD-10-CM

## 2015-10-03 NOTE — Therapy (Signed)
[place], [place], [address] Phone: [phone]   Fax:  [phone]  Physical Therapy Treatment  Patient Details  Name: [name] MRN: [id number] Date of Birth: 1951/04/22 No Data Recorded  Encounter Date: 10/03/2015      PT End of Session - 10/03/15 1609    Visit Number 8   Date for PT Re-Evaluation 10/07/15   PT Start Time 1443   PT Stop Time 1530   PT Time Calculation (min) 47 min   Activity Tolerance Patient tolerated treatment well   Behavior During Therapy [facility] for tasks assessed/performed      Past Medical History  Diagnosis Date  . Hypersomnia, unspecified   . Other and unspecified hyperlipidemia   . Anxiety state, unspecified   . Abdominal pain, right lower quadrant   . Abdominal pain, epigastric   . Spasm of muscle   . Unspecified tinnitus   . Asymptomatic varicose veins   . Edema   . Rash and other nonspecific skin eruption   . Herpes zoster without mention of complication   . Blood vessel replaced by other means   . Other cataract   . Lumbago   . Insomnia, unspecified   . Pain in limb   . Optic neuritis   . Phlebitis and thrombophlebitis of other deep vessels of lower extremities   . Obesity, unspecified   . Unspecified essential hypertension   . Dizziness and giddiness   . Abnormality of gait   . Disorder of bone and cartilage, unspecified   . Abnormal weight gain   . Other abnormal blood chemistry   . Abdominal pain, epigastric   . Syncope and collapse   . Palpitations   . Encounter for long-term (current) use of other medications   . Myalgia and myositis, unspecified   . Cramp of limb   . Unspecified hypothyroidism   . Long term (current) use of anticoagulants   . Lumbago   . Muscle weakness (generalized)   . Phlebitis and thrombophlebitis of lower extremities, unspecified   . Tobacco use disorder   . Pain in limb   . Unspecified sinusitis (chronic)    . Pain in joint, site unspecified   . Herpes zoster without mention of complication     Past Surgical History  Procedure Laterality Date  . Right knee  1971  . Right breast biopsy  1986  . Greenwood filter implant  2008    DR Bobette Mo  . Lower back  05/26/2007    DR JEFF BEANE  . Hip surgery left  03/2010    DR [facility]  . Hip surgery right  06/2010    DR [facility]  . Left shoulder  2012    DR CHRIS BLACKMAN     There were no vitals filed for this visit.      Subjective Assessment - 10/03/15 1604    Subjective Patient reports that she has been doing better, still some difficulty with walking for any distance, but reports moving a little better   Currently in Pain? Yes   Pain Score 3    Pain Location Back   Pain Orientation Lower;Right                         [facility] Adult PT Treatment/Exercise - 10/03/15 0001    Ambulation/Gait   Gait Comments gait around building (outside) brisk pace, needed assist to negotiate curb  Lumbar Exercises: Stretches   Passive Hamstring Stretch 20 seconds;3 reps   ITB Stretch 3 reps;20 seconds   Piriformis Stretch 3 reps;20 seconds   Lumbar Exercises: Aerobic   Stationary Bike NuStep level 3 x 6 minutes   Lumbar Exercises: Supine   Other Supine Lumbar Exercises feet on ball K2C, trunk rotaiton and small bridges, isometric abdominal squeeze   Ultrasound   Ultrasound Location right lumbar   Ultrasound Parameters 100% 1MHz 1.5w/cm2   Ultrasound Goals Pain   Manual Therapy   Manual therapy comments STM to the right low back                  PT Short Term Goals - 08/20/15 1531    PT SHORT TERM GOAL #1   Title indepednent with initial HEP   Status Achieved           PT Long Term Goals - 10/03/15 1611    PT LONG TERM GOAL #1   Title decrease pain 50%   Status Partially Met   PT LONG TERM GOAL #2   Title increase lumbar ROM 25%   Status Achieved               Plan - 10/03/15  1610    Clinical Impression Statement Patient did well with the walk today, did not slow down, did not c/o increase of pain, she does have a knot in the right SI area, she is becoming more flexible   PT Next Visit Plan Slowly add exercises, and flexibility   Consulted and Agree with Plan of Care Patient      Patient will benefit from skilled therapeutic intervention in order to improve the following deficits and impairments:  Decreased range of motion, Decreased strength, Increased muscle spasms, Impaired flexibility, Pain  Visit Diagnosis: Bilateral low back pain without sciatica     Problem List Patient Active Problem List   Diagnosis Date Noted  . Acute bronchitis 07/02/2015  . Low back pain 06/04/2015  . Optic neuritis 05/01/2015  . Chronic pain 05/01/2015  . Tobacco abuse 06/04/2014  . Asthma with bronchitis 06/04/2014  . H/O cardiovascular disorder 03/17/2014  . Anterior optic neuritis 03/17/2014  . Seasonal and perennial allergic rhinitis 03/06/2014  . H/O thrombosis 12/24/2013  . Myalgia and myositis 08/23/2013  . Cough 08/23/2013  . Non-toxic uninodular goiter 03/07/2013  . Abnormality of gait 11/22/2012  . Allergy 11/16/2012  . Hypothyroidism 08/31/2012  . Essential hypertension 08/31/2012  . Personal history of fall 08/31/2012  . Pain in joint, lower leg 08/31/2012  . Obstructive sleep apnea 08/31/2012    Sumner Boast., PT 10/03/2015, 4:12 PM  [address] [place], [place], [address] Phone: [phone]   Fax:  [phone]  Name: [name] MRN: [id number] Date of Birth: [birth year]

## 2015-10-15 ENCOUNTER — Encounter: Payer: Self-pay | Admitting: Physical Therapy

## 2015-10-15 ENCOUNTER — Ambulatory Visit: Payer: PPO | Admitting: Physical Therapy

## 2015-10-15 DIAGNOSIS — M545 Low back pain, unspecified: Secondary | ICD-10-CM

## 2015-10-15 NOTE — Therapy (Signed)
[place], [place], [address] Phone: [phone]   Fax:  [phone]  Physical Therapy Treatment  Patient Details  Name: [name] MRN: [id number] Date of Birth: 1950-10-23 No Data Recorded  Encounter Date: 10/15/2015      PT End of Session - 10/15/15 1541    Visit Number 9   Date for PT Re-Evaluation 11/07/15   PT Start Time 1530   PT Stop Time 1615   PT Time Calculation (min) 45 min   Activity Tolerance Patient tolerated treatment well   Behavior During Therapy [facility] for tasks assessed/performed      Past Medical History  Diagnosis Date  . Hypersomnia, unspecified   . Other and unspecified hyperlipidemia   . Anxiety state, unspecified   . Abdominal pain, right lower quadrant   . Abdominal pain, epigastric   . Spasm of muscle   . Unspecified tinnitus   . Asymptomatic varicose veins   . Edema   . Rash and other nonspecific skin eruption   . Herpes zoster without mention of complication   . Blood vessel replaced by other means   . Other cataract   . Lumbago   . Insomnia, unspecified   . Pain in limb   . Optic neuritis   . Phlebitis and thrombophlebitis of other deep vessels of lower extremities   . Obesity, unspecified   . Unspecified essential hypertension   . Dizziness and giddiness   . Abnormality of gait   . Disorder of bone and cartilage, unspecified   . Abnormal weight gain   . Other abnormal blood chemistry   . Abdominal pain, epigastric   . Syncope and collapse   . Palpitations   . Encounter for long-term (current) use of other medications   . Myalgia and myositis, unspecified   . Cramp of limb   . Unspecified hypothyroidism   . Long term (current) use of anticoagulants   . Lumbago   . Muscle weakness (generalized)   . Phlebitis and thrombophlebitis of lower extremities, unspecified   . Tobacco use disorder   . Pain in limb   . Unspecified sinusitis (chronic)    . Pain in joint, site unspecified   . Herpes zoster without mention of complication     Past Surgical History  Procedure Laterality Date  . Right knee  1971  . Right breast biopsy  1986  . Greenwood filter implant  2008    DR Bobette Mo  . Lower back  05/26/2007    DR JEFF BEANE  . Hip surgery left  03/2010    DR [facility]  . Hip surgery right  06/2010    DR [facility]  . Left shoulder  2012    DR CHRIS BLACKMAN     There were no vitals filed for this visit.      Subjective Assessment - 10/15/15 1528    Subjective Still doing better and trying to do more at home   Currently in Pain? Yes   Pain Score 2    Pain Location Back   Pain Orientation Right;Lower   Pain Descriptors / Indicators Sore   Aggravating Factors  standing and bending   Pain Relieving Factors being more active                         [facility] Adult PT Treatment/Exercise - 10/15/15 0001    Lumbar Exercises: Stretches  Passive Hamstring Stretch 30 seconds;4 reps   ITB Stretch 4 reps;20 seconds   Piriformis Stretch 4 reps;20 seconds   Lumbar Exercises: Aerobic   Stationary Bike NuStep level 3 x 6 minutes   Lumbar Exercises: Supine   Other Supine Lumbar Exercises feet on ball K2C, trunk rotaiton and small bridges, isometric abdominal squeeze   Ultrasound   Ultrasound Location right lumbar area   Ultrasound Parameters 100% 1.5w/cm2   Ultrasound Goals Pain   Manual Therapy   Manual therapy comments STM to the right low back                  PT Short Term Goals - 08/20/15 1531    PT SHORT TERM GOAL #1   Title indepednent with initial HEP   Status Achieved           PT Long Term Goals - 10/15/15 1610    PT LONG TERM GOAL #1   Title decrease pain 50%   Status Partially Met   PT LONG TERM GOAL #2   Title increase lumbar ROM 25%   Status Achieved   PT LONG TERM GOAL #3   Title report that she is able to do the housework without pain >5/10   Status Achieved    PT LONG TERM GOAL #4   Title understand proper posture and body mechanics   Status Achieved               Plan - 10/15/15 1607    Clinical Impression Statement Reporting some better with her being more active, we discussed that there is a fine line b/n too much and too little, her ROM was very good today   PT Next Visit Plan add exercises next visit   Consulted and Agree with Plan of Care Patient      Patient will benefit from skilled therapeutic intervention in order to improve the following deficits and impairments:  Decreased range of motion, Decreased strength, Increased muscle spasms, Impaired flexibility, Pain  Visit Diagnosis: Bilateral low back pain without sciatica     Problem List Patient Active Problem List   Diagnosis Date Noted  . Acute bronchitis 07/02/2015  . Low back pain 06/04/2015  . Optic neuritis 05/01/2015  . Chronic pain 05/01/2015  . Tobacco abuse 06/04/2014  . Asthma with bronchitis 06/04/2014  . H/O cardiovascular disorder 03/17/2014  . Anterior optic neuritis 03/17/2014  . Seasonal and perennial allergic rhinitis 03/06/2014  . H/O thrombosis 12/24/2013  . Myalgia and myositis 08/23/2013  . Cough 08/23/2013  . Non-toxic uninodular goiter 03/07/2013  . Abnormality of gait 11/22/2012  . Allergy 11/16/2012  . Hypothyroidism 08/31/2012  . Essential hypertension 08/31/2012  . Personal history of fall 08/31/2012  . Pain in joint, lower leg 08/31/2012  . Obstructive sleep apnea 08/31/2012    Sumner Boast., PT 10/15/2015, 4:12 PM  [address] [place], [place], [address] Phone: [phone]   Fax:  [phone]  Name: MAXENE BYINGTON MRN: [id number] Date of Birth: 12/07/1950

## 2015-10-22 ENCOUNTER — Encounter: Payer: Self-pay | Admitting: Physical Therapy

## 2015-10-22 ENCOUNTER — Ambulatory Visit: Payer: PPO | Admitting: Physical Therapy

## 2015-10-22 DIAGNOSIS — M545 Low back pain, unspecified: Secondary | ICD-10-CM

## 2015-10-22 NOTE — Therapy (Signed)
[place], [place], [address] Phone: [phone]   Fax:  [phone]  Physical Therapy Treatment  Patient Details  Name: [name] MRN: [id number] Date of Birth: 04/10/1951 No Data Recorded  Encounter Date: 10/22/2015      PT End of Session - 10/22/15 1715    Visit Number 10   Date for PT Re-Evaluation 11/07/15   PT Start Time 1640   PT Stop Time 1717   PT Time Calculation (min) 37 min   Activity Tolerance Patient tolerated treatment well   Behavior During Therapy [facility] for tasks assessed/performed      Past Medical History  Diagnosis Date  . Hypersomnia, unspecified   . Other and unspecified hyperlipidemia   . Anxiety state, unspecified   . Abdominal pain, right lower quadrant   . Abdominal pain, epigastric   . Spasm of muscle   . Unspecified tinnitus   . Asymptomatic varicose veins   . Edema   . Rash and other nonspecific skin eruption   . Herpes zoster without mention of complication   . Blood vessel replaced by other means   . Other cataract   . Lumbago   . Insomnia, unspecified   . Pain in limb   . Optic neuritis   . Phlebitis and thrombophlebitis of other deep vessels of lower extremities   . Obesity, unspecified   . Unspecified essential hypertension   . Dizziness and giddiness   . Abnormality of gait   . Disorder of bone and cartilage, unspecified   . Abnormal weight gain   . Other abnormal blood chemistry   . Abdominal pain, epigastric   . Syncope and collapse   . Palpitations   . Encounter for long-term (current) use of other medications   . Myalgia and myositis, unspecified   . Cramp of limb   . Unspecified hypothyroidism   . Long term (current) use of anticoagulants   . Lumbago   . Muscle weakness (generalized)   . Phlebitis and thrombophlebitis of lower extremities, unspecified   . Tobacco use disorder   . Pain in limb   . Unspecified sinusitis  (chronic)   . Pain in joint, site unspecified   . Herpes zoster without mention of complication     Past Surgical History  Procedure Laterality Date  . Right knee  1971  . Right breast biopsy  1986  . Greenwood filter implant  2008    DR Bobette Mo  . Lower back  05/26/2007    DR JEFF BEANE  . Hip surgery left  03/2010    DR [facility]  . Hip surgery right  06/2010    DR [facility]  . Left shoulder  2012    DR CHRIS BLACKMAN     There were no vitals filed for this visit.      Subjective Assessment - 10/22/15 1713    Subjective I really felt good for most of the week after last time.   Currently in Pain? Yes   Pain Score 3    Pain Location Back   Pain Orientation Right;Lower   Pain Descriptors / Indicators Spasm   Aggravating Factors  "stress may make it worse"                         [facility] Adult PT Treatment/Exercise - 10/22/15 0001    Manual Therapy   Manual therapy comments STM to  the lumbar and thoracic area, PROM of the hips and low back                  PT Short Term Goals - 08/20/15 1531    PT SHORT TERM GOAL #1   Title indepednent with initial HEP   Status Achieved           PT Long Term Goals - 11/18/15 1718    PT LONG TERM GOAL #1   Title decrease pain 50%   Status Partially Met   PT LONG TERM GOAL #2   Title increase lumbar ROM 25%   Status Achieved   PT LONG TERM GOAL #3   Title report that she is able to do the housework without pain >5/10   Status Achieved   PT LONG TERM GOAL #4   Title understand proper posture and body mechanics   Status Achieved               Plan - 18-Nov-2015 1717    Clinical Impression Statement Had some increased spasms and tightness in the mms today, she reported increased stress.  Some knots in the right upper trap and rhomboid that were very tender and referred pain into the low back area   PT Next Visit Plan see if STM helped   Consulted and Agree with Plan of Care  Patient      Patient will benefit from skilled therapeutic intervention in order to improve the following deficits and impairments:  Decreased range of motion, Decreased strength, Increased muscle spasms, Impaired flexibility, Pain  Visit Diagnosis: Bilateral low back pain without sciatica       G-Codes - 18-Nov-2015 1718    Functional Assessment Tool Used foto 43% limitation   Functional Limitation Other PT primary   Other PT Primary Current Status (O3291) At least 40 percent but less than 60 percent impaired, limited or restricted   Other PT Primary Goal Status (B1660) At least 40 percent but less than 60 percent impaired, limited or restricted      Problem List Patient Active Problem List   Diagnosis Date Noted  . Acute bronchitis 07/02/2015  . Low back pain 06/04/2015  . Optic neuritis 05/01/2015  . Chronic pain 05/01/2015  . Tobacco abuse 06/04/2014  . Asthma with bronchitis 06/04/2014  . H/O cardiovascular disorder 03/17/2014  . Anterior optic neuritis 03/17/2014  . Seasonal and perennial allergic rhinitis 03/06/2014  . H/O thrombosis 12/24/2013  . Myalgia and myositis 08/23/2013  . Cough 08/23/2013  . Non-toxic uninodular goiter 03/07/2013  . Abnormality of gait 11/22/2012  . Allergy 11/16/2012  . Hypothyroidism 08/31/2012  . Essential hypertension 08/31/2012  . Personal history of fall 08/31/2012  . Pain in joint, lower leg 08/31/2012  . Obstructive sleep apnea 08/31/2012    Sumner Boast., PT 2015/11/18, 5:19 PM  [address] [place], [place], [address] Phone: [phone]   Fax:  [phone]  Name: [name] MRN: [id number] Date of Birth: 23-May-1950

## 2015-11-04 ENCOUNTER — Encounter: Payer: Self-pay | Admitting: Physical Therapy

## 2015-11-04 ENCOUNTER — Ambulatory Visit: Payer: PPO | Attending: Neurosurgery | Admitting: Physical Therapy

## 2015-11-04 DIAGNOSIS — M545 Low back pain, unspecified: Secondary | ICD-10-CM

## 2015-11-04 NOTE — Therapy (Addendum)
[place], [place], [address] Phone: [phone]   Fax:  [phone]  Physical Therapy Treatment  Patient Details  Name: [name] MRN: [id number] Date of Birth: Sep 17, 1950 No Data Recorded  Encounter Date: 11/04/2015      PT End of Session - 11/04/15 1741    Visit Number 11   Date for PT Re-Evaluation 12/05/15   PT Start Time 1700   PT Stop Time 1740   PT Time Calculation (min) 40 min   Activity Tolerance Patient tolerated treatment well   Behavior During Therapy [facility] for tasks assessed/performed      Past Medical History  Diagnosis Date  . Hypersomnia, unspecified   . Other and unspecified hyperlipidemia   . Anxiety state, unspecified   . Abdominal pain, right lower quadrant   . Abdominal pain, epigastric   . Spasm of muscle   . Unspecified tinnitus   . Asymptomatic varicose veins   . Edema   . Rash and other nonspecific skin eruption   . Herpes zoster without mention of complication   . Blood vessel replaced by other means   . Other cataract   . Lumbago   . Insomnia, unspecified   . Pain in limb   . Optic neuritis   . Phlebitis and thrombophlebitis of other deep vessels of lower extremities   . Obesity, unspecified   . Unspecified essential hypertension   . Dizziness and giddiness   . Abnormality of gait   . Disorder of bone and cartilage, unspecified   . Abnormal weight gain   . Other abnormal blood chemistry   . Abdominal pain, epigastric   . Syncope and collapse   . Palpitations   . Encounter for long-term (current) use of other medications   . Myalgia and myositis, unspecified   . Cramp of limb   . Unspecified hypothyroidism   . Long term (current) use of anticoagulants   . Lumbago   . Muscle weakness (generalized)   . Phlebitis and thrombophlebitis of lower extremities, unspecified   . Tobacco use disorder   . Pain in limb   . Unspecified sinusitis  (chronic)   . Pain in joint, site unspecified   . Herpes zoster without mention of complication     Past Surgical History  Procedure Laterality Date  . Right knee  1971  . Right breast biopsy  1986  . Greenwood filter implant  2008    DR Bobette Mo  . Lower back  05/26/2007    DR JEFF BEANE  . Hip surgery left  03/2010    DR [facility]  . Hip surgery right  06/2010    DR [facility]  . Left shoulder  2012    DR CHRIS BLACKMAN     There were no vitals filed for this visit.      Subjective Assessment - 11/04/15 1738    Subjective I really had a great week after the last treatment, had some increased activity of housework that caused some increased pain yesterday and today   Currently in Pain? Yes   Pain Score 2    Pain Location Back   Pain Orientation Right;Lower   Aggravating Factors  stress, housework   Pain Relieving Factors the treatment helps a lot                         [facility] Adult PT Treatment/Exercise - 11/04/15 0001  Lumbar Exercises: Stretches   Passive Hamstring Stretch 30 seconds;4 reps   Piriformis Stretch 4 reps;20 seconds   Lumbar Exercises: Aerobic   Stationary Bike NuStep level 3 x 6 minutes   Lumbar Exercises: Machines for Strengthening   Other Lumbar Machine Exercise shoulder shrugs with upper traps and levator stretches   Lumbar Exercises: Standing   Scapular Retraction 20 reps;Theraband   Theraband Level (Scapular Retraction) Level 2 (Red)   Row 20 reps;Theraband   Theraband Level (Row) Level 2 (Red)   Lumbar Exercises: Supine   Other Supine Lumbar Exercises feet on ball K2C, trunk rotaiton and small bridges, isometric abdominal squeeze   Manual Therapy   Manual therapy comments STM to the lumbar and thoracic area, PROM of the hips and low back                  PT Short Term Goals - 08/20/15 1531    PT SHORT TERM GOAL #1   Title indepednent with initial HEP   Status Achieved           PT Long Term  Goals - 11/04/15 1743    PT LONG TERM GOAL #1   Title decrease pain 50%   Status Partially Met   PT LONG TERM GOAL #2   Title increase lumbar ROM 25%   Status Achieved   PT LONG TERM GOAL #3   Title report that she is able to do the housework without pain >5/10   Status Achieved   PT LONG TERM GOAL #4   Title understand proper posture and body mechanics   Status Achieved               Plan - 11/04/15 1742    Clinical Impression Statement still with knot in the right upper trap and tenderness here and in the right lumbar area.  ROM is WFL's.  Needed cues with form for exercises   PT Next Visit Plan continue with the STM and add exercises as she tolerates   Consulted and Agree with Plan of Care Patient      Patient will benefit from skilled therapeutic intervention in order to improve the following deficits and impairments:  Decreased range of motion, Decreased strength, Increased muscle spasms, Impaired flexibility, Pain  Visit Diagnosis: Bilateral low back pain without sciatica - Plan: PT plan of care cert/re-cert     Problem List Patient Active Problem List   Diagnosis Date Noted  . Acute bronchitis 07/02/2015  . Low back pain 06/04/2015  . Optic neuritis 05/01/2015  . Chronic pain 05/01/2015  . Tobacco abuse 06/04/2014  . Asthma with bronchitis 06/04/2014  . H/O cardiovascular disorder 03/17/2014  . Anterior optic neuritis 03/17/2014  . Seasonal and perennial allergic rhinitis 03/06/2014  . H/O thrombosis 12/24/2013  . Myalgia and myositis 08/23/2013  . Cough 08/23/2013  . Non-toxic uninodular goiter 03/07/2013  . Abnormality of gait 11/22/2012  . Allergy 11/16/2012  . Hypothyroidism 08/31/2012  . Essential hypertension 08/31/2012  . Personal history of fall 08/31/2012  . Pain in joint, lower leg 08/31/2012  . Obstructive sleep apnea 08/31/2012   PHYSICAL THERAPY DISCHARGE SUMMARY   Plan: Patient agrees to discharge.  Patient goals were met. Patient  is being discharged due to not returning since the last visit.  ?????        Sumner Boast., PT 11/04/2015, 5:45 PM  [place], [place], [address] Phone: [phone]  Fax:  [phone]  Name: KIRSTEIN BAXLEY MRN: [id number] Date of Birth: 24-Apr-1951

## 2015-11-06 DIAGNOSIS — Z6829 Body mass index (BMI) 29.0-29.9, adult: Secondary | ICD-10-CM | POA: Diagnosis not present

## 2015-11-12 ENCOUNTER — Ambulatory Visit: Payer: PPO | Admitting: Physical Therapy

## 2015-12-03 DIAGNOSIS — L900 Lichen sclerosus et atrophicus: Secondary | ICD-10-CM | POA: Diagnosis not present

## 2015-12-09 DIAGNOSIS — Z961 Presence of intraocular lens: Secondary | ICD-10-CM | POA: Diagnosis not present

## 2015-12-09 DIAGNOSIS — H26492 Other secondary cataract, left eye: Secondary | ICD-10-CM | POA: Diagnosis not present

## 2015-12-09 DIAGNOSIS — H4720 Unspecified optic atrophy: Secondary | ICD-10-CM | POA: Diagnosis not present

## 2015-12-09 DIAGNOSIS — H04123 Dry eye syndrome of bilateral lacrimal glands: Secondary | ICD-10-CM | POA: Diagnosis not present

## 2015-12-24 ENCOUNTER — Encounter: Payer: Self-pay | Admitting: Internal Medicine

## 2015-12-31 ENCOUNTER — Encounter: Payer: Self-pay | Admitting: Internal Medicine

## 2015-12-31 ENCOUNTER — Ambulatory Visit (INDEPENDENT_AMBULATORY_CARE_PROVIDER_SITE_OTHER): Payer: PPO | Admitting: Internal Medicine

## 2015-12-31 VITALS — BP 132/82 | HR 65 | Temp 97.6°F | Ht 68.0 in | Wt 190.0 lb

## 2015-12-31 DIAGNOSIS — E039 Hypothyroidism, unspecified: Secondary | ICD-10-CM | POA: Diagnosis not present

## 2015-12-31 DIAGNOSIS — G8929 Other chronic pain: Secondary | ICD-10-CM | POA: Diagnosis not present

## 2015-12-31 DIAGNOSIS — I10 Essential (primary) hypertension: Secondary | ICD-10-CM | POA: Diagnosis not present

## 2015-12-31 DIAGNOSIS — R05 Cough: Secondary | ICD-10-CM

## 2015-12-31 DIAGNOSIS — Z720 Tobacco use: Secondary | ICD-10-CM

## 2015-12-31 DIAGNOSIS — Z23 Encounter for immunization: Secondary | ICD-10-CM

## 2015-12-31 DIAGNOSIS — R059 Cough, unspecified: Secondary | ICD-10-CM

## 2015-12-31 MED ORDER — VARENICLINE TARTRATE 0.5 MG X 11 & 1 MG X 42 PO MISC: ORAL | 0 refills | Status: DC

## 2015-12-31 MED ORDER — VARENICLINE TARTRATE 1 MG PO TABS: 1.0000 mg | ORAL_TABLET | Freq: Two times a day (BID) | ORAL | 5 refills | Status: DC

## 2015-12-31 MED ORDER — VARENICLINE TARTRATE 1 MG PO TABS: ORAL_TABLET | ORAL | 6 refills | Status: DC
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
## 2016-01-08 DIAGNOSIS — I10 Essential (primary) hypertension: Secondary | ICD-10-CM | POA: Diagnosis not present

## 2016-01-08 DIAGNOSIS — M5412 Radiculopathy, cervical region: Secondary | ICD-10-CM | POA: Diagnosis not present

## 2016-01-08 DIAGNOSIS — Z6830 Body mass index (BMI) 30.0-30.9, adult: Secondary | ICD-10-CM | POA: Diagnosis not present

## 2016-02-05 DIAGNOSIS — L900 Lichen sclerosus et atrophicus: Secondary | ICD-10-CM | POA: Diagnosis not present

## 2016-02-05 DIAGNOSIS — N9089 Other specified noninflammatory disorders of vulva and perineum: Secondary | ICD-10-CM | POA: Diagnosis not present

## 2016-02-05 DIAGNOSIS — Z6829 Body mass index (BMI) 29.0-29.9, adult: Secondary | ICD-10-CM | POA: Diagnosis not present

## 2016-02-20 DIAGNOSIS — N9089 Other specified noninflammatory disorders of vulva and perineum: Secondary | ICD-10-CM | POA: Diagnosis not present

## 2016-02-26 ENCOUNTER — Ambulatory Visit (INDEPENDENT_AMBULATORY_CARE_PROVIDER_SITE_OTHER): Payer: PPO

## 2016-02-26 VITALS — BP 142/82 | HR 82 | Temp 98.0°F | Ht 68.0 in | Wt 192.2 lb

## 2016-02-26 DIAGNOSIS — Z0000 Encounter for general adult medical examination without abnormal findings: Secondary | ICD-10-CM

## 2016-02-26 NOTE — Progress Notes (Signed)
Subjective:   [name] is a 65 y.o. female who presents for an Initial Medicare Annual Wellness Visit.  Review of Systems     Cardiac Risk Factors include: advanced age (>57men, >6 women);smoking/ tobacco exposure     Objective:    Today's Vitals   02/26/16 1312  BP: (!) 142/82  Pulse: 82  Temp: 98 F (36.7 C)  TempSrc: Oral  SpO2: 96%  Weight: 192 lb 3.2 oz (87.2 kg)  Height: 5\' 8"  (1.727 m)  PainSc: 6    Body mass index is 29.22 kg/m.   Current Medications (verified) Outpatient Encounter Prescriptions as of 02/26/2016  Medication Sig  . estradiol (ESTRACE) 0.1 MG/GM vaginal cream Place 42.5 g vaginally at bedtime.  Marland Kitchen acetaminophen (TYLENOL) 650 MG CR tablet Take 650 mg by mouth every 8 (eight) hours as needed for pain (Takes 1-2 x weekly). Take one to two tablets every 8 hours as needed  . aspirin 325 MG tablet Take 325 mg by mouth daily.   . Biotin 5000 MCG TABS Take by mouth daily.  . Cholecalciferol (VITAMIN D-3 PO) Take 1 tablet by mouth daily.  Marland Kitchen EPINEPHrine 0.3 mg/0.3 mL IJ SOAJ injection Inject 0.3 mLs (0.3 mg total) into the muscle as needed.  Vladimir Faster Glycol-Propyl Glycol (SYSTANE PRESERVATIVE FREE OP) Apply to eye.  Marland Kitchen TIROSINT 75 MCG CAPS TK 1 C PO QD  . varenicline (CHANTIX CONTINUING MONTH PAK) 1 MG tablet One twice daily to help stop smoking  . varenicline (CHANTIX STARTING MONTH PAK) 0.5 MG X 11 & 1 MG X 42 tablet Take one 0.5 mg tablet by mouth once daily for 3 days, then increase to one 0.5 mg tablet twice daily for 4 days, then increase to one 1 mg tablet twice daily.  . [DISCONTINUED] clobetasol ointment (TEMOVATE) [id number] % Apply 1 application topically. Apply once daily for 30 days  . [DISCONTINUED] loratadine (CLARITIN) 10 MG tablet Take 10 mg by mouth as needed.    No facility-administered encounter medications on file as of 02/26/2016.     Allergies (verified) Cortizone-10 [hydrocortisone]; Synthroid [levothyroxine sodium];  Azithromycin; Betadine [povidone iodine]; Calcium channel blockers; Codeine; Fluoride preparations; Iodine; Levaquin [levofloxacin in d5w]; Lyrica [pregabalin]; Novocain [procaine]; Other; Peanuts [peanut oil]; Penicillins; Pollen extract; Prozac [fluoxetine hcl]; Shellfish allergy; Wellbutrin [bupropion]; and Lanolin   History: Past Medical History:  Diagnosis Date  . Abdominal pain, epigastric   . Abdominal pain, epigastric   . Abdominal pain, right lower quadrant   . Abnormal weight gain   . Abnormality of gait   . Anxiety state, unspecified   . Asymptomatic varicose veins   . Blood vessel replaced by other means   . Cramp of limb   . Disorder of bone and cartilage, unspecified   . Dizziness and giddiness   . Edema   . Encounter for long-term (current) use of other medications   . Herpes zoster without mention of complication   . Herpes zoster without mention of complication   . Hypersomnia, unspecified   . Insomnia, unspecified   . Long term (current) use of anticoagulants   . Lumbago   . Lumbago   . Muscle weakness (generalized)   . Myalgia and myositis, unspecified   . Obesity, unspecified   . Optic neuritis   . Other abnormal blood chemistry   . Other and unspecified hyperlipidemia   . Other cataract   . Pain in joint, site unspecified   . Pain in limb   .  Pain in limb   . Palpitations   . Phlebitis and thrombophlebitis of lower extremities, unspecified   . Phlebitis and thrombophlebitis of other deep vessels of lower extremities   . Rash and other nonspecific skin eruption   . Spasm of muscle   . Syncope and collapse   . Tobacco use disorder   . Unspecified essential hypertension   . Unspecified hypothyroidism   . Unspecified sinusitis (chronic)   . Unspecified tinnitus    Past Surgical History:  Procedure Laterality Date  . [place] IMPLANT  2008   DR DICKERSON  . HIP SURGERY LEFT  03/2010   DR [facility]  . HIP SURGERY RIGHT  06/2010   DR  [facility]  . LEFT SHOULDER  2012   DR [facility]   . LOWER BACK  05/26/2007   DR JEFF BEANE  . RIGHT BREAST BIOPSY  1986  . RIGHT KNEE  1971   Family History  Problem Relation Age of Onset  . Stroke Mother   . Kidney disease Father   . Stroke Brother    Social History   Occupational History  .  [place]    answers phones   Social History Main Topics  . Smoking status: Heavy Tobacco Smoker    Packs/day: 1.00    Types: Cigarettes  . Smokeless tobacco: Never Used     Comment: one pack Daily Maybe more.  . Alcohol use 0.6 oz/week    1 Shots of liquor per week  . Drug use: No  . Sexual activity: Not Currently    Partners: Male     Comment: husband    Tobacco Counseling Ready to quit: No Counseling given: No   Activities of Daily Living In your present state of health, do you have any difficulty performing the following activities: 02/26/2016  Hearing? Y  Vision? N  Difficulty concentrating or making decisions? Y  Walking or climbing stairs? Y  Dressing or bathing? N  Doing errands, shopping? N  Preparing Food and eating ? N  Using the Toilet? N  In the past six months, have you accidently leaked urine? Y  Do you have problems with loss of bowel control? N  Managing your Medications? N  Managing your Finances? N  Housekeeping or managing your Housekeeping? N  Some recent data might be hidden    Immunizations and Health Maintenance Immunization History  Administered Date(s) Administered  . Influenza,inj,Quad PF,36+ Mos 12/31/2015  . Influenza-Unspecified 01/24/2013, 01/02/2014, 02/18/2015  . Pneumococcal Conjugate-13 12/31/2015  . Pneumococcal Polysaccharide-23 04/27/2012  . Td 05/25/1988  . Tdap 02/03/2014  . Zoster 04/28/2010   Health Maintenance Due  Topic Date Due  . COLONOSCOPY  01/03/2000  . DEXA SCAN  01/03/2015    Patient Care Team: Estill Dooms, MD as PCP - General (Internal Medicine) Mcarthur Rossetti, MD as Attending  Physician (Orthopedic Surgery) Jacelyn Pi, MD as Consulting Physician (Endocrinology)  Indicate any recent Medical Services you may have received from other than [facility] providers in the past year (date may be approximate).     Assessment:   This is a routine wellness examination for Kera.   Hearing/Vision screen Hearing Screening Comments: Unsure of last hearing screen.  Vision Screening Comments: Last eye exam done in Sept. 2017.  Dietary issues and exercise activities discussed: Current Exercise Habits: Home exercise routine, Type of exercise: stretching, Time (Minutes): 30, Frequency (Times/Week): 3, Weekly Exercise (Minutes/Week): 90, Intensity: Mild, Exercise limited by: orthopedic condition(s)  Goals    .  Quit smoking / using tobacco          Starting 02/26/16, I will attempt to quit smoking.      Depression Screen PHQ 2/9 Scores 02/26/2016 11/16/2012 08/31/2012  PHQ - 2 Score 1 0 0    Fall Risk Fall Risk  02/26/2016 07/02/2015 06/04/2015 05/01/2015 03/26/2015  Falls in the past year? No No No No No  Number falls in past yr: - - - - -  Injury with Fall? - - - - -    Cognitive Function: MMSE - Mini Mental State Exam 02/26/2016  Orientation to time 5  Orientation to Place 5  Registration 3  Attention/ Calculation 5  Recall 3  Language- name 2 objects 2  Language- repeat 1  Language- follow 3 step command 3  Language- read & follow direction 1  Write a sentence 1  Copy design 1  Total score 30        Screening Tests Health Maintenance  Topic Date Due  . COLONOSCOPY  01/03/2000  . DEXA SCAN  01/03/2015  . MAMMOGRAM  04/23/2017 (Originally 01/03/2000)  . PNA vac Low Risk Adult (2 of 2 - PPSV23) 04/27/2017  . TETANUS/TDAP  02/04/2024  . INFLUENZA VACCINE  Completed  . ZOSTAVAX  Completed  . Hepatitis C Screening  Completed      Plan:  I have personally reviewed and addressed the Medicare Annual Wellness questionnaire and have noted the following in the  patient's chart:  A. Medical and social history B. Use of alcohol, tobacco or illicit drugs  C. Current medications and supplements D. Functional ability and status E.  Nutritional status F.  Physical activity G. Advance directives H. List of other physicians I.  Hospitalizations, surgeries, and ER visits in previous 12 months J.  [place] to include hearing, vision, cognitive, depression L. Referrals and appointments - none  In addition, I have reviewed and discussed with patient certain preventive protocols, quality metrics, and best practice recommendations. A written personalized care plan for preventive services as well as general preventive health recommendations were provided to patient.  See attached scanned questionnaire for additional information.   Signed,   Allyn Kenner, LPN Health Advisor   I have reviewed the information entered by the Health Advisor. I was present in the office during the time of patient interaction and was available for consultation. I agree with the documentation and advice.  Viviann Spare Nyoka Cowden, MD

## 2016-02-26 NOTE — Patient Instructions (Signed)
[name] , Thank you for taking time to come for your Medicare Wellness Visit. I appreciate your ongoing commitment to your health goals. Please review the following plan we discussed and let me know if I can assist you in the future.   These are the goals we discussed: Goals    None      This is a list of the screening recommended for you and due dates:  Health Maintenance  Topic Date Due  . Mammogram  01/03/2000  . Colon Cancer Screening  01/03/2000  . DEXA scan (bone density measurement)  01/03/2015  . Pneumonia vaccines (2 of 2 - PPSV23) 04/27/2017  . Tetanus Vaccine  02/04/2024  . Flu Shot  Completed  . Shingles Vaccine  Completed  .  Hepatitis C: One time screening is recommended by Center for Disease Control  (CDC) for  adults born from [birth year] through 1966.   Completed  Preventive Care for Adults  A healthy lifestyle and preventive care can promote health and wellness. Preventive health guidelines for adults include the following key practices.  . A routine yearly physical is a good way to check with your health care provider about your health and preventive screening. It is a chance to share any concerns and updates on your health and to receive a thorough exam.  . Visit your dentist for a routine exam and preventive care every 6 months. Brush your teeth twice a day and floss once a day. Good oral hygiene prevents tooth decay and gum disease.  . The frequency of eye exams is based on your age, health, family medical history, use  of contact lenses, and other factors. Follow your health care provider's ecommendations for frequency of eye exams.  . Eat a healthy diet. Foods like vegetables, fruits, whole grains, low-fat dairy products, and lean protein foods contain the nutrients you need without too many calories. Decrease your intake of foods high in solid fats, added sugars, and salt. Eat the right amount of calories for you. Get information about a proper diet from your  health care provider, if necessary.  . Regular physical exercise is one of the most important things you can do for your health. Most adults should get at least 150 minutes of moderate-intensity exercise (any activity that increases your heart rate and causes you to sweat) each week. In addition, most adults need muscle-strengthening exercises on 2 or more days a week.  Silver Sneakers may be a benefit available to you. To determine eligibility, you may visit the website: www.silversneakers.com or contact program at 937-247-6427 Mon-Fri between 8AM-8PM.   . Maintain a healthy weight. The body mass index (BMI) is a screening tool to identify possible weight problems. It provides an estimate of body fat based on height and weight. Your health care provider can find your BMI and can help you achieve or maintain a healthy weight.   For adults 20 years and older: ? A BMI below 18.5 is considered underweight. ? A BMI of 18.5 to 24.9 is normal. ? A BMI of 25 to 29.9 is considered overweight. ? A BMI of 30 and above is considered obese.   . Maintain normal blood lipids and cholesterol levels by exercising and minimizing your intake of saturated fat. Eat a balanced diet with plenty of fruit and vegetables. Blood tests for lipids and cholesterol should begin at age 57 and be repeated every 5 years. If your lipid or cholesterol levels are high, you are over 50, or you  are at high risk for heart disease, you may need your cholesterol levels checked more frequently. Ongoing high lipid and cholesterol levels should be treated with medicines if diet and exercise are not working.  . If you smoke, find out from your health care provider how to quit. If you do not use tobacco, please do not start.  . If you choose to drink alcohol, please do not consume more than 2 drinks per day. One drink is considered to be 12 ounces (355 mL) of beer, 5 ounces (148 mL) of wine, or 1.5 ounces (44 mL) of liquor.  . If you are  66-20 years old, ask your health care provider if you should take aspirin to prevent strokes.  . Use sunscreen. Apply sunscreen liberally and repeatedly throughout the day. You should seek shade when your shadow is shorter than you. Protect yourself by wearing long sleeves, pants, a wide-brimmed hat, and sunglasses year round, whenever you are outdoors.  . Once a month, do a whole body skin exam, using a mirror to look at the skin on your back. Tell your health care provider of new moles, moles that have irregular borders, moles that are larger than a pencil eraser, or moles that have changed in shape or color.

## 2016-05-06 ENCOUNTER — Other Ambulatory Visit: Payer: Self-pay | Admitting: Obstetrics

## 2016-05-06 DIAGNOSIS — N762 Acute vulvitis: Secondary | ICD-10-CM | POA: Diagnosis not present

## 2016-05-06 DIAGNOSIS — N909 Noninflammatory disorder of vulva and perineum, unspecified: Secondary | ICD-10-CM | POA: Diagnosis not present

## 2016-05-14 DIAGNOSIS — N904 Leukoplakia of vulva: Secondary | ICD-10-CM | POA: Insufficient documentation

## 2016-06-26 ENCOUNTER — Telehealth: Payer: Self-pay | Admitting: *Deleted

## 2016-06-26 NOTE — Telephone Encounter (Signed)
Patient called and stated that she received a letter stating that she needs to schedule a Mammogram. Patient called and stated that she would like a Sonogram scheduled first because she has had several mammograms and afterwards she has had shooting pains and ends up having to have a sonogram anyway because they can't read the Mammogram.  So she just wants to have the sonogram instead of the Mammogram. Please Advise.

## 2016-06-29 NOTE — Telephone Encounter (Signed)
Schedule what she wants if insurance will approve.

## 2016-06-30 NOTE — Telephone Encounter (Signed)
Called [facility] to place an order for Sonogram and per Radiologist Protocol a Mammogram has to be performed before a sonogram can be ordered. They cannot just perform a Sonogram.   I called patient and spoke with husband because patient is taking a nap and informed him of the response. He stated that he is going to call the insurance and see if they will cover a sonogram in place of a mammogram and where she can go to have this done and will call us back with that information.

## 2016-07-01 ENCOUNTER — Ambulatory Visit (INDEPENDENT_AMBULATORY_CARE_PROVIDER_SITE_OTHER): Payer: PPO | Admitting: Internal Medicine

## 2016-07-01 ENCOUNTER — Encounter: Payer: Self-pay | Admitting: Internal Medicine

## 2016-07-01 VITALS — BP 138/86 | HR 74 | Temp 97.9°F | Ht 68.0 in | Wt 189.0 lb

## 2016-07-01 DIAGNOSIS — M7632 Iliotibial band syndrome, left leg: Secondary | ICD-10-CM

## 2016-07-01 DIAGNOSIS — M7630 Iliotibial band syndrome, unspecified leg: Secondary | ICD-10-CM | POA: Insufficient documentation

## 2016-07-01 DIAGNOSIS — R252 Cramp and spasm: Secondary | ICD-10-CM | POA: Diagnosis not present

## 2016-07-01 DIAGNOSIS — G8929 Other chronic pain: Secondary | ICD-10-CM | POA: Diagnosis not present

## 2016-07-01 DIAGNOSIS — M545 Low back pain: Secondary | ICD-10-CM | POA: Diagnosis not present

## 2016-07-01 DIAGNOSIS — I10 Essential (primary) hypertension: Secondary | ICD-10-CM | POA: Diagnosis not present

## 2016-07-01 DIAGNOSIS — R202 Paresthesia of skin: Secondary | ICD-10-CM

## 2016-07-01 DIAGNOSIS — R05 Cough: Secondary | ICD-10-CM

## 2016-07-01 DIAGNOSIS — R059 Cough, unspecified: Secondary | ICD-10-CM

## 2016-07-01 DIAGNOSIS — N904 Leukoplakia of vulva: Secondary | ICD-10-CM

## 2016-07-01 DIAGNOSIS — E039 Hypothyroidism, unspecified: Secondary | ICD-10-CM
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
## 2016-07-06 ENCOUNTER — Telehealth: Payer: Self-pay | Admitting: Internal Medicine

## 2016-07-06 NOTE — Telephone Encounter (Signed)
Unfortunately, this is a problem with her insurance. As I understand the situation, the insurance does not want to pay for the [place] without the mammogram first.

## 2016-07-06 NOTE — Telephone Encounter (Signed)
Spoke w/ Mrs.  [name]. Stated she decline having a Mammogram because of the pain and the fact that the pain last so long.  She wants  to have a Ultrasound instead.  [facility]  fyi to Dr. Nyoka Cowden.

## 2016-07-21 DIAGNOSIS — L900 Lichen sclerosus et atrophicus: Secondary | ICD-10-CM | POA: Diagnosis not present

## 2016-08-06 ENCOUNTER — Other Ambulatory Visit: Payer: Self-pay | Admitting: Endocrinology

## 2016-08-06 DIAGNOSIS — E049 Nontoxic goiter, unspecified: Secondary | ICD-10-CM

## 2016-08-13 ENCOUNTER — Ambulatory Visit
Admission: RE | Admit: 2016-08-13 | Discharge: 2016-08-13 | Disposition: A | Discharge status: Home / Self Care | Payer: PPO | Source: Ambulatory Visit | Attending: Endocrinology | Admitting: Endocrinology

## 2016-08-13 DIAGNOSIS — E049 Nontoxic goiter, unspecified: Secondary | ICD-10-CM

## 2016-08-13 DIAGNOSIS — E039 Hypothyroidism, unspecified: Secondary | ICD-10-CM | POA: Diagnosis not present

## 2016-08-13 DIAGNOSIS — E042 Nontoxic multinodular goiter: Secondary | ICD-10-CM | POA: Diagnosis not present

## 2016-08-18 ENCOUNTER — Ambulatory Visit: Payer: PPO | Admitting: Nurse Practitioner

## 2016-08-21 ENCOUNTER — Encounter (INDEPENDENT_AMBULATORY_CARE_PROVIDER_SITE_OTHER): Payer: Self-pay | Admitting: Orthopaedic Surgery

## 2016-08-21 ENCOUNTER — Ambulatory Visit (INDEPENDENT_AMBULATORY_CARE_PROVIDER_SITE_OTHER): Payer: PPO

## 2016-08-21 ENCOUNTER — Ambulatory Visit (INDEPENDENT_AMBULATORY_CARE_PROVIDER_SITE_OTHER): Payer: PPO | Admitting: Orthopaedic Surgery

## 2016-08-21 VITALS — BP 137/81 | HR 96

## 2016-08-21 DIAGNOSIS — M67449 Ganglion, unspecified hand: Secondary | ICD-10-CM | POA: Diagnosis not present

## 2016-08-21 DIAGNOSIS — M79642 Pain in left hand: Secondary | ICD-10-CM | POA: Diagnosis not present

## 2016-08-21 DIAGNOSIS — M79644 Pain in right finger(s): Secondary | ICD-10-CM

## 2016-08-21 MED ORDER — LIDOCAINE HCL 1 % IJ SOLN
0.3000 mL | INTRAMUSCULAR | Status: AC | PRN
  Administered 2016-08-21: .3 mL

## 2016-08-21 NOTE — Progress Notes (Signed)
Office Visit Note   Patient: [name]           Date of Birth: 13-Jun-1950           MRN: [id number] Visit Date: 08/21/2016              Requested by: Estill Dooms, MD [address] [place], [place] [address] PCP: Jeanmarie Hubert, MD   Assessment & Plan: Visit Diagnoses:  1. Thumb pain, right   2. Digital mucinous cyst          Right thumb IP joint, dorsal radial.  Plan: Patient was prepped with alcohol due to her Betadine allergy with blistering in the past. After alcohol prep mucinous cyst was aspirated with typical ganglion type fluid. No purulence. Dorsal splint applied to immobilize the IP joint right thumb. Recheck 5-7 days. She can remove the splint to wash her hand keep it clean and apply some peroxide and then reapply the splint.  Follow-Up Instructions: Return in about 1 week (around 08/28/2016).   Orders:  Orders Placed This Encounter  Procedures  . XR Finger Thumb Right   No orders of the defined types were placed in this encounter.     Procedures: Small Joint Inj Date/Time: 08/21/2016 1:58 PM Performed by: Marybelle Killings Authorized by: Rodell Perna C   Location:  Thumb Ultrasound Guided: No   Fluoroscopic Guidance: No   Medications:  0.3 mL lidocaine 1 % Aspiration Attempted: Yes   Aspirate amount (mL):  1 Aspirate characteristics: mucinous cyst ganglion fluid.     Clinical Data: No additional findings.   Subjective: Chief Complaint  Patient presents with  . Right Thumb - Pain    HPI patient's had a painful right thumb dorsal radial cyst adjacent to the IP joint of the thumb close to the base of the nail which is been extremely painful. It is changed in size and was a little bit larger a few days ago. She's had previous mucinous cyst removed from MCP joint by Dr. Ninfa Linden in the past. Patient's right hand and has increased problems with activities and states it hurts her all the time. No history of trauma. She has another cyst present adjacent  to her left MCP joint the web space but this is not painful or tender doesn't bother her at all. No rheumatologic conditions. Patient states cold days ago she had some red streaks that were running up her thumb no chills or fever  Review of Systems 14 point review of systems updated and is unchanged in noncontributory other than as mentioned in history of present illness.   Objective: Vital Signs: BP 137/81   Pulse 96   Physical Exam  Constitutional: She is oriented to person, place, and time. She appears well-developed.  HENT:  Head: Normocephalic.  Right Ear: External ear normal.  Left Ear: External ear normal.  Eyes: Pupils are equal, round, and reactive to light.  Neck: No tracheal deviation present. No thyromegaly present.  Cardiovascular: Normal rate.   Pulmonary/Chest: Effort normal.  Abdominal: Soft.  Musculoskeletal:  Patient has mild upper sure many tremor which is non-intentional. Tremor was worse after aspiration. Good cervical range of motion a little reach full extension. Palpable cyst left hand adjacent to long finger MCP joint. Nontender and 8 x 8 mm round. Right index finger mucinous cyst adjacent to the DIP joint on the radial aspect and distal to the joint. Extremely tender. Nail bed and nail plate is normal. Extensor and FPL  is normal. Small nodule palpable in the flexor tendon at the A1 pulley but no triggering. She related a history of previous trigger thumb injection in the distant past.  Neurological: She is alert and oriented to person, place, and time.  Skin: Skin is warm and dry. No rash noted. No erythema.  Psychiatric: She has a normal mood and affect. Her behavior is normal.    Ortho Exam  Specialty Comments:  No specialty comments available.  Imaging: Xr Finger Thumb Right  Result Date: 08/21/2016 Three-view x-rays right thumb obtained. Patient has minimal thumb IP degenerative changes with tiny 1 mm medial and lateral spur. Slight calcification at  the terminal extensor attachment to the distal phalanx. Impression: Mild osteoarthritis right thumb IP joint. Negative for fracture.    PMFS History: Patient Active Problem List   Diagnosis Date Noted  . Iliotibial band syndrome 07/01/2016  . Paresthesia 07/01/2016  . Cramp of toe 07/01/2016  . Lichen sclerosus of female genitalia 05/14/2016  . Low back pain 06/04/2015  . Optic neuritis 05/01/2015  . Chronic pain 05/01/2015  . Tobacco abuse 06/04/2014  . Asthma with bronchitis 06/04/2014  . H/O cardiovascular disorder 03/17/2014  . Anterior optic neuritis 03/17/2014  . Seasonal and perennial allergic rhinitis 03/06/2014  . H/O thrombosis 12/24/2013  . Myalgia and myositis 08/23/2013  . Cough 08/23/2013  . Non-toxic uninodular goiter 03/07/2013  . Abnormality of gait 11/22/2012  . Allergy 11/16/2012  . Hypothyroidism 08/31/2012  . Essential hypertension 08/31/2012  . Personal history of fall 08/31/2012  . Pain in joint, lower leg 08/31/2012  . Obstructive sleep apnea 08/31/2012   Past Medical History:  Diagnosis Date  . Abdominal pain, epigastric   . Abdominal pain, epigastric   . Abdominal pain, right lower quadrant   . Abnormal weight gain   . Abnormality of gait   . Anxiety state, unspecified   . Asymptomatic varicose veins   . Blood vessel replaced by other means   . Cramp of limb   . Disorder of bone and cartilage, unspecified   . Dizziness and giddiness   . Edema   . Encounter for long-term (current) use of other medications   . Herpes zoster without mention of complication   . Herpes zoster without mention of complication   . Hypersomnia, unspecified   . Insomnia, unspecified   . Long term (current) use of anticoagulants   . Lumbago   . Lumbago   . Muscle weakness (generalized)   . Myalgia and myositis, unspecified   . Obesity, unspecified   . Optic neuritis   . Other abnormal blood chemistry   . Other and unspecified hyperlipidemia   . Other cataract    . Pain in joint, site unspecified   . Pain in limb   . Pain in limb   . Palpitations   . Phlebitis and thrombophlebitis of lower extremities, unspecified   . Phlebitis and thrombophlebitis of other deep vessels of lower extremities   . Rash and other nonspecific skin eruption   . Spasm of muscle   . Syncope and collapse   . Tobacco use disorder   . Unspecified essential hypertension   . Unspecified hypothyroidism   . Unspecified sinusitis (chronic)   . Unspecified tinnitus     Family History  Problem Relation Age of Onset  . Stroke Mother   . Kidney disease Father   . Stroke Brother     Past Surgical History:  Procedure Laterality Date  . [place] FILTER IMPLANT  2008   DR DICKERSON  . HIP SURGERY LEFT  03/2010   DR [facility]  . HIP SURGERY RIGHT  06/2010   DR [facility]  . LEFT SHOULDER  2012   DR [facility]   . LOWER BACK  05/26/2007   DR JEFF BEANE  . RIGHT BREAST BIOPSY  1986  . RIGHT KNEE  1971   Social History   Occupational History  .  [employer]    answers phones   Social History Main Topics  . Smoking status: Heavy Tobacco Smoker    Packs/day: 1.00    Types: Cigarettes  . Smokeless tobacco: Never Used     Comment: one pack Daily Maybe more.  . Alcohol use 0.6 oz/week    1 Shots of liquor per week  . Drug use: No  . Sexual activity: Not Currently    Partners: Male     Comment: husband

## 2016-08-26 ENCOUNTER — Ambulatory Visit (INDEPENDENT_AMBULATORY_CARE_PROVIDER_SITE_OTHER): Payer: PPO | Admitting: Orthopaedic Surgery

## 2016-08-26 ENCOUNTER — Encounter (INDEPENDENT_AMBULATORY_CARE_PROVIDER_SITE_OTHER): Payer: Self-pay | Admitting: Orthopaedic Surgery

## 2016-08-26 VITALS — BP 123/80 | HR 74 | Ht 68.0 in | Wt 189.0 lb

## 2016-08-26 DIAGNOSIS — M79644 Pain in right finger(s): Secondary | ICD-10-CM

## 2016-08-26 NOTE — Progress Notes (Signed)
Office Visit Note   Patient: [name]           Date of Birth: 31-Jan-1951           MRN: [id number] Visit Date: 08/26/2016              Requested by: Estill Dooms, MD [address] [place], [place] [address] PCP: Jeanmarie Hubert, MD   Assessment & Plan: Visit Diagnoses: Digital mucinous cyst right thumb  Plan: Cyst is significantly smaller after aspiration last office visit. Discontinue splint she can return if she has recurrence or increased pain and would like to have excised as an outpatient surgical procedure.  Follow-Up Instructions: Return if symptoms worsen or fail to improve.   Orders:  No orders of the defined types were placed in this encounter.  No orders of the defined types were placed in this encounter.     Procedures: No procedures performed   Clinical Data: No additional findings.   Subjective: Chief Complaint  Patient presents with  . Right Thumb - Pain, Follow-up    HPI patient returns with ongoing right thumb pain typically with driving. She had aspiration of a mucinous cyst right thumb IP joint. She's gotten slight recurrence had some erythema. She was [place] wave [place] the [place] and her splint got wet had bad odor and she removed it. She was concerned that removing would cause problems. Patient denies associated neck pain no fever chills no stiffness in the thumb. She has been afebrile.  Review of Systems 14 point review of systems is unchanged from 08/21/2016 other than above.   Objective: Vital Signs: BP 123/80   Pulse 74   Ht 5\' 8"  (1.727 m)   Wt 189 lb (85.7 kg)   BMI 28.74 kg/m   Physical Exam  Constitutional: She is oriented to person, place, and time. She appears well-developed.  HENT:  Head: Normocephalic.  Right Ear: External ear normal.  Left Ear: External ear normal.  Eyes: Pupils are equal, round, and reactive to light.  Neck: No tracheal deviation present. No thyromegaly present.  Cardiovascular: Normal rate.     Pulmonary/Chest: Effort normal.  Abdominal: Soft.  Musculoskeletal:  No cellulitis. Extensor flexor tendon function is intact. There is so 1-2 mm prominence of the cyst which has recurred it's minimally tender IP joint range of motion extensor flexor function of the right thumb is intact 2 point sensation is intact. No triggering over the flexor tendon sheath. Elbow shoulder range of motion is normal normal heel toe gait no synovitis. Opposite left hand shows unchanged cyst in the web space which is not particularly tender doesn't bother.  Neurological: She is alert and oriented to person, place, and time.  Skin: Skin is warm and dry.  Psychiatric: She has a normal mood and affect. Her behavior is normal.    Ortho Exam  Specialty Comments:  No specialty comments available.  Imaging: No results found.   PMFS History: Patient Active Problem List   Diagnosis Date Noted  . Iliotibial band syndrome 07/01/2016  . Paresthesia 07/01/2016  . Cramp of toe 07/01/2016  . Lichen sclerosus of female genitalia 05/14/2016  . Low back pain 06/04/2015  . Optic neuritis 05/01/2015  . Chronic pain 05/01/2015  . Tobacco abuse 06/04/2014  . Asthma with bronchitis 06/04/2014  . H/O cardiovascular disorder 03/17/2014  . Anterior optic neuritis 03/17/2014  . Seasonal and perennial allergic rhinitis 03/06/2014  . H/O thrombosis 12/24/2013  . Myalgia and  myositis 08/23/2013  . Cough 08/23/2013  . Non-toxic uninodular goiter 03/07/2013  . Abnormality of gait 11/22/2012  . Allergy 11/16/2012  . Hypothyroidism 08/31/2012  . Essential hypertension 08/31/2012  . Personal history of fall 08/31/2012  . Pain in joint, lower leg 08/31/2012  . Obstructive sleep apnea 08/31/2012   Past Medical History:  Diagnosis Date  . Abdominal pain, epigastric   . Abdominal pain, epigastric   . Abdominal pain, right lower quadrant   . Abnormal weight gain   . Abnormality of gait   . Anxiety state, unspecified    . Asymptomatic varicose veins   . Blood vessel replaced by other means   . Cramp of limb   . Disorder of bone and cartilage, unspecified   . Dizziness and giddiness   . Edema   . Encounter for long-term (current) use of other medications   . Herpes zoster without mention of complication   . Herpes zoster without mention of complication   . Hypersomnia, unspecified   . Insomnia, unspecified   . Long term (current) use of anticoagulants   . Lumbago   . Lumbago   . Muscle weakness (generalized)   . Myalgia and myositis, unspecified   . Obesity, unspecified   . Optic neuritis   . Other abnormal blood chemistry   . Other and unspecified hyperlipidemia   . Other cataract   . Pain in joint, site unspecified   . Pain in limb   . Pain in limb   . Palpitations   . Phlebitis and thrombophlebitis of lower extremities, unspecified   . Phlebitis and thrombophlebitis of other deep vessels of lower extremities   . Rash and other nonspecific skin eruption   . Spasm of muscle   . Syncope and collapse   . Tobacco use disorder   . Unspecified essential hypertension   . Unspecified hypothyroidism   . Unspecified sinusitis (chronic)   . Unspecified tinnitus     Family History  Problem Relation Age of Onset  . Stroke Mother   . Kidney disease Father   . Stroke Brother     Past Surgical History:  Procedure Laterality Date  . [place] IMPLANT  2008   DR DICKERSON  . HIP SURGERY LEFT  03/2010   DR [facility]  . HIP SURGERY RIGHT  06/2010   DR [facility]  . LEFT SHOULDER  2012   DR [facility]   . LOWER BACK  05/26/2007   DR JEFF BEANE  . RIGHT BREAST BIOPSY  1986  . RIGHT KNEE  1971   Social History   Occupational History  .  [place]    answers phones   Social History Main Topics  . Smoking status: Heavy Tobacco Smoker    Packs/day: 1.00    Types: Cigarettes  . Smokeless tobacco: Never Used     Comment: one pack Daily Maybe more.  . Alcohol use  0.6 oz/week    1 Shots of liquor per week  . Drug use: No  . Sexual activity: Not Currently    Partners: Male     Comment: husband

## 2016-09-08 DIAGNOSIS — R635 Abnormal weight gain: Secondary | ICD-10-CM | POA: Diagnosis not present

## 2016-09-08 DIAGNOSIS — E039 Hypothyroidism, unspecified: Secondary | ICD-10-CM | POA: Diagnosis not present

## 2016-09-08 DIAGNOSIS — E049 Nontoxic goiter, unspecified: Secondary | ICD-10-CM | POA: Diagnosis not present

## 2016-09-10 ENCOUNTER — Encounter: Payer: Self-pay | Admitting: Neurology

## 2016-09-10 ENCOUNTER — Ambulatory Visit (INDEPENDENT_AMBULATORY_CARE_PROVIDER_SITE_OTHER): Payer: PPO | Admitting: Neurology

## 2016-09-10 VITALS — BP 140/84 | HR 76 | Ht 68.0 in | Wt 185.0 lb

## 2016-09-10 DIAGNOSIS — M542 Cervicalgia: Secondary | ICD-10-CM | POA: Diagnosis not present

## 2016-09-10 DIAGNOSIS — M545 Low back pain: Secondary | ICD-10-CM | POA: Diagnosis not present

## 2016-09-10 DIAGNOSIS — G8929 Other chronic pain: Secondary | ICD-10-CM

## 2016-09-10 MED ORDER — DULOXETINE HCL 60 MG PO CPEP: 60.0000 mg | ORAL_CAPSULE | Freq: Every day | ORAL | 12 refills | Status: DC
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
## 2016-09-11 ENCOUNTER — Telehealth: Payer: Self-pay | Admitting: Neurology

## 2016-09-11 NOTE — Telephone Encounter (Signed)
Pt said for the 1st time last night she took DULoxetine (CYMBALTA) 60 MG capsule and around 3:00am she was vomiting. When she woke up she felt like she was "hung over", also had dry mouth and what she vomited was white, tasted as if she had been chewing aspirin.  Pt also heard there was a virus going on and wants to know if that was what she had or if this is as a result of the medication, if so she no longer wants to take the medication.  Please call

## 2016-09-11 NOTE — Telephone Encounter (Signed)
Pt has called back to inform that she is now for certain that it is the medication because she now feels like from her shoulders to her hands it feels like someone is running insulation through her which is the beginning of hives.  Burning itching feeling and is still feeling nauseated.

## 2016-09-11 NOTE — Telephone Encounter (Signed)
I have called her, she only take one tablet of Cymbalta 60 mg last night, now woke up this morning felt nausea, dry mouth, whole-body itching,  I have suggested her stop Cymbalta, keep well hydration,  Call back to clinic if she continue to have symptoms in 3-5 days

## 2016-09-14 ENCOUNTER — Encounter: Payer: Self-pay | Admitting: *Deleted

## 2016-09-16 ENCOUNTER — Telehealth: Payer: Self-pay | Admitting: Neurology

## 2016-09-16 NOTE — Telephone Encounter (Signed)
I have called patient and left her a message insurance pending on NCV/ [facility] [employer].

## 2016-09-17 NOTE — Telephone Encounter (Signed)
Auth is processing [facility]CV/[facility] 11/25/2016 at [facility] Dr. Krista Blue.

## 2016-10-22 DIAGNOSIS — L900 Lichen sclerosus et atrophicus: Secondary | ICD-10-CM | POA: Diagnosis not present

## 2016-10-22 DIAGNOSIS — Z6828 Body mass index (BMI) 28.0-28.9, adult: Secondary | ICD-10-CM | POA: Diagnosis not present

## 2016-10-27 ENCOUNTER — Ambulatory Visit (INDEPENDENT_AMBULATORY_CARE_PROVIDER_SITE_OTHER): Payer: PPO

## 2016-10-27 ENCOUNTER — Ambulatory Visit (INDEPENDENT_AMBULATORY_CARE_PROVIDER_SITE_OTHER): Payer: PPO | Admitting: Orthopaedic Surgery

## 2016-10-27 VITALS — BP 129/71 | Ht 67.0 in | Wt 180.0 lb

## 2016-10-27 DIAGNOSIS — M79641 Pain in right hand: Secondary | ICD-10-CM
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
## 2016-11-25 ENCOUNTER — Ambulatory Visit (INDEPENDENT_AMBULATORY_CARE_PROVIDER_SITE_OTHER): Payer: PPO | Admitting: Neurology

## 2016-11-25 DIAGNOSIS — G8929 Other chronic pain: Secondary | ICD-10-CM

## 2016-11-25 DIAGNOSIS — R202 Paresthesia of skin: Secondary | ICD-10-CM | POA: Diagnosis not present

## 2016-11-25 DIAGNOSIS — M542 Cervicalgia: Secondary | ICD-10-CM

## 2016-11-25 DIAGNOSIS — M545 Low back pain: Secondary | ICD-10-CM

## 2016-11-25 DIAGNOSIS — M79603 Pain in arm, unspecified: Secondary | ICD-10-CM

## 2016-11-25 MED ORDER — GABAPENTIN 100 MG PO CAPS: 300.0000 mg | ORAL_CAPSULE | Freq: Every day | ORAL | 6 refills | Status: DC

## 2016-11-25 MED ORDER — LIDOCAINE 4 % EX CREA: 1.0000 "application " | TOPICAL_CREAM | CUTANEOUS | 6 refills | Status: DC | PRN

## 2016-11-26 DIAGNOSIS — M79603 Pain in arm, unspecified: Secondary | ICD-10-CM | POA: Insufficient documentation

## 2016-11-26 LAB — CBC WITH DIFFERENTIAL/PLATELET
BASOS ABS: 0.1 10*3/uL (ref 0.0–0.2)
Basos: 1 %
EOS (ABSOLUTE): 0.1 10*3/uL (ref 0.0–0.4)
EOS: 1 %
HEMOGLOBIN: 14.8 g/dL (ref 11.1–15.9)
Hematocrit: 44.2 % (ref 34.0–46.6)
IMMATURE GRANS (ABS): 0 10*3/uL (ref 0.0–0.1)
IMMATURE GRANULOCYTES: 0 %
LYMPHS: 29 %
Lymphocytes Absolute: 2.3 10*3/uL (ref 0.7–3.1)
MCH: 28 pg (ref 26.6–33.0)
MCHC: 33.5 g/dL (ref 31.5–35.7)
MCV: 84 fL (ref 79–97)
MONOCYTES: 7 %
Monocytes Absolute: 0.6 10*3/uL (ref 0.1–0.9)
NEUTROS PCT: 62 %
Neutrophils Absolute: 4.8 10*3/uL (ref 1.4–7.0)
Platelets: 273 10*3/uL (ref 150–379)
RBC: 5.29 x10E6/uL — AB (ref 3.77–5.28)
RDW: 14.7 % (ref 12.3–15.4)
WBC: 7.8 10*3/uL (ref 3.4–10.8)

## 2016-11-26 LAB — COMPREHENSIVE METABOLIC PANEL
ALBUMIN: 4.5 g/dL (ref 3.6–4.8)
ALK PHOS: 85 IU/L (ref 39–117)
ALT: 6 IU/L (ref 0–32)
AST: 14 IU/L (ref 0–40)
Albumin/Globulin Ratio: 2.3 — ABNORMAL HIGH (ref 1.2–2.2)
BUN / CREAT RATIO: 15 (ref 12–28)
BUN: 11 mg/dL (ref 8–27)
Bilirubin Total: 0.5 mg/dL (ref 0.0–1.2)
CALCIUM: 9.4 mg/dL (ref 8.7–10.3)
CO2: 24 mmol/L (ref 20–29)
CREATININE: 0.72 mg/dL (ref 0.57–1.00)
Chloride: 103 mmol/L (ref 96–106)
GFR calc Af Amer: 101 mL/min/{1.73_m2} (ref >59)
GFR, EST NON AFRICAN AMERICAN: 88 mL/min/{1.73_m2} (ref >59)
GLUCOSE: 90 mg/dL (ref 65–99)
Globulin, Total: 2 g/dL (ref 1.5–4.5)
Potassium: 5 mmol/L (ref 3.5–5.2)
Sodium: 140 mmol/L (ref 134–144)
TOTAL PROTEIN: 6.5 g/dL (ref 6.0–8.5)

## 2016-11-26 LAB — VITAMIN D 25 HYDROXY (VIT D DEFICIENCY, FRACTURES): VIT D 25 HYDROXY: 27.4 ng/mL — AB (ref 30.0–100.0)

## 2016-11-26 LAB — C-REACTIVE PROTEIN: CRP: 3.4 mg/L (ref 0.0–4.9)

## 2016-11-26 LAB — ENA+DNA/DS+SJORGEN'S
ENA SM AB SER-ACNC: 1.3 AI — AB (ref 0.0–0.9)
ENA SSB (LA) Ab: <0.2 AI (ref 0.0–0.9)
dsDNA Ab: <1 IU/mL (ref 0–9)

## 2016-11-26 LAB — ANA W/REFLEX: ANA: POSITIVE — AB

## 2016-11-26 LAB — LIPID PANEL
CHOL/HDL RATIO: 3.6 ratio (ref 0.0–4.4)
Cholesterol, Total: 221 mg/dL — ABNORMAL HIGH (ref 100–199)
HDL: 61 mg/dL (ref >39)
LDL Calculated: 139 mg/dL — ABNORMAL HIGH (ref 0–99)
Triglycerides: 105 mg/dL (ref 0–149)
VLDL Cholesterol Cal: 21 mg/dL (ref 5–40)

## 2016-11-26 LAB — CK: CK TOTAL: 34 U/L (ref 24–173)

## 2016-11-26 LAB — VITAMIN B12: Vitamin B-12: 396 pg/mL (ref 232–1245)

## 2016-11-26 LAB — TSH: TSH: 1 u[IU]/mL (ref 0.450–4.500)

## 2016-11-26 LAB — SEDIMENTATION RATE: SED RATE: 2 mm/h (ref 0–40)
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
## 2016-11-26 NOTE — Procedures (Signed)
Full Name: [name] Gender: Female MRN #: [id number] Date of Birth: 2050/06/10    Visit Date: 11/25/16 11:36 Age: 66 Years 60 Months Old Examining Physician: Marcial Pacas, MD  Referring Physician: Krista Blue, MD History: 66 year old female with chronic bilateral hands deep achy pain, chronic neck pain  Summary of the test:  Nerve conduction study: Bilateral median and ulnar sensory and motor responses were normal.  Electromyography: Selective needle examination of right upper extremity and right cervical paraspinals were normal.  Conclusion: This is a normal study. There is no electrodiagnostic evidence bilateral upper extremity neuropathy or right cervical radiculopathy.    ------------------------------- Marcial Pacas, M.D.  [facility] [place], [place] [address] Tel: [phone] Fax: [phone]        [facility]    Nerve / Sites Muscle Latency Ref. Amplitude Ref. Rel Amp Segments Distance Velocity Ref. Area    ms ms mV mV %  cm m/s m/s mVms  L Median - APB     Wrist APB 3.6 ?4.4 6.2 ?4.0 100 Wrist - APB 7   26.3     Upper arm APB 7.8  6.3  102 Upper arm - Wrist 21 50 ?49 25.7  R Median - APB     Wrist APB 4.1 ?4.4 6.4 ?4.0 100 Wrist - APB 7   24.6     Upper arm APB 8.5  6.2  96.9 Upper arm - Wrist 24 54 ?49 24.8  L Ulnar - ADM     Wrist ADM 3.1 ?3.3 11.3 ?6.0 100 Wrist - ADM 7   39.1     B.Elbow ADM 6.5  10.6  93.8 B.Elbow - Wrist 19 56 ?49 37.6     A.Elbow ADM 8.9  10.2  95.9 A.Elbow - B.Elbow 12 50 ?49 37.0         A.Elbow - Wrist      R Ulnar - ADM     Wrist ADM 2.9 ?3.3 14.4 ?6.0 100 Wrist - ADM 7   41.7     B.Elbow ADM 6.7  13.3  92 B.Elbow - Wrist 19 50 ?49 41.5     A.Elbow ADM 9.1  12.7  95.9 A.Elbow - B.Elbow 12 51 ?49 35.8         A.Elbow - Wrist                 SNC    Nerve / Sites Rec. Site Peak Lat Ref.  Amp Ref. Segments Distance Peak Diff Ref.    ms ms V V  cm ms ms  L Median, Ulnar - Transcarpal comparison   Median Palm Wrist 2.2 ?2.2 64 ?35 Median Palm - Wrist 8       Ulnar Palm Wrist 2.1 ?2.2 14 ?12 Ulnar Palm - Wrist 8          Median Palm - Ulnar Palm  0.1 ?0.4  L Median - Orthodromic (Dig II, Mid palm)     Dig II Wrist 3.2 ?3.4 19 ?10 Dig II - Wrist 13    R Median - Orthodromic (Dig II, Mid palm)     Dig II Wrist 3.2 ?3.4 14 ?10 Dig II - Wrist 13    L Ulnar - Orthodromic, (Dig V, Mid palm)     Dig V Wrist 3.0 ?3.1 7 ?5 Dig V - Wrist 11    R Ulnar - Orthodromic, (Dig V, Mid palm)     Dig V Wrist 2.9 ?  3.1 11 ?5 Dig V - Wrist 66                 F  Wave    Nerve F Lat Ref.   ms ms  L Ulnar - ADM 31.8 ?32.0  R Ulnar - ADM 30.4 ?32.0         EMG full       EMG Summary Table    Spontaneous MUAP Recruitment  Muscle IA Fib PSW Fasc Other Amp Dur. Poly Pattern  R. Pronator teres Normal None None None _______ Normal Normal Normal Normal  R. Biceps brachii Normal None None None _______ Normal Normal Normal Normal  R. Deltoid Normal None None None _______ Normal Normal Normal Normal  R. Triceps brachii Normal None None None _______ Normal Normal Normal Normal  R. Brachioradialis Normal None None None _______ Normal Normal Normal Normal  R. Cervical paraspinals Normal None None None _______ Normal Normal Normal Normal

## 2016-12-07 DIAGNOSIS — H4720 Unspecified optic atrophy: Secondary | ICD-10-CM | POA: Diagnosis not present

## 2016-12-07 DIAGNOSIS — H26493 Other secondary cataract, bilateral: Secondary | ICD-10-CM | POA: Diagnosis not present

## 2016-12-25 ENCOUNTER — Encounter: Payer: Self-pay | Admitting: Nurse Practitioner

## 2016-12-25 ENCOUNTER — Ambulatory Visit (INDEPENDENT_AMBULATORY_CARE_PROVIDER_SITE_OTHER): Payer: PPO | Admitting: Nurse Practitioner

## 2016-12-25 VITALS — BP 124/78 | HR 75 | Temp 97.7°F | Resp 17 | Ht 67.0 in | Wt 179.0 lb

## 2016-12-25 DIAGNOSIS — J069 Acute upper respiratory infection, unspecified: Secondary | ICD-10-CM | POA: Diagnosis not present

## 2016-12-25 MED ORDER — DOXYCYCLINE HYCLATE 100 MG PO TABS: 100.0000 mg | ORAL_TABLET | Freq: Two times a day (BID) | ORAL | 0 refills | Status: DC

## 2016-12-25 NOTE — Patient Instructions (Addendum)
To use Claritin 10 mg by mouth daily instead of benadryl  To use mucinex DM by mouth twice daily with full glass of water. Increase water intake throughout the day Tylenol 325 mg 2 tablets- 650 mg by mouth every 6 hours as needed for body aches- do not take more than 3000 mg in 24 hours To use doxycycline if symptoms or worsening or fail to improve     Acute Bronchitis, Adult Acute bronchitis is when air tubes (bronchi) in the lungs suddenly get swollen. The condition can make it hard to breathe. It can also cause these symptoms:  A cough.  Coughing up clear, yellow, or green mucus.  Wheezing.  Chest congestion.  Shortness of breath.  A fever.  Body aches.  Chills.  A sore throat.  Follow these instructions at home: Medicines  Take over-the-counter and prescription medicines only as told by your doctor.  If you were prescribed an antibiotic medicine, take it as told by your doctor. Do not stop taking the antibiotic even if you start to feel better. General instructions  Rest.  Drink enough fluids to keep your pee (urine) clear or pale yellow.  Avoid smoking and secondhand smoke. If you smoke and you need help quitting, ask your doctor. Quitting will help your lungs heal faster.  Use an inhaler, cool mist vaporizer, or humidifier as told by your doctor.  Keep all follow-up visits as told by your doctor. This is important. How is this prevented? To lower your risk of getting this condition again:  Wash your hands often with soap and water. If you cannot use soap and water, use hand sanitizer.  Avoid contact with people who have cold symptoms.  Try not to touch your hands to your mouth, nose, or eyes.  Make sure to get the flu shot every year.  Contact a doctor if:  Your symptoms do not get better in 2 weeks. Get help right away if:  You cough up blood.  You have chest pain.  You have very bad shortness of breath.  You become dehydrated.  You faint  (pass out) or keep feeling like you are going to pass out.  You keep throwing up (vomiting).  You have a very bad headache.  Your fever or chills gets worse. This information is not intended to replace advice given to you by your health care provider. Make sure you discuss any questions you have with your health care provider. Document Released: 09/30/2007 Document Revised: 11/20/2015 Document Reviewed: 10/02/2015 Elsevier Interactive Patient Education  2017 [employer].

## 2016-12-25 NOTE — Progress Notes (Signed)
Careteam: Patient Care Team: Lauree Chandler, NP as PCP - General (Geriatric Medicine) Mcarthur Rossetti, MD as Attending Physician (Orthopedic Surgery) Jacelyn Pi, MD as Consulting Physician (Endocrinology)  Advanced Directive information Does Patient Have a Medical Advance Directive?: No  Allergies  Allergen Reactions  . Cortizone-10 [Hydrocortisone] Hives  . Synthroid [Levothyroxine Sodium] Anaphylaxis    Internal shaking   . Azithromycin Hives  . Betadine [Povidone Iodine]     Aspiration   . Calcium Channel Blockers     Asthma  . Codeine     Flu-like symptoms   . Duloxetine Itching and Nausea Only    Dry mouth  . Fluoride Preparations Other (See Comments)    Blisters  . Iodine Other (See Comments)    Aspiration   . Levaquin [Levofloxacin In D5w]   . Lyrica [Pregabalin] Other (See Comments)    Make her very mean and angery  . Novocain [Procaine] Other (See Comments)    Heart Palpations  . Other     Beans,carbonation, dust and mold,floride,fried foods, household cleaner, melon,mushrooms,peppers,preservatives,rasisns,spicey,spinach,trees,milk products,shellfish  . Peanuts [Peanut Oil] Swelling    **Allergy to ALL NUTS** Face Swelling   . Penicillins   . Pollen Extract Other (See Comments)    Hemorrhage   . Prozac [Fluoxetine Hcl]   . Shellfish Allergy Other (See Comments)    Death  . Wellbutrin [Bupropion] Hives    After 1 pill fist size bumps appeared  . Lanolin Rash    Chief Complaint  Patient presents with  . Acute Visit    Pt is being seen due to nasal/chest congestion, fatigue, body aches, diarrhea.      HPI: Patient is a 66 y.o. female seen in the office today due to congestion for 2 weeks. Felt like she got this at [facility] when she was there she touched her face after sitting in a waiting room.  Several people who she have seeing said it will last 3 weeks.   chest and head congestion.  Cough- productive with increase sputum production.  Yellowish clear sputum. Monday got better than Tuesday it was much worse.  A few days of fever that goes and comes.  Having diarrhea which goes and comes and well- not much of an issue- was constipation so she ate ice cream which always makes her have diarrhea.   Only thing she has taken was benadryl.   Pt is a smoker Review of Systems:  Review of Systems  Constitutional: Positive for chills, fever and malaise/fatigue. Negative for weight loss.  HENT: Positive for congestion, sinus pain and sore throat. Negative for tinnitus.   Respiratory: Positive for cough, sputum production and shortness of breath. Negative for wheezing.   Cardiovascular: Negative for chest pain, palpitations and leg swelling.  Gastrointestinal: Positive for diarrhea (was constipated but made diet changes to make her have more stools) and nausea (during vertigo). Negative for abdominal pain, constipation, heartburn and vomiting.  Skin: Negative.   Neurological: Positive for dizziness (better) and weakness. Negative for headaches.    Past Medical History:  Diagnosis Date  . Abdominal pain, epigastric   . Abdominal pain, epigastric   . Abdominal pain, right lower quadrant   . Abnormal weight gain   . Abnormality of gait   . Anxiety state, unspecified   . Asymptomatic varicose veins   . Blood vessel replaced by other means   . Cramp of limb   . Disorder of bone and cartilage, unspecified   . Dizziness and  giddiness   . Edema   . Encounter for long-term (current) use of other medications   . Herpes zoster without mention of complication   . Herpes zoster without mention of complication   . Hypersomnia, unspecified   . Insomnia, unspecified   . Long term (current) use of anticoagulants   . Lumbago   . Lumbago   . Muscle weakness (generalized)   . Myalgia and myositis, unspecified   . Obesity, unspecified   . Optic neuritis   . Other abnormal blood chemistry   . Other and unspecified hyperlipidemia   .  Other cataract   . Pain in joint, site unspecified   . Pain in limb   . Pain in limb   . Palpitations   . Phlebitis and thrombophlebitis of lower extremities, unspecified   . Phlebitis and thrombophlebitis of other deep vessels of lower extremities   . Rash and other nonspecific skin eruption   . Spasm of muscle   . Syncope and collapse   . Tobacco use disorder   . Unspecified essential hypertension   . Unspecified hypothyroidism   . Unspecified sinusitis (chronic)   . Unspecified tinnitus    Past Surgical History:  Procedure Laterality Date  . [place] IMPLANT  2008   DR DICKERSON  . HIP SURGERY LEFT  03/2010   DR [facility]  . HIP SURGERY RIGHT  06/2010   DR [facility]  . LEFT SHOULDER  2012   DR [facility]   . LOWER BACK  05/26/2007   DR JEFF BEANE  . RIGHT BREAST BIOPSY  1986  . RIGHT KNEE  1971   Social History:   reports that she has been smoking Cigarettes.  She has been smoking about 1.00 pack per day. She has never used smokeless tobacco. She reports that she drinks about 0.6 oz of alcohol per week . She reports that she does not use drugs.  Family History  Problem Relation Age of Onset  . Stroke Mother   . Kidney disease Father   . Stroke Brother     Medications: Patient's Medications  New Prescriptions   No medications on file  Previous Medications   ACETAMINOPHEN (TYLENOL) 650 MG CR TABLET    Take 650 mg by mouth every 8 (eight) hours as needed for pain (Takes 1-2 x weekly). Take one to two tablets every 8 hours as needed   ASPIRIN 325 MG TABLET    Take 325 mg by mouth daily.    BIOTIN 5000 MCG TABS    Take by mouth daily.   CHOLECALCIFEROL (VITAMIN D-3 PO)    Take 1 tablet by mouth daily.   CLOBETASOL OINTMENT (TEMOVATE) 0.05 %    Apply 1 application topically. Apply once daily   EPINEPHRINE 0.3 MG/0.3 ML IJ SOAJ INJECTION    Inject 0.3 mLs (0.3 mg total) into the muscle as needed.   GABAPENTIN (NEURONTIN) 100 MG CAPSULE    Take 3  capsules (300 mg total) by mouth at bedtime.   LIDOCAINE (LMX) 4 % CREAM    Apply 1 application topically as needed.   POLYETHYL GLYCOL-PROPYL GLYCOL (SYSTANE PRESERVATIVE FREE OP)    Apply to eye.   TIROSINT 75 MCG CAPS    TK 1 C PO QD  Modified Medications   No medications on file  Discontinued Medications   No medications on file     Physical Exam:  Vitals:   12/25/16 1119  BP: 124/78  Pulse: 75  Resp:  17  Temp: 97.7 F (36.5 C)  TempSrc: Oral  SpO2: 96%  Weight: 179 lb (81.2 kg)  Height: 5\' 7"  (1.702 m)   Body mass index is 28.04 kg/m.  Physical Exam  Constitutional: She is oriented to person, place, and time. She appears well-developed and well-nourished. No distress.  HENT:  Head: Normocephalic and atraumatic.  Right Ear: External ear normal.  Left Ear: External ear normal.  Nose: Nose normal.  Mouth/Throat: Oropharynx is clear and moist. No oropharyngeal exudate.  Eyes: Pupils are equal, round, and reactive to light. Conjunctivae and EOM are normal.  Neck: Normal range of motion. Neck supple. No JVD present. No tracheal deviation present. No thyromegaly present.  Cardiovascular: Normal rate, regular rhythm and normal heart sounds.   Pulmonary/Chest: Effort normal and breath sounds normal. No respiratory distress. She has no wheezes.  Abdominal: Soft. Bowel sounds are normal.  Musculoskeletal: She exhibits no edema or tenderness.  Lymphadenopathy:    She has no cervical adenopathy.  Neurological: She is alert and oriented to person, place, and time.  Skin: Skin is warm and dry. She is not diaphoretic.  Psychiatric: She has a normal mood and affect.    Labs reviewed: Basic Metabolic Panel:  Recent Labs  11/25/16 1319  NA 140  K 5.0  CL 103  CO2 24  GLUCOSE 90  BUN 11  CREATININE 0.72  CALCIUM 9.4  TSH 1.000   Liver Function Tests:  Recent Labs  11/25/16 1319  AST 14  ALT 6  ALKPHOS 85  BILITOT 0.5  PROT 6.5  ALBUMIN 4.5   No results  for input(s): LIPASE, AMYLASE in the last 8760 hours. No results for input(s): AMMONIA in the last 8760 hours. CBC:  Recent Labs  11/25/16 1319  WBC 7.8  NEUTROABS 4.8  HGB 14.8  HCT 44.2  MCV 84  PLT 273   Lipid Panel:  Recent Labs  11/25/16 1319  CHOL 221*  HDL 61  LDLCALC 139*  TRIG 105  CHOLHDL 3.6   TSH:  Recent Labs  11/25/16 1319  TSH 1.000   A1C: No results found for: HGBA1C   Assessment/Plan 1. Upper respiratory tract infection, unspecified type Feels better today but was worse yesterday.  -To use Claritin 10 mg by mouth daily instead of benadryl  To use mucinex DM by mouth twice daily with full glass of water. Increase water intake throughout the day Tylenol 325 mg 2 tablets- 650 mg by mouth every 6 hours as needed for body aches- do not take more than 3000 mg in 24 hours To use doxycycline if symptoms or worsening or fail to improve  - doxycycline (VIBRA-TABS) 100 MG tablet; Take 1 tablet (100 mg total) by mouth 2 (two) times daily.  Dispense: 14 tablet; Refill: 0  Next appt: as schedule and as needed before. Return precautions discussed  Carlos American. Harle Battiest  [facility] [phone] 8 am - 5 pm) [phone] (after hours)

## 2017-01-05 ENCOUNTER — Ambulatory Visit: Payer: PPO | Admitting: Nurse Practitioner

## 2017-01-06 DIAGNOSIS — Z23 Encounter for immunization: Secondary | ICD-10-CM | POA: Diagnosis not present

## 2017-01-06 DIAGNOSIS — I781 Nevus, non-neoplastic: Secondary | ICD-10-CM | POA: Diagnosis not present

## 2017-01-06 DIAGNOSIS — L821 Other seborrheic keratosis: Secondary | ICD-10-CM | POA: Diagnosis not present

## 2017-01-06 DIAGNOSIS — D1801 Hemangioma of skin and subcutaneous tissue: Secondary | ICD-10-CM | POA: Diagnosis not present

## 2017-01-06 DIAGNOSIS — D225 Melanocytic nevi of trunk: Secondary | ICD-10-CM | POA: Diagnosis not present

## 2017-01-06 DIAGNOSIS — L814 Other melanin hyperpigmentation: Secondary | ICD-10-CM | POA: Diagnosis not present

## 2017-01-06 DIAGNOSIS — Z86018 Personal history of other benign neoplasm: Secondary | ICD-10-CM | POA: Diagnosis not present

## 2017-01-07 DIAGNOSIS — Z01419 Encounter for gynecological examination (general) (routine) without abnormal findings: Secondary | ICD-10-CM | POA: Diagnosis not present

## 2017-01-08 ENCOUNTER — Encounter: Payer: Self-pay | Admitting: *Deleted

## 2017-01-08 LAB — HM PAP SMEAR

## 2017-02-05 ENCOUNTER — Telehealth: Payer: Self-pay | Admitting: Nurse Practitioner

## 2017-02-05 NOTE — Telephone Encounter (Signed)
Left message asking pt to call and schedule her AWV-S that's due in November. VDM (DD)

## 2017-02-26 ENCOUNTER — Telehealth: Payer: Self-pay | Admitting: *Deleted

## 2017-02-26 DIAGNOSIS — L03011 Cellulitis of right finger: Secondary | ICD-10-CM | POA: Diagnosis not present

## 2017-02-26 NOTE — Telephone Encounter (Signed)
Pt calling asking if she needs to be seen by urgent care, she got stuck in her joint on her hand by a thorny bush and now her joint is hurting and she can't bend her finger. Since we don't have any providers in the office I advised she may need to be seen by urgent care. Please acknowledged

## 2017-04-01 ENCOUNTER — Telehealth: Payer: Self-pay
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
## 2017-04-02 DIAGNOSIS — S40021A Contusion of right upper arm, initial encounter: Secondary | ICD-10-CM | POA: Diagnosis not present

## 2017-04-22 IMAGING — CR DG CERVICAL SPINE COMPLETE 4+V
5 series · 5 of 5 positions shown · non-contrast
Comparison: None.

CLINICAL DATA: Neck pain with bilateral arm pain. Initial
encounter.

EXAM:
CERVICAL SPINE - COMPLETE 4+ VIEW

[view not recorded (1 of 5)]
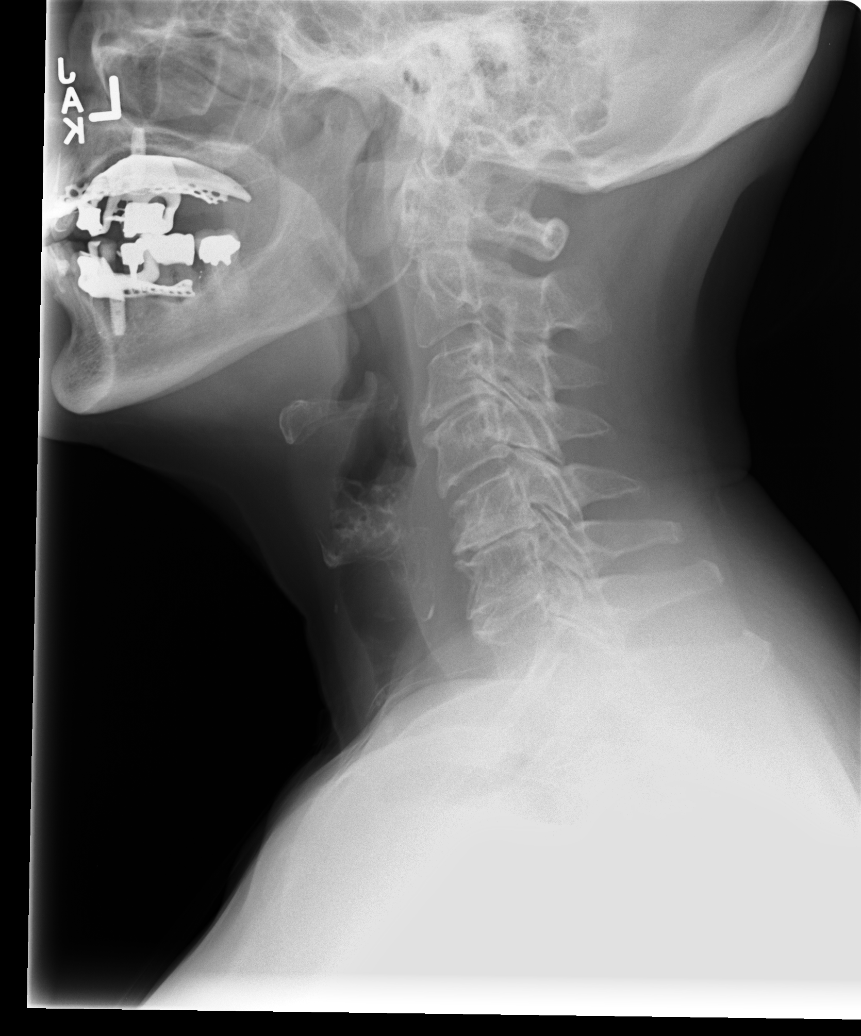

[view not recorded (2 of 5)]
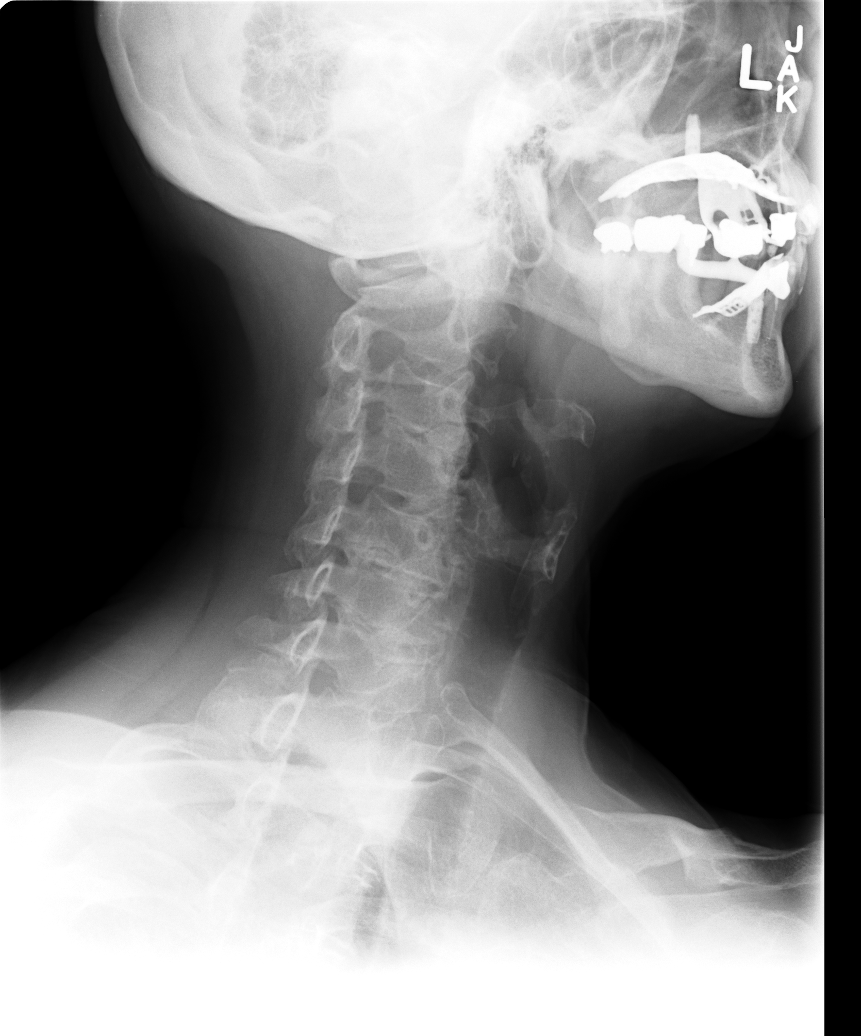

[view not recorded (3 of 5)]
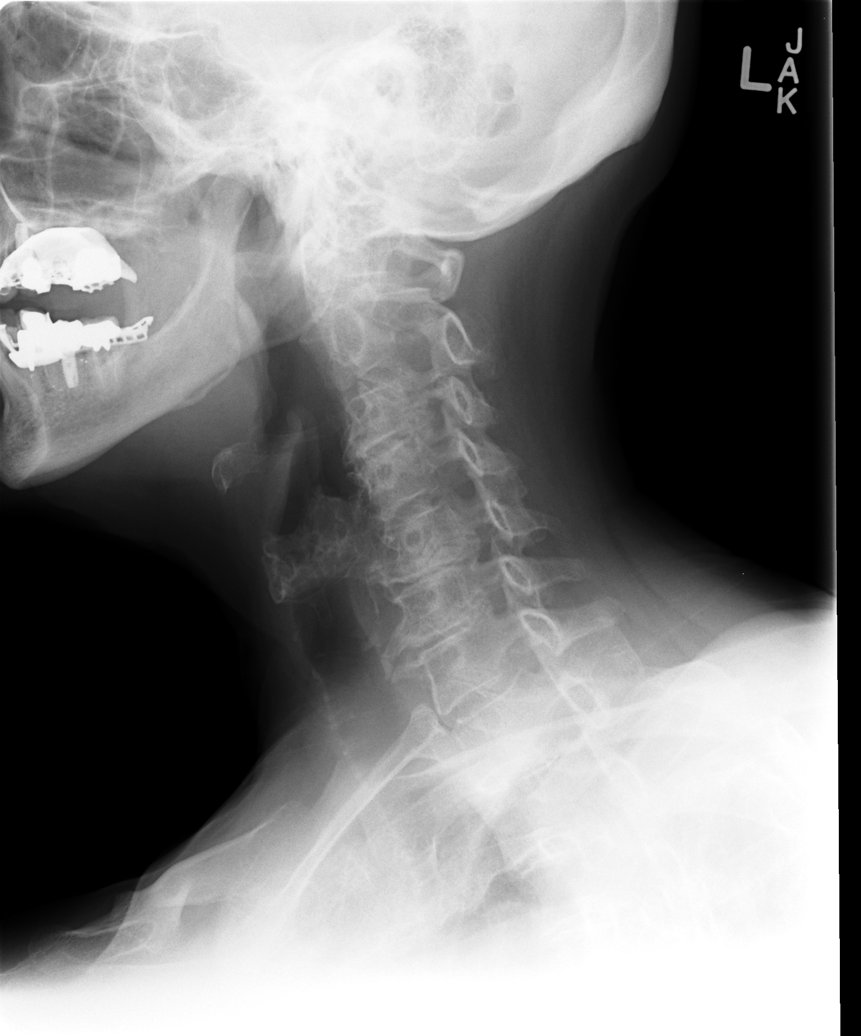

[view not recorded (4 of 5)]
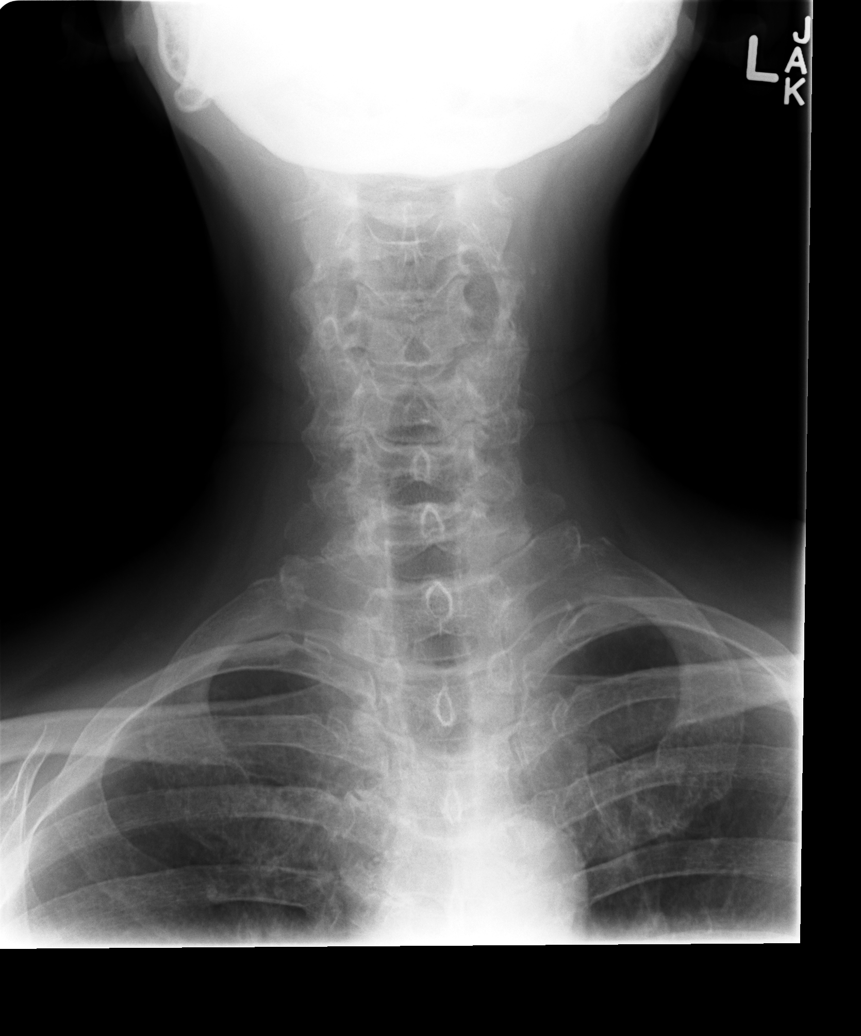

[view not recorded (5 of 5)]
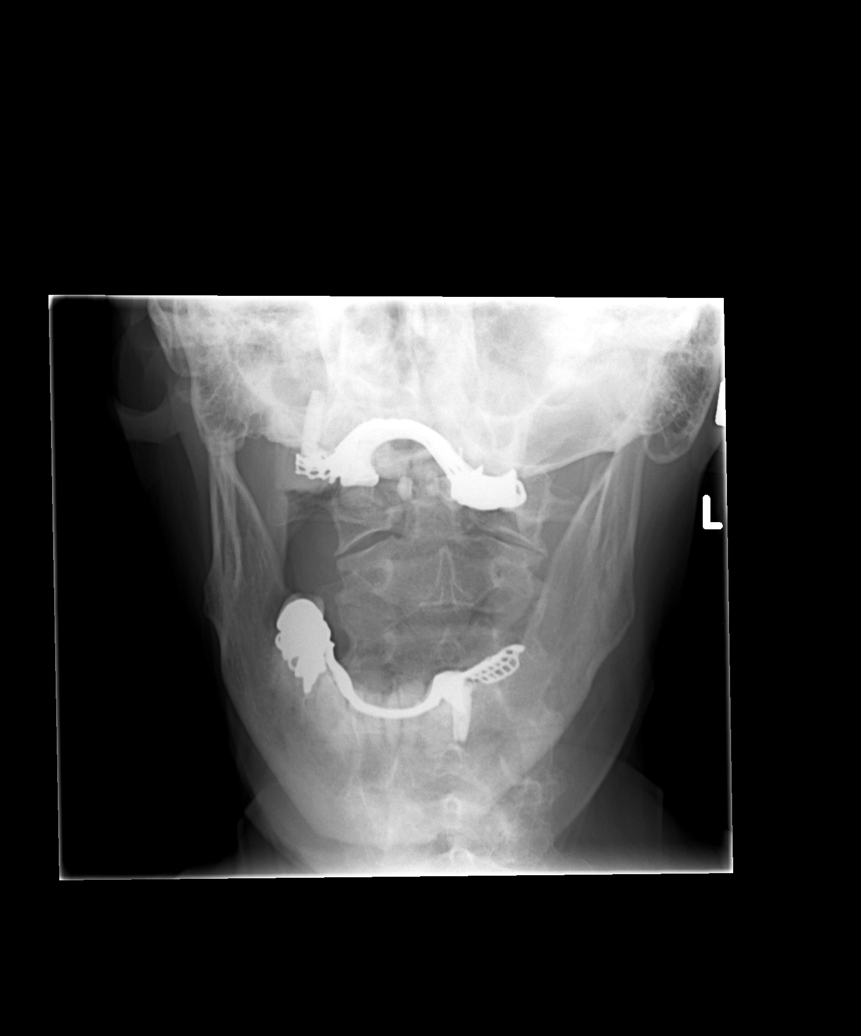

[5 of 5 positions shown; findings below may reference images not displayed]

FINDINGS: No evidence fracture. No subluxation. Loss of intervertebral disc
height is seen at C3-4, C5-6 and C6-7. Facets are well aligned
bilaterally. Oblique films show right foraminal encroachment at C5-6
and C6-7 secondary to uncinate hypertrophy. No prevertebral soft
tissue swelling. Straightening of the normal cervical lordosis is
evident.
IMPRESSION: Degenerative changes without evidence for cervical spine fracture.

Loss of cervical lordosis. This can be related to patient
positioning, muscle spasm or soft tissue injury.

## 2017-05-13 ENCOUNTER — Telehealth: Payer: Self-pay

## 2017-05-13 NOTE — Telephone Encounter (Signed)
Called patient to try to schedule AWV in the office. No answer-left voicemail to call back.    

## 2017-05-17 ENCOUNTER — Telehealth: Payer: Self-pay

## 2017-05-17 NOTE — Telephone Encounter (Signed)
Called patient to try to schedule AWV in the office. No answer-left voicemail to call back.    

## 2017-05-25 DIAGNOSIS — R69 Illness, unspecified: Secondary | ICD-10-CM | POA: Diagnosis not present

## 2017-05-31 NOTE — Telephone Encounter (Signed)
Handled..cdavis  °

## 2017-06-01 ENCOUNTER — Telehealth: Payer: Self-pay | Admitting: Nurse Practitioner

## 2017-06-01 DIAGNOSIS — L738 Other specified follicular disorders: Secondary | ICD-10-CM | POA: Diagnosis not present

## 2017-06-01 DIAGNOSIS — L821 Other seborrheic keratosis: Secondary | ICD-10-CM | POA: Diagnosis not present

## 2017-06-01 DIAGNOSIS — Z23 Encounter for immunization: Secondary | ICD-10-CM | POA: Diagnosis not present

## 2017-06-01 DIAGNOSIS — L309 Dermatitis, unspecified: Secondary | ICD-10-CM | POA: Diagnosis not present

## 2017-06-01 DIAGNOSIS — L219 Seborrheic dermatitis, unspecified: Secondary | ICD-10-CM | POA: Diagnosis not present

## 2017-06-01 NOTE — Telephone Encounter (Signed)
06/01/17 Left msg asking pt to confirm this AWV/CPE appt. VDM (DD)

## 2017-06-07 DIAGNOSIS — E039 Hypothyroidism, unspecified: Secondary | ICD-10-CM | POA: Diagnosis not present

## 2017-06-08 DIAGNOSIS — R69 Illness, unspecified: Secondary | ICD-10-CM | POA: Diagnosis not present

## 2017-06-10 DIAGNOSIS — R635 Abnormal weight gain: Secondary | ICD-10-CM | POA: Diagnosis not present

## 2017-06-10 DIAGNOSIS — E049 Nontoxic goiter, unspecified: Secondary | ICD-10-CM | POA: Diagnosis not present

## 2017-06-10 DIAGNOSIS — E039 Hypothyroidism, unspecified: Secondary | ICD-10-CM | POA: Diagnosis not present

## 2017-06-14 ENCOUNTER — Ambulatory Visit: Payer: PPO

## 2017-06-14 ENCOUNTER — Encounter: Payer: PPO | Admitting: Nurse Practitioner

## 2017-06-21 NOTE — Telephone Encounter (Signed)
Left message asking the pt to call me at [phone] to scheduled AWV/CPE. VDM (DD)

## 2017-06-23 ENCOUNTER — Ambulatory Visit: Payer: PPO

## 2017-06-24 ENCOUNTER — Ambulatory Visit (INDEPENDENT_AMBULATORY_CARE_PROVIDER_SITE_OTHER): Payer: Medicare HMO

## 2017-06-24 VITALS — BP 144/70 | HR 83 | Temp 98.0°F | Ht 68.0 in | Wt 172.0 lb

## 2017-06-24 DIAGNOSIS — Z0000 Encounter for general adult medical examination without abnormal findings: Secondary | ICD-10-CM | POA: Diagnosis not present

## 2017-06-24 DIAGNOSIS — Z658 Other specified problems related to psychosocial circumstances: Secondary | ICD-10-CM

## 2017-06-24 NOTE — Progress Notes (Signed)
Subjective:   [name] is a 67 y.o. female who presents for Medicare Annual (Subsequent) preventive examination.  Last AWV-02/26/2016    Objective:     Vitals: There were no vitals taken for this visit.  There is no height or weight on file to calculate BMI.  Advanced Directives 12/25/2016 07/01/2016 02/26/2016 12/31/2015 08/07/2015 07/02/2015 06/04/2015  Does Patient Have a Medical Advance Directive? No Yes Yes Yes No Yes Yes  Type of Advance Directive - Clinical cytogeneticist of [employer] Power of [place];Living will - Healthcare Power of [place]  Does patient want to make changes to medical advance directive? - - - - - No - Patient declined No - Patient declined  Copy of [place] in Chart? - Yes No - copy requested No - copy requested - No - copy requested No - copy requested  Would patient like information on creating a medical advance directive? - - - - No - patient declined information - -    Tobacco Social History   Tobacco Use  Smoking Status Heavy Tobacco Smoker  . Packs/day: 1.00  . Types: Cigarettes  Smokeless Tobacco Never Used  Tobacco Comment   one pack Daily Maybe more.     Ready to quit: Not Answered Counseling given: Not Answered Comment: one pack Daily Maybe more.   Clinical Intake:                       Past Medical History:  Diagnosis Date  . Abdominal pain, epigastric   . Abdominal pain, epigastric   . Abdominal pain, right lower quadrant   . Abnormal weight gain   . Abnormality of gait   . Anxiety state, unspecified   . Asymptomatic varicose veins   . Blood vessel replaced by other means   . Cramp of limb   . Disorder of bone and cartilage, unspecified   . Dizziness and giddiness   . Edema   . Encounter for long-term (current) use of other medications   . Herpes zoster without mention of complication   . Herpes zoster without mention of  complication   . Hypersomnia, unspecified   . Insomnia, unspecified   . Long term (current) use of anticoagulants   . Lumbago   . Lumbago   . Muscle weakness (generalized)   . Myalgia and myositis, unspecified   . Obesity, unspecified   . Optic neuritis   . Other abnormal blood chemistry   . Other and unspecified hyperlipidemia   . Other cataract   . Pain in joint, site unspecified   . Pain in limb   . Pain in limb   . Palpitations   . Phlebitis and thrombophlebitis of lower extremities, unspecified   . Phlebitis and thrombophlebitis of other deep vessels of lower extremities   . Rash and other nonspecific skin eruption   . Spasm of muscle   . Syncope and collapse   . Tobacco use disorder   . Unspecified essential hypertension   . Unspecified hypothyroidism   . Unspecified sinusitis (chronic)   . Unspecified tinnitus    Past Surgical History:  Procedure Laterality Date  . [place] IMPLANT  2008   DR DICKERSON  . HIP SURGERY LEFT  03/2010   DR [facility]  . HIP SURGERY RIGHT  06/2010   DR [facility]  . LEFT SHOULDER  2012   DR [facility]   .  LOWER BACK  05/26/2007   DR JEFF BEANE  . RIGHT BREAST BIOPSY  1986  . RIGHT KNEE  1971   Family History  Problem Relation Age of Onset  . Stroke Mother   . Kidney disease Father   . Stroke Brother    Social History   Socioeconomic History  . Marital status: Married    Spouse name: [name]  . Number of children: 0  . Years of education: college  . Highest education level: Not on file  Social Needs  . Financial resource strain: Not on file  . Food insecurity - worry: Not on file  . Food insecurity - inability: Not on file  . Transportation needs - medical: Not on file  . Transportation needs - non-medical: Not on file  Occupational History    Employer: [employer]    Comment: answers phones  Tobacco Use  . Smoking status: Heavy Tobacco Smoker    Packs/day: 1.00    Types: Cigarettes  .  Smokeless tobacco: Never Used  . Tobacco comment: one pack Daily Maybe more.  Substance and Sexual Activity  . Alcohol use: Yes    Alcohol/week: 0.6 oz    Types: 1 Shots of liquor per week  . Drug use: No  . Sexual activity: Not Currently    Partners: Male    Comment: husband  Other Topics Concern  . Not on file  Social History Narrative   Patient  Lives at home wit her husband [name]) . Patient still works.college education.   Right handed.   Caffiene -None    Outpatient Encounter Medications as of 06/24/2017  Medication Sig  . acetaminophen (TYLENOL) 650 MG CR tablet Take 650 mg by mouth every 8 (eight) hours as needed for pain (Takes 1-2 x weekly). Take one to two tablets every 8 hours as needed  . aspirin 325 MG tablet Take 325 mg by mouth daily.   . Biotin 5000 MCG TABS Take by mouth daily.  . Cholecalciferol (VITAMIN D-3 PO) Take 1 tablet by mouth daily.  . clobetasol ointment (TEMOVATE) 1.09 % Apply 1 application topically. Apply once daily  . doxycycline (VIBRA-TABS) 100 MG tablet Take 1 tablet (100 mg total) by mouth 2 (two) times daily.  Marland Kitchen EPINEPHrine 0.3 mg/0.3 mL IJ SOAJ injection Inject 0.3 mLs (0.3 mg total) into the muscle as needed.  . gabapentin (NEURONTIN) 100 MG capsule Take 3 capsules (300 mg total) by mouth at bedtime.  . lidocaine (LMX) 4 % cream Apply 1 application topically as needed.  Vladimir Faster Glycol-Propyl Glycol (SYSTANE PRESERVATIVE FREE OP) Apply to eye.  Marland Kitchen TIROSINT 75 MCG CAPS TK 1 C PO QD   No facility-administered encounter medications on file as of 06/24/2017.     Activities of Daily Living No flowsheet data found.  Patient Care Team: Lauree Chandler, NP as PCP - General (Geriatric Medicine) Mcarthur Rossetti, MD as Attending Physician (Orthopedic Surgery) Jacelyn Pi, MD as Consulting Physician (Endocrinology) Jerelyn Charles, MD as Consulting Physician (Gynecology)    Assessment:   This is a routine wellness examination for  [name].  Exercise Activities and Dietary recommendations    Goals    None      Fall Risk Fall Risk  12/25/2016 02/26/2016 07/02/2015 06/04/2015 05/01/2015  Falls in the past year? No No No No No  Number falls in past yr: - - - - -  Injury with Fall? - - - - -  Comment - - - - -  Is the patient's home free of loose throw rugs in walkways, pet beds, electrical cords, etc?   yes      Grab bars in the bathroom? yes      Handrails on the stairs?   yes      Adequate lighting?   yes  Depression Screen PHQ 2/9 Scores 02/26/2016 11/16/2012 08/31/2012  PHQ - 2 Score 1 0 0     Cognitive Function MMSE - Mini Mental State Exam 02/26/2016  Orientation to time 5  Orientation to Place 5  Registration 3  Attention/ Calculation 5  Recall 3  Language- name 2 objects 2  Language- repeat 1  Language- follow 3 step command 3  Language- read & follow direction 1  Write a sentence 1  Copy design 1  Total score 30        Immunization History  Administered Date(s) Administered  . Influenza,inj,Quad PF,6+ Mos 12/31/2015  . Influenza-Unspecified 01/24/2013, 01/02/2014, 02/18/2015  . Pneumococcal Conjugate-13 12/31/2015  . Pneumococcal Polysaccharide-23 04/27/2012  . Td 05/25/1988  . Tdap 02/03/2014  . Zoster 04/28/2010    Qualifies for Shingles Vaccine? Yes, educated and declined for now  Screening Tests Health Maintenance  Topic Date Due  . MAMMOGRAM  01/03/2000  . COLONOSCOPY  01/03/2000  . DEXA SCAN  01/03/2015  . INFLUENZA VACCINE  11/25/2016  . PNA vac Low Risk Adult (2 of 2 - PPSV23) 04/27/2017  . PAP SMEAR  01/09/2019  . TETANUS/TDAP  02/04/2024  . Hepatitis C Screening  Completed    Cancer Screenings: Lung: Low Dose CT Chest recommended if Age 48-80 years, 30 pack-year currently smoking OR have quit w/in 15years. Patient does not qualify. Breast:  Up to date on Mammogram? No, declined Up to date of Bone Density/Dexa? No, declined Colorectal: due, cologuard  signed  Additional Screenings:  Hepatitis C Screening: declined PNA 23 due-pt does not feel her best today and asked to wait     Plan:    I have personally reviewed and addressed the Medicare Annual Wellness questionnaire and have noted the following in the patient's chart:  A. Medical and social history B. Use of alcohol, tobacco or illicit drugs  C. Current medications and supplements D. Functional ability and status E.  Nutritional status F.  Physical activity G. Advance directives H. List of other physicians I.  Hospitalizations, surgeries, and ER visits in previous 12 months J.  [place] to include hearing, vision, cognitive, depression L. Referrals and appointments - none  In addition, I have reviewed and discussed with patient certain preventive protocols, quality metrics, and best practice recommendations. A written personalized care plan for preventive services as well as general preventive health recommendations were provided to patient.  See attached scanned questionnaire for additional information.   Signed,   Tyson Dense, RN Nurse Health Advisor  Patient Concerns: Hervey Ard, heated pain in left leg intermittently

## 2017-06-24 NOTE — Patient Instructions (Signed)
[name] , Thank you for taking time to come for your Medicare Wellness Visit. I appreciate your ongoing commitment to your health goals. Please review the following plan we discussed and let me know if I can assist you in the future.   Screening recommendations/referrals: Colonoscopy due, cologuard information signed today Mammogram due, declined Bone Density due Recommended yearly ophthalmology/optometry visit for glaucoma screening and checkup Recommended yearly dental visit for hygiene and checkup  Vaccinations: Influenza vaccine up to date, due 2019 fall season Pneumococcal vaccine 23 due Tdap vaccine up to date, due 02/04/2024 Shingles vaccine due    Advanced directives: in chart  Conditions/risks identified: none  Next appointment: None Scheduled   Preventive Care 16 Years and Older, Female Preventive care refers to lifestyle choices and visits with your health care provider that can promote health and wellness. What does preventive care include?  A yearly physical exam. This is also called an annual well check.  Dental exams once or twice a year.  Routine eye exams. Ask your health care provider how often you should have your eyes checked.  Personal lifestyle choices, including:  Daily care of your teeth and gums.  Regular physical activity.  Eating a healthy diet.  Avoiding tobacco and drug use.  Limiting alcohol use.  Practicing safe sex.  Taking low-dose aspirin every day.  Taking vitamin and mineral supplements as recommended by your health care provider. What happens during an annual well check? The services and screenings done by your health care provider during your annual well check will depend on your age, overall health, lifestyle risk factors, and family history of disease. Counseling  Your health care provider may ask you questions about your:  Alcohol use.  Tobacco use.  Drug use.  Emotional well-being.  Home and relationship  well-being.  Sexual activity.  Eating habits.  History of falls.  Memory and ability to understand (cognition).  Work and work Statistician.  Reproductive health. Screening  You may have the following tests or measurements:  Height, weight, and BMI.  Blood pressure.  Lipid and cholesterol levels. These may be checked every 5 years, or more frequently if you are over 57 years old.  Skin check.  Lung cancer screening. You may have this screening every year starting at age 88 if you have a 30-pack-year history of smoking and currently smoke or have quit within the past 15 years.  Fecal occult blood test (FOBT) of the stool. You may have this test every year starting at age 32.  Flexible sigmoidoscopy or colonoscopy. You may have a sigmoidoscopy every 5 years or a colonoscopy every 10 years starting at age 36.  Hepatitis C blood test.  Hepatitis B blood test.  Sexually transmitted disease (STD) testing.  Diabetes screening. This is done by checking your blood sugar (glucose) after you have not eaten for a while (fasting). You may have this done every 1-3 years.  Bone density scan. This is done to screen for osteoporosis. You may have this done starting at age 23.  Mammogram. This may be done every 1-2 years. Talk to your health care provider about how often you should have regular mammograms. Talk with your health care provider about your test results, treatment options, and if necessary, the need for more tests. Vaccines  Your health care provider may recommend certain vaccines, such as:  Influenza vaccine. This is recommended every year.  Tetanus, diphtheria, and acellular pertussis (Tdap, Td) vaccine. You may need a Td booster every 10  years.  Zoster vaccine. You may need this after age 92.  Pneumococcal 13-valent conjugate (PCV13) vaccine. One dose is recommended after age 61.  Pneumococcal polysaccharide (PPSV23) vaccine. One dose is recommended after age  72. Talk to your health care provider about which screenings and vaccines you need and how often you need them. This information is not intended to replace advice given to you by your health care provider. Make sure you discuss any questions you have with your health care provider. Document Released: 05/10/2015 Document Revised: 01/01/2016 Document Reviewed: 02/12/2015 Elsevier Interactive Patient Education  2017 [place] Prevention in the Home Falls can cause injuries. They can happen to people of all ages. There are many things you can do to make your home safe and to help prevent falls. What can I do on the outside of my home?  Regularly fix the edges of walkways and driveways and fix any cracks.  Remove anything that might make you trip as you walk through a door, such as a raised step or threshold.  Trim any bushes or trees on the path to your home.  Use bright outdoor lighting.  Clear any walking paths of anything that might make someone trip, such as rocks or tools.  Regularly check to see if handrails are loose or broken. Make sure that both sides of any steps have handrails.  Any raised decks and porches should have guardrails on the edges.  Have any leaves, snow, or ice cleared regularly.  Use sand or salt on walking paths during winter.  Clean up any spills in your garage right away. This includes oil or grease spills. What can I do in the bathroom?  Use night lights.  Install grab bars by the toilet and in the tub and shower. Do not use towel bars as grab bars.  Use non-skid mats or decals in the tub or shower.  If you need to sit down in the shower, use a plastic, non-slip stool.  Keep the floor dry. Clean up any water that spills on the floor as soon as it happens.  Remove soap buildup in the tub or shower regularly.  Attach bath mats securely with double-sided non-slip rug tape.  Do not have throw rugs and other things on the floor that can make  you trip. What can I do in the bedroom?  Use night lights.  Make sure that you have a light by your bed that is easy to reach.  Do not use any sheets or blankets that are too big for your bed. They should not hang down onto the floor.  Have a firm chair that has side arms. You can use this for support while you get dressed.  Do not have throw rugs and other things on the floor that can make you trip. What can I do in the kitchen?  Clean up any spills right away.  Avoid walking on wet floors.  Keep items that you use a lot in easy-to-reach places.  If you need to reach something above you, use a strong step stool that has a grab bar.  Keep electrical cords out of the way.  Do not use floor polish or wax that makes floors slippery. If you must use wax, use non-skid floor wax.  Do not have throw rugs and other things on the floor that can make you trip. What can I do with my stairs?  Do not leave any items on the stairs.  Make sure that  there are handrails on both sides of the stairs and use them. Fix handrails that are broken or loose. Make sure that handrails are as long as the stairways.  Check any carpeting to make sure that it is firmly attached to the stairs. Fix any carpet that is loose or worn.  Avoid having throw rugs at the top or bottom of the stairs. If you do have throw rugs, attach them to the floor with carpet tape.  Make sure that you have a light switch at the top of the stairs and the bottom of the stairs. If you do not have them, ask someone to add them for you. What else can I do to help prevent falls?  Wear shoes that:  Do not have high heels.  Have rubber bottoms.  Are comfortable and fit you well.  Are closed at the toe. Do not wear sandals.  If you use a stepladder:  Make sure that it is fully opened. Do not climb a closed stepladder.  Make sure that both sides of the stepladder are locked into place.  Ask someone to hold it for you, if  possible.  Clearly mark and make sure that you can see:  Any grab bars or handrails.  First and last steps.  Where the edge of each step is.  Use tools that help you move around (mobility aids) if they are needed. These include:  Canes.  Walkers.  Scooters.  Crutches.  Turn on the lights when you go into a dark area. Replace any light bulbs as soon as they burn out.  Set up your furniture so you have a clear path. Avoid moving your furniture around.  If any of your floors are uneven, fix them.  If there are any pets around you, be aware of where they are.  Review your medicines with your doctor. Some medicines can make you feel dizzy. This can increase your chance of falling. Ask your doctor what other things that you can do to help prevent falls. This information is not intended to replace advice given to you by your health care provider. Make sure you discuss any questions you have with your health care provider. Document Released: 02/07/2009 Document Revised: 09/19/2015 Document Reviewed: 05/18/2014 Elsevier Interactive Patient Education  2017 [employer].

## 2017-07-06 ENCOUNTER — Encounter: Payer: PPO | Admitting: Nurse Practitioner

## 2017-07-06 ENCOUNTER — Ambulatory Visit: Payer: PPO

## 2017-07-12 ENCOUNTER — Telehealth: Payer: Self-pay | Admitting: *Deleted

## 2017-07-12 NOTE — Telephone Encounter (Signed)
Patient stopped by the office this afternoon she wants to know when she is suppose to f/u again with Dr. Krista Blue?  Or does she need to f/u?  Please advise.

## 2017-07-13 NOTE — Telephone Encounter (Signed)
Returned call to patient - left message letting her know if she is having continued problems or needs prescription refills then she will need to schedule a follow up.  If she is doing well, then she can just call us when she needs [place] again.  Provided our call back number.

## 2017-07-22 DIAGNOSIS — L900 Lichen sclerosus et atrophicus: Secondary | ICD-10-CM | POA: Diagnosis not present

## 2017-08-02 ENCOUNTER — Telehealth: Payer: Self-pay

## 2017-08-02 NOTE — Telephone Encounter (Signed)
Returning patient call. She asked about a follow up concerning the cologuard that was ordered during her AWV on 06/24/2017. I confirmed that the paperwork was faxed and scanned into her chart and gave her the cologuard company number to follow up with them

## 2017-08-10 DIAGNOSIS — E039 Hypothyroidism, unspecified: Secondary | ICD-10-CM | POA: Diagnosis not present

## 2017-08-18 DIAGNOSIS — R69 Illness, unspecified: Secondary | ICD-10-CM | POA: Diagnosis not present

## 2017-08-20 ENCOUNTER — Telehealth: Payer: Self-pay | Admitting: Nurse Practitioner

## 2017-08-20 NOTE — Telephone Encounter (Signed)
Left message asking the pt to call me at [phone] about referral for support group/counseling for loss of a loved one. VDM (DD)

## 2017-08-31 DIAGNOSIS — R69 Illness, unspecified: Secondary | ICD-10-CM | POA: Diagnosis not present

## 2017-09-15 DIAGNOSIS — W57XXXA Bitten or stung by nonvenomous insect and other nonvenomous arthropods, initial encounter: Secondary | ICD-10-CM | POA: Diagnosis not present

## 2017-09-15 DIAGNOSIS — L409 Psoriasis, unspecified: Secondary | ICD-10-CM | POA: Diagnosis not present

## 2017-09-15 DIAGNOSIS — S40861A Insect bite (nonvenomous) of right upper arm, initial encounter: Secondary | ICD-10-CM | POA: Diagnosis not present

## 2017-10-03 DIAGNOSIS — Z1212 Encounter for screening for malignant neoplasm of rectum: Secondary | ICD-10-CM | POA: Diagnosis not present

## 2017-10-03 DIAGNOSIS — Z1211 Encounter for screening for malignant neoplasm of colon: Secondary | ICD-10-CM | POA: Diagnosis not present

## 2017-10-03 LAB — COLOGUARD: Cologuard: NEGATIVE

## 2017-10-15 ENCOUNTER — Encounter: Payer: Self-pay | Admitting: *Deleted

## 2017-10-18 ENCOUNTER — Telehealth: Payer: Self-pay

## 2017-10-18 NOTE — Telephone Encounter (Signed)
I called patient to let her know that her cologuard result is negative.   Pt also needs to re-schedule missed CPE unless she has established with another provider.

## 2017-10-19 NOTE — Telephone Encounter (Signed)
Patient verbalized understanding of the cologuard results.   Patient stated that she could not schedule an appointment today but she would call back when she could.

## 2017-11-25 DIAGNOSIS — R69 Illness, unspecified: Secondary | ICD-10-CM | POA: Diagnosis not present

## 2017-12-09 DIAGNOSIS — L821 Other seborrheic keratosis: Secondary | ICD-10-CM | POA: Diagnosis not present

## 2017-12-09 DIAGNOSIS — W57XXXA Bitten or stung by nonvenomous insect and other nonvenomous arthropods, initial encounter: Secondary | ICD-10-CM | POA: Diagnosis not present

## 2017-12-09 DIAGNOSIS — D225 Melanocytic nevi of trunk: Secondary | ICD-10-CM | POA: Diagnosis not present

## 2017-12-09 DIAGNOSIS — S20461A Insect bite (nonvenomous) of right back wall of thorax, initial encounter: Secondary | ICD-10-CM | POA: Diagnosis not present

## 2017-12-18 DIAGNOSIS — W57XXXA Bitten or stung by nonvenomous insect and other nonvenomous arthropods, initial encounter: Secondary | ICD-10-CM | POA: Diagnosis not present

## 2017-12-18 DIAGNOSIS — L309 Dermatitis, unspecified: Secondary | ICD-10-CM | POA: Diagnosis not present

## 2017-12-31 ENCOUNTER — Ambulatory Visit (INDEPENDENT_AMBULATORY_CARE_PROVIDER_SITE_OTHER): Payer: Medicare HMO | Admitting: Family Medicine

## 2017-12-31 ENCOUNTER — Encounter: Payer: Self-pay | Admitting: Family Medicine

## 2017-12-31 VITALS — BP 120/70 | HR 76 | Temp 98.3°F | Ht 66.0 in | Wt 174.0 lb

## 2017-12-31 DIAGNOSIS — W57XXXA Bitten or stung by nonvenomous insect and other nonvenomous arthropods, initial encounter: Secondary | ICD-10-CM

## 2017-12-31 DIAGNOSIS — F4321 Adjustment disorder with depressed mood: Secondary | ICD-10-CM | POA: Diagnosis not present

## 2017-12-31 DIAGNOSIS — F419 Anxiety disorder, unspecified: Secondary | ICD-10-CM

## 2017-12-31 DIAGNOSIS — S30860A Insect bite (nonvenomous) of lower back and pelvis, initial encounter: Secondary | ICD-10-CM

## 2017-12-31 DIAGNOSIS — R69 Illness, unspecified: Secondary | ICD-10-CM | POA: Diagnosis not present

## 2017-12-31 DIAGNOSIS — I10 Essential (primary) hypertension: Secondary | ICD-10-CM | POA: Diagnosis not present

## 2017-12-31 DIAGNOSIS — M2550 Pain in unspecified joint: Secondary | ICD-10-CM | POA: Diagnosis not present

## 2017-12-31 DIAGNOSIS — S20462A Insect bite (nonvenomous) of left back wall of thorax, initial encounter: Secondary | ICD-10-CM | POA: Insufficient documentation

## 2017-12-31 DIAGNOSIS — E039 Hypothyroidism, unspecified: Secondary | ICD-10-CM

## 2017-12-31 NOTE — Progress Notes (Signed)
Kashawna Manzer Spitler - 67 y.o. female MRN [id number]  Date of birth: January 08, 1951  Subjective Chief Complaint  Patient presents with  . Annual Exam    HPI [name] is a 67 y.o. female with history of low back pain, hypothyroidism, multiple allergies and history of HTN here today to establish with new PCP due to her previous PCP retiring.  She has concern about recent tick bite.  She is followed by endocrinology (Dr. Chalmers Cater) for mgmt of hypothyroidism.    -Tick bite:  Reports tick bite to right flank area a few weeks ago.  Tick removed at urgent care and they recommended that she f/u with PCP for lab testing.  She reports area is very itchy.  She does have some chronic joint pain but notes that this seems more so recently.  She also admits to intermittent chills, but has not checked her temp.  She denies rash.    -HTN: History of HTN, not currently on medications. She does watch her diet and doesn't eat high salt foods.  She tries to stay fairly active.  She denies chest pain, shortness of breath, palpitations, headache.   -Grief/Depression:  She tells me that unfortunately her husband passed away last year.  Has had difficulty with grief/depression/anxiety since that time.  She did go to a grief counseling group but did not find this helpful.  She would be interested in 1:1 therapy/counseling.  She does not want to try medications as she has multiple allergies and is concerned about having a reaction.   Depression screen [facility] 2/9 12/31/2017 12/31/2017 06/24/2017  Decreased Interest 3 0 0  Down, Depressed, Hopeless 0 0 0  PHQ - 2 Score 3 0 0  Altered sleeping 1 - -  Tired, decreased energy 3 - -  Change in appetite 3 - -  Feeling bad or failure about yourself  0 - -  Trouble concentrating 3 - -  Moving slowly or fidgety/restless 3 - -  Suicidal thoughts 0 - -  PHQ-9 Score 16 - -   GAD 7 : Generalized Anxiety Score 12/31/2017  Nervous, Anxious, on Edge 3  Control/stop worrying 3  Worry too much -  different things 2  Trouble relaxing 0  Restless 0  Easily annoyed or irritable 3  Afraid - awful might happen 2  Total GAD 7 Score 13   ROS:  A comprehensive ROS was completed and negative except as noted per HPI   Allergies  Allergen Reactions  . Cortizone-10 [Hydrocortisone] Hives  . Synthroid [Levothyroxine Sodium] Anaphylaxis    Internal shaking   . Azithromycin Hives  . Betadine [Povidone Iodine]     Aspiration   . Calcium Channel Blockers     Asthma  . Codeine     Flu-like symptoms   . Duloxetine Itching and Nausea Only    Dry mouth  . Fluoride Preparations Other (See Comments)    Blisters  . Iodine Other (See Comments)    Aspiration   . Levaquin [Levofloxacin In D5w]   . Lyrica [Pregabalin] Other (See Comments)    Make her very mean and angery  . Novocain [Procaine] Other (See Comments)    Heart Palpations  . Other     Beans,carbonation, dust and mold,floride,fried foods, household cleaner, melon,mushrooms,peppers,preservatives,rasisns,spicey,spinach,trees,milk products,shellfish  . Peanuts [Peanut Oil] Swelling    **Allergy to ALL NUTS** Face Swelling   . Penicillins   . Pollen Extract Other (See Comments)    Hemorrhage   . Prozac [Fluoxetine  Hcl]   . Shellfish Allergy Other (See Comments)    Death  . Wellbutrin [Bupropion] Hives    After 1 pill fist size bumps appeared  . Lanolin Rash  . Molds & Smuts Other (See Comments)  . Mushroom Extract Complex Nausea And Vomiting    Past Medical History:  Diagnosis Date  . Abdominal pain, epigastric   . Abdominal pain, epigastric   . Abdominal pain, right lower quadrant   . Abnormal weight gain   . Abnormality of gait   . Anxiety state, unspecified   . Asymptomatic varicose veins   . Blood vessel replaced by other means   . Cramp of limb   . Disorder of bone and cartilage, unspecified   . Dizziness and giddiness   . Edema   . Encounter for long-term (current) use of other medications   . Herpes zoster  without mention of complication   . Herpes zoster without mention of complication   . Hypersomnia, unspecified   . Insomnia, unspecified   . Long term (current) use of anticoagulants   . Lumbago   . Lumbago   . Muscle weakness (generalized)   . Myalgia and myositis, unspecified   . Obesity, unspecified   . Optic neuritis   . Other abnormal blood chemistry   . Other and unspecified hyperlipidemia   . Other cataract   . Pain in joint, site unspecified   . Pain in limb   . Pain in limb   . Palpitations   . Phlebitis and thrombophlebitis of lower extremities, unspecified   . Phlebitis and thrombophlebitis of other deep vessels of lower extremities   . Rash and other nonspecific skin eruption   . Spasm of muscle   . Syncope and collapse   . Tobacco use disorder   . Unspecified essential hypertension   . Unspecified hypothyroidism   . Unspecified sinusitis (chronic)   . Unspecified tinnitus     Past Surgical History:  Procedure Laterality Date  . CATARACT EXTRACTION, BILATERAL    . EYE SURGERY    . [place] IMPLANT  2008   DR DICKERSON  . HIP SURGERY LEFT  03/2010   DR [facility]  . HIP SURGERY RIGHT  06/2010   DR [facility]  . LEFT SHOULDER  2012   DR [facility]   . LOWER BACK  05/26/2007   DR JEFF BEANE  . RIGHT BREAST BIOPSY  1986  . RIGHT KNEE  1971    Social History   Socioeconomic History  . Marital status: Widowed    Spouse name: [name]  . Number of children: 0  . Years of education: college  . Highest education level: Not on file  Occupational History    Employer: [employer]    Comment: answers phones  Social Needs  . Financial resource strain: Not hard at all  . Food insecurity:    Worry: Never true    Inability: Never true  . Transportation needs:    Medical: No    Non-medical: No  Tobacco Use  . Smoking status: Heavy Tobacco Smoker    Packs/day: 1.00    Types: Cigarettes  . Smokeless tobacco: Never Used  . Tobacco  comment: one pack Daily Maybe more.  Substance and Sexual Activity  . Alcohol use: Yes  . Drug use: No  . Sexual activity: Not Currently    Partners: Male    Comment: husband  Lifestyle  . Physical activity:    Days per week: 1  day    Minutes per session: 30 min  . Stress: Very much  Relationships  . Social connections:    Talks on phone: More than three times a week    Gets together: Once a week    Attends religious service: Never    Active member of club or organization: No    Attends meetings of clubs or organizations: Never    Relationship status: Widowed  Other Topics Concern  . Not on file  Social History Narrative   Patient  Lives at home wit her husband [name]) . Patient still works.college education.   Right handed.   Caffiene -None    Family History  Problem Relation Age of Onset  . Stroke Mother   . Stroke Father   . Heart disease Father   . Stroke Brother     Health Maintenance  Topic Date Due  . MAMMOGRAM  01/03/2000  . DEXA SCAN  01/03/2015  . PNA vac Low Risk Adult (2 of 2 - PPSV23) 04/27/2017  . INFLUENZA VACCINE  11/25/2017  . PAP SMEAR  01/09/2019  . Fecal DNA (Cologuard)  10/03/2020  . TETANUS/TDAP  02/04/2024  . Hepatitis C Screening  Completed    ----------------------------------------------------------------------------------------------------------------------------------------------------------------------------------------------------------------- Physical Exam BP 120/70 (BP Location: Left Arm, Patient Position: Sitting, Cuff Size: Normal)   Pulse 76   Temp 98.3 F (36.8 C) (Oral)   Ht 5\' 6"  (1.676 m)   Wt 174 lb (78.9 kg)   SpO2 96%   BMI 28.08 kg/m   Physical Exam  Constitutional: She is oriented to person, place, and time. She appears well-nourished. No distress.  HENT:  Head: Normocephalic and atraumatic.  Mouth/Throat: Oropharynx is clear and moist.  Eyes: No scleral icterus.  Neck: Neck supple. No thyromegaly present.   Cardiovascular: Normal rate, regular rhythm and normal heart sounds.  Pulmonary/Chest: Effort normal and breath sounds normal.  Lymphadenopathy:    She has no cervical adenopathy.  Neurological: She is alert and oriented to person, place, and time.  Skin: Skin is warm and dry.  Slightly indurated area on R flank without rash or eschar  Psychiatric: She has a normal mood and affect. Her behavior is normal.    ------------------------------------------------------------------------------------------------------------------------------------------------------------------------------------------------------------------- Assessment and Plan  Hypothyroidism Managed by endocrinology Stable at this time.   Essential hypertension BP is well controlled currently Continue to monitor at routine appts Follow low salt diet.   Tick bite of back Lyme serology ordered  Anxiety Increased anxiety with depression since death of her husband last year Not interested in medication Interested in therapy/counseling, referral placed.

## 2017-12-31 NOTE — Patient Instructions (Addendum)
It was very nice to meet you I would recommend having your shingles vaccine at your pharmacy You are up to date on your tetanus and pneumonia vaccines   Tick Bite Information, Adult Ticks are insects that can bite. Most ticks live in shrubs and grassy areas. They climb onto people and animals that go by. Then they bite. Some ticks carry germs that can make you sick. How can I prevent tick bites?  Use an insect repellent that has 20% or higher of the ingredients DEET, picaridin, or IR3535. Put this insect repellent on: ? Bare skin. ? The tops of your boots. ? Your pant legs. ? The ends of your sleeves.  If you use an insect repellent that has the ingredient permethrin, make sure to follow the instructions on the bottle. Treat the following: ? Clothing. ? Supplies. ? Boots. ? Tents.  Wear long sleeves, long pants, and light colors.  Tuck your pant legs into your socks.  Stay in the middle of the trail.  Try not to walk through long grass.  Before going inside your house, check your clothes, hair, and skin for ticks. Make sure to check your head, neck, armpits, waist, groin, and joint areas.  Check for ticks every day.  When you come indoors: ? Wash your clothes right away. ? Shower right away. ? Dry your clothes in a dryer on high heat for 60 minutes or more. What is the right way to remove a tick? Remove a tick from your skin as soon as possible.  To remove a tick that is crawling on your skin: ? Go outdoors and brush the tick off. ? Use tape or a lint roller.  To remove a tick that is biting: ? Wash your hands. ? If you have latex gloves, put them on. ? Use tweezers, curved forceps, or a tick-removal tool to grasp the tick. Grasp the tick as close to your skin and as close to the tick's head as possible. ? Gently pull up until the tick lets go.  Try to keep the tick's head attached to its body.  Do not twist or jerk the tick.  Do not squeeze or crush the  tick.  Do not try to remove a tick with heat, alcohol, petroleum jelly, or fingernail polish. How should I get rid of a tick? Here are some ways to get rid of a tick that is alive:  Place the tick in rubbing alcohol.  Place the tick in a bag or container you can close tightly.  Wrap the tick tightly in tape.  Flush the tick down the toilet.  Contact a doctor if:  You have symptoms of a disease, such as: ? Pain in a muscle, joint, or bone. ? Trouble walking or moving your legs. ? Numbness in your legs. ? Inability to move (paralysis). ? A red rash that makes a circle (bull's-eye rash). ? Redness and swelling where the tick bit you. ? A fever. ? Throwing up (vomiting) over and over. ? Diarrhea. ? Weight loss. ? Tender and swollen lymph glands. ? Shortness of breath. ? Cough. ? Belly pain (abdominal pain). ? Headache. ? Being more tired than normal. ? A change in how alert (conscious) you are. ? Confusion. Get help right away if:  You cannot remove a tick.  A part of a tick breaks off and gets stuck in your skin.  You are feeling worse. Summary  Ticks may carry germs that can make you sick.  To  prevent tick bites, wear long sleeves, long pants, and light colors. Use insect repellent. Follow the instructions on the bottle.  If the tick is biting, do not try to remove it with heat, alcohol, petroleum jelly, or fingernail polish.  Use tweezers, curved forceps, or a tick-removal tool to grasp the tick. Gently pull up until the tick lets go. Do not twist or jerk the tick. Do not squeeze or crush the tick.  If you have symptoms, contact a doctor. This information is not intended to replace advice given to you by your health care provider. Make sure you discuss any questions you have with your health care provider. Document Released: 07/08/2009 Document Revised: 07/24/2016 Document Reviewed: 07/24/2016 Elsevier Interactive Patient Education  2018 [employer].

## 2017-12-31 NOTE — Assessment & Plan Note (Signed)
BP is well controlled currently Continue to monitor at routine appts Follow low salt diet.

## 2017-12-31 NOTE — Assessment & Plan Note (Signed)
Lyme serology ordered

## 2017-12-31 NOTE — Assessment & Plan Note (Signed)
Managed by endocrinology Stable at this time.

## 2017-12-31 NOTE — Assessment & Plan Note (Signed)
Increased anxiety with depression since death of her husband last year Not interested in medication Interested in therapy/counseling, referral placed.

## 2018-01-03 LAB — LYME AB/WESTERN BLOT REFLEX: Lyme IgG/IgM Ab: <0.91 {ISR} (ref 0.00–0.90)

## 2018-01-20 ENCOUNTER — Telehealth: Payer: Self-pay | Admitting: Emergency Medicine

## 2018-01-20 NOTE — Telephone Encounter (Signed)
Left a VM for patient to give the office a call back to schedule a nurse visit to receive the high dose flu vaccine.

## 2018-01-27 ENCOUNTER — Ambulatory Visit (INDEPENDENT_AMBULATORY_CARE_PROVIDER_SITE_OTHER): Payer: Medicare HMO

## 2018-01-27 ENCOUNTER — Encounter: Payer: Self-pay | Admitting: Family Medicine

## 2018-01-27 DIAGNOSIS — Z23 Encounter for immunization: Secondary | ICD-10-CM

## 2018-01-27 NOTE — Progress Notes (Signed)
Patient came into the office to receive her flu vaccine. Patient received the vaccine in her right deltoid & tolerated the injection well. No signs/symptoms prior to patient leaving the exam room. VIS given to patient.

## 2018-02-03 DIAGNOSIS — Z6827 Body mass index (BMI) 27.0-27.9, adult: Secondary | ICD-10-CM | POA: Diagnosis not present

## 2018-02-03 DIAGNOSIS — Z01419 Encounter for gynecological examination (general) (routine) without abnormal findings: Secondary | ICD-10-CM | POA: Diagnosis not present

## 2018-02-16 ENCOUNTER — Encounter: Payer: Self-pay | Admitting: Family Medicine

## 2018-02-16 ENCOUNTER — Ambulatory Visit (INDEPENDENT_AMBULATORY_CARE_PROVIDER_SITE_OTHER): Payer: Medicare HMO | Admitting: Family Medicine

## 2018-02-16 ENCOUNTER — Ambulatory Visit: Payer: Self-pay | Admitting: *Deleted

## 2018-02-16 ENCOUNTER — Telehealth: Payer: Self-pay | Admitting: Family Medicine

## 2018-02-16 DIAGNOSIS — W57XXXA Bitten or stung by nonvenomous insect and other nonvenomous arthropods, initial encounter: Secondary | ICD-10-CM | POA: Insufficient documentation

## 2018-02-16 DIAGNOSIS — Z720 Tobacco use: Secondary | ICD-10-CM

## 2018-02-16 DIAGNOSIS — S20461A Insect bite (nonvenomous) of right back wall of thorax, initial encounter: Secondary | ICD-10-CM

## 2018-02-16 MED ORDER — NICOTINE 21 MG/24HR TD PT24: 21.0000 mg | MEDICATED_PATCH | Freq: Every day | TRANSDERMAL | 0 refills | Status: DC

## 2018-02-16 MED ORDER — NICOTINE 7 MG/24HR TD PT24: 7.0000 mg | MEDICATED_PATCH | Freq: Every day | TRANSDERMAL | 0 refills | Status: DC

## 2018-02-16 NOTE — Assessment & Plan Note (Signed)
-  She will try benadryl or benadryl cream as needed for itching -She will f/u with me if not improving.

## 2018-02-16 NOTE — Telephone Encounter (Signed)
Copied from [place] [id number]. Topic: Quick Communication - See Telephone Encounter >> Feb 16, 2018  4:48 PM Rutherford Nail, NT wrote: CRM for notification. See Telephone encounter for: 02/16/18. Patient calling and states that her and Dr Zigmund Daniel were discussing nicotine patches at office today and she forgot to mention her history of blood clots in her legs. Would this be something that she could still take? Please advise. CB#: [phone]

## 2018-02-16 NOTE — Assessment & Plan Note (Signed)
-  Discussed quitting with her and she is interested.  -Would like to try patches initially. Has had some sent from [facility] but will extend taper, new rx sent in.  -She would be open to trying chantix again if unsuccessful with patches -She will return in 8 weeks   ~15 minutes spent discussing smoking cessation at todays visit as outlined above.
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
## 2018-02-16 NOTE — Progress Notes (Signed)
[name] - 67 y.o. female MRN [id number]  Date of birth: 04/11/1951  Subjective Chief Complaint  Patient presents with  . Insect Bite    HPI [name] is a 67 y.o. female with history of nicotine dependence here today to discuss smoking cessation.  She also has had a couple of insect bites she would like looked at.  She reports that she called [facility] and they have sent her 3 weeks of patches however the counselor she talked with recommended that given the amount that she smoked she should do an extended taper.  She would like to have an additional week of 21mg  patches and 7mg  patches.  She has tried chantix in the past but had nightmares.  She was also taking opioids and lyrica during while she was dealing with back pain and thinks this contributed to this.  Insect bites are itchy, non painful.  No swelling or drainage.  Has not tried anything on this area.   ROS:  A comprehensive ROS was completed and negative except as noted per HPI  Allergies  Allergen Reactions  . Cortizone-10 [Hydrocortisone] Hives  . Synthroid [Levothyroxine Sodium] Anaphylaxis    Internal shaking   . Azithromycin Hives  . Betadine [Povidone Iodine]     Aspiration   . Calcium Channel Blockers     Asthma  . Codeine     Flu-like symptoms   . Duloxetine Itching and Nausea Only    Dry mouth  . Fluoride Preparations Other (See Comments)    Blisters  . Iodine Other (See Comments)    Aspiration   . Levaquin [Levofloxacin In D5w]   . Lyrica [Pregabalin] Other (See Comments)    Make her very mean and angery  . Novocain [Procaine] Other (See Comments)    Heart Palpations  . Other     Beans,carbonation, dust and mold,floride,fried foods, household cleaner, melon,mushrooms,peppers,preservatives,rasisns,spicey,spinach,trees,milk products,shellfish  . Peanuts [Peanut Oil] Swelling    **Allergy to ALL NUTS** Face Swelling   . Penicillins   . Pollen Extract Other (See Comments)    Hemorrhage   .  Prozac [Fluoxetine Hcl]   . Shellfish Allergy Other (See Comments)    Death  . Wellbutrin [Bupropion] Hives    After 1 pill fist size bumps appeared  . Lanolin Rash  . Molds & Smuts Other (See Comments)  . Mushroom Extract Complex Nausea And Vomiting    Past Medical History:  Diagnosis Date  . Abdominal pain, epigastric   . Abdominal pain, epigastric   . Abdominal pain, right lower quadrant   . Abnormal weight gain   . Abnormality of gait   . Anxiety state, unspecified   . Asymptomatic varicose veins   . Blood vessel replaced by other means   . Cramp of limb   . Disorder of bone and cartilage, unspecified   . Dizziness and giddiness   . Edema   . Encounter for long-term (current) use of other medications   . Herpes zoster without mention of complication   . Herpes zoster without mention of complication   . Hypersomnia, unspecified   . Insomnia, unspecified   . Long term (current) use of anticoagulants   . Lumbago   . Lumbago   . Muscle weakness (generalized)   . Myalgia and myositis, unspecified   . Obesity, unspecified   . Optic neuritis   . Other abnormal blood chemistry   . Other and unspecified hyperlipidemia   . Other cataract   . Pain  in joint, site unspecified   . Pain in limb   . Pain in limb   . Palpitations   . Phlebitis and thrombophlebitis of lower extremities, unspecified   . Phlebitis and thrombophlebitis of other deep vessels of lower extremities   . Rash and other nonspecific skin eruption   . Spasm of muscle   . Syncope and collapse   . Tobacco use disorder   . Unspecified essential hypertension   . Unspecified hypothyroidism   . Unspecified sinusitis (chronic)   . Unspecified tinnitus     Past Surgical History:  Procedure Laterality Date  . CATARACT EXTRACTION, BILATERAL    . EYE SURGERY    . [place] IMPLANT  2008   DR DICKERSON  . HIP SURGERY LEFT  03/2010   DR [facility]  . HIP SURGERY RIGHT  06/2010   DR [facility]    . LEFT SHOULDER  2012   DR [facility]   . LOWER BACK  05/26/2007   DR JEFF BEANE  . RIGHT BREAST BIOPSY  1986  . RIGHT KNEE  1971    Social History   Socioeconomic History  . Marital status: Widowed    Spouse name: [name]  . Number of children: 0  . Years of education: college  . Highest education level: Not on file  Occupational History    Employer: [employer]    Comment: answers phones  Social Needs  . Financial resource strain: Not hard at all  . Food insecurity:    Worry: Never true    Inability: Never true  . Transportation needs:    Medical: No    Non-medical: No  Tobacco Use  . Smoking status: Heavy Tobacco Smoker    Packs/day: 1.00    Types: Cigarettes  . Smokeless tobacco: Never Used  . Tobacco comment: one pack Daily Maybe more.  Substance and Sexual Activity  . Alcohol use: Yes  . Drug use: No  . Sexual activity: Not Currently    Partners: Male    Comment: husband  Lifestyle  . Physical activity:    Days per week: 1 day    Minutes per session: 30 min  . Stress: Very much  Relationships  . Social connections:    Talks on phone: More than three times a week    Gets together: Once a week    Attends religious service: Never    Active member of club or organization: No    Attends meetings of clubs or organizations: Never    Relationship status: Widowed  Other Topics Concern  . Not on file  Social History Narrative   Patient  Lives at home wit her husband [name]) . Patient still works.college education.   Right handed.   Caffiene -None    Family History  Problem Relation Age of Onset  . Stroke Mother   . Stroke Father   . Heart disease Father   . Stroke Brother     Health Maintenance  Topic Date Due  . DEXA SCAN  01/03/2015  . PNA vac Low Risk Adult (2 of 2 - PPSV23) 04/27/2017  . MAMMOGRAM  02/09/2019 (Originally 01/03/2000)  . PAP SMEAR  01/09/2019  . Fecal DNA (Cologuard)  10/03/2020  . TETANUS/TDAP  02/04/2024  . INFLUENZA  VACCINE  Completed  . Hepatitis C Screening  Completed    ----------------------------------------------------------------------------------------------------------------------------------------------------------------------------------------------------------------- Physical Exam BP 130/70   Pulse (!) 56   Temp 97.8 F (36.6 C) (Oral)   Wt 171 lb 9.6  oz (77.8 kg)   SpO2 98%   BMI 27.70 kg/m   Physical Exam  Constitutional: She appears well-nourished. No distress.  HENT:  Head: Normocephalic and atraumatic.  Eyes: No scleral icterus.  Neck: Neck supple. No thyromegaly present.  Cardiovascular: Normal rate, regular rhythm and normal heart sounds.  Pulmonary/Chest: Effort normal and breath sounds normal.  Lymphadenopathy:    She has no cervical adenopathy.  Skin: Skin is warm and dry.  Small slightly raised erythematous papule.  Scabbed over center.   Psychiatric: She has a normal mood and affect. Her behavior is normal.    ------------------------------------------------------------------------------------------------------------------------------------------------------------------------------------------------------------------- Assessment and Plan  Tobacco abuse -Discussed quitting with her and she is interested.  -Would like to try patches initially. Has had some sent from [facility] but will extend taper, new rx sent in.  -She would be open to trying chantix again if unsuccessful with patches -She will return in 8 weeks   ~15 minutes spent discussing smoking cessation at todays visit as outlined above.   Insect bite -She will try benadryl or benadryl cream as needed for itching -She will f/u with me if not improving.

## 2018-02-17 ENCOUNTER — Ambulatory Visit (INDEPENDENT_AMBULATORY_CARE_PROVIDER_SITE_OTHER): Payer: Medicare HMO | Admitting: Family Medicine

## 2018-02-17 ENCOUNTER — Encounter: Payer: Self-pay | Admitting: Family Medicine

## 2018-02-17 VITALS — BP 120/72 | HR 82 | Temp 97.6°F | Wt 171.6 lb

## 2018-02-17 DIAGNOSIS — M79604 Pain in right leg: Secondary | ICD-10-CM | POA: Diagnosis not present

## 2018-02-17 DIAGNOSIS — M79605 Pain in left leg: Secondary | ICD-10-CM | POA: Diagnosis not present

## 2018-02-17 DIAGNOSIS — Z86718 Personal history of other venous thrombosis and embolism: Secondary | ICD-10-CM | POA: Diagnosis not present

## 2018-02-17 NOTE — Telephone Encounter (Signed)
Please advise 

## 2018-02-17 NOTE — Assessment & Plan Note (Signed)
-  B/L venous doppler ordered to eval for DVT.

## 2018-02-17 NOTE — Progress Notes (Signed)
[name] - 67 y.o. female MRN [id number]  Date of birth: 06/27/1950  Subjective Chief Complaint  Patient presents with  . Leg Swelling    HPI [name] is a 67 y.o. female with history of DVT and nicotine dependence here today with complaint of leg swelling and pain.  Was here yesterday but states that she forgot to mention this at that appointment.  She reports that she has pain in the back of the leg where the leg meets the chair as well as mild swelling.  She states that this feels similar to when she had previous DVT.  She denies fever, chills, shortness of breath, or chest pain.   ROS:  A comprehensive ROS was completed and negative except as noted per HPI  Allergies  Allergen Reactions  . Cortizone-10 [Hydrocortisone] Hives  . Synthroid [Levothyroxine Sodium] Anaphylaxis    Internal shaking   . Azithromycin Hives  . Betadine [Povidone Iodine]     Aspiration   . Calcium Channel Blockers     Asthma  . Codeine     Flu-like symptoms   . Duloxetine Itching and Nausea Only    Dry mouth  . Fluoride Preparations Other (See Comments)    Blisters  . Iodine Other (See Comments)    Aspiration   . Levaquin [Levofloxacin In D5w]   . Lyrica [Pregabalin] Other (See Comments)    Make her very mean and angery  . Novocain [Procaine] Other (See Comments)    Heart Palpations  . Other     Beans,carbonation, dust and mold,floride,fried foods, household cleaner, melon,mushrooms,peppers,preservatives,rasisns,spicey,spinach,trees,milk products,shellfish  . Peanuts [Peanut Oil] Swelling    **Allergy to ALL NUTS** Face Swelling   . Penicillins   . Pollen Extract Other (See Comments)    Hemorrhage   . Prozac [Fluoxetine Hcl]   . Shellfish Allergy Other (See Comments)    Death  . Wellbutrin [Bupropion] Hives    After 1 pill fist size bumps appeared  . Lanolin Rash  . Molds & Smuts Other (See Comments)  . Mushroom Extract Complex Nausea And Vomiting    Past Medical History:    Diagnosis Date  . Abdominal pain, epigastric   . Abdominal pain, epigastric   . Abdominal pain, right lower quadrant   . Abnormal weight gain   . Abnormality of gait   . Anxiety state, unspecified   . Asymptomatic varicose veins   . Blood vessel replaced by other means   . Cramp of limb   . Disorder of bone and cartilage, unspecified   . Dizziness and giddiness   . Edema   . Encounter for long-term (current) use of other medications   . Herpes zoster without mention of complication   . Herpes zoster without mention of complication   . Hypersomnia, unspecified   . Insomnia, unspecified   . Long term (current) use of anticoagulants   . Lumbago   . Lumbago   . Muscle weakness (generalized)   . Myalgia and myositis, unspecified   . Obesity, unspecified   . Optic neuritis   . Other abnormal blood chemistry   . Other and unspecified hyperlipidemia   . Other cataract   . Pain in joint, site unspecified   . Pain in limb   . Pain in limb   . Palpitations   . Phlebitis and thrombophlebitis of lower extremities, unspecified   . Phlebitis and thrombophlebitis of other deep vessels of lower extremities   . Rash and other nonspecific skin  eruption   . Spasm of muscle   . Syncope and collapse   . Tobacco use disorder   . Unspecified essential hypertension   . Unspecified hypothyroidism   . Unspecified sinusitis (chronic)   . Unspecified tinnitus     Past Surgical History:  Procedure Laterality Date  . CATARACT EXTRACTION, BILATERAL    . EYE SURGERY    . [place] IMPLANT  2008   DR DICKERSON  . HIP SURGERY LEFT  03/2010   DR [facility]  . HIP SURGERY RIGHT  06/2010   [facility]  . LEFT SHOULDER  2012   DR [facility]   . LOWER BACK  05/26/2007   DR JEFF BEANE  . RIGHT BREAST BIOPSY  1986  . RIGHT KNEE  1971    Social History   Socioeconomic History  . Marital status: Widowed    Spouse name: [name]  . Number of children: 0  . Years of  education: college  . Highest education level: Not on file  Occupational History    Employer: [employer]    Comment: answers phones  Social Needs  . Financial resource strain: Not hard at all  . Food insecurity:    Worry: Never true    Inability: Never true  . Transportation needs:    Medical: No    Non-medical: No  Tobacco Use  . Smoking status: Heavy Tobacco Smoker    Packs/day: 1.00    Types: Cigarettes  . Smokeless tobacco: Never Used  . Tobacco comment: one pack Daily Maybe more.  Substance and Sexual Activity  . Alcohol use: Yes  . Drug use: No  . Sexual activity: Not Currently    Partners: Male    Comment: husband  Lifestyle  . Physical activity:    Days per week: 1 day    Minutes per session: 30 min  . Stress: Very much  Relationships  . Social connections:    Talks on phone: More than three times a week    Gets together: Once a week    Attends religious service: Never    Active member of club or organization: No    Attends meetings of clubs or organizations: Never    Relationship status: Widowed  Other Topics Concern  . Not on file  Social History Narrative   Patient  Lives at home wit her husband [name]) . Patient still works.college education.   Right handed.   Caffiene -None    Family History  Problem Relation Age of Onset  . Stroke Mother   . Stroke Father   . Heart disease Father   . Stroke Brother     Health Maintenance  Topic Date Due  . DEXA SCAN  01/03/2015  . PNA vac Low Risk Adult (2 of 2 - PPSV23) 04/27/2017  . MAMMOGRAM  02/09/2019 (Originally 01/03/2000)  . PAP SMEAR  01/09/2019  . Fecal DNA (Cologuard)  10/03/2020  . TETANUS/TDAP  02/04/2024  . INFLUENZA VACCINE  Completed  . Hepatitis C Screening  Completed     ----------------------------------------------------------------------------------------------------------------------------------------------------------------------------------------------------------------- Physical Exam BP 120/72   Pulse 82   Temp 97.6 F (36.4 C)   Wt 171 lb 9.6 oz (77.8 kg)   SpO2 97%   BMI 27.70 kg/m   Physical Exam  Constitutional: She appears well-nourished. No distress.  HENT:  Head: Normocephalic and atraumatic.  Cardiovascular: Normal rate, regular rhythm and normal heart sounds.  Pulmonary/Chest: Effort normal and breath sounds normal.  Skin: No rash noted.  Multiple varicosities/spider veins in lower extremities.  TTP in bilateral popliteal fossa without fullness or swelling.   Psychiatric: She has a normal mood and affect. Her behavior is normal.    ------------------------------------------------------------------------------------------------------------------------------------------------------------------------------------------------------------------- Assessment and Plan  Bilateral leg pain -B/L venous doppler ordered to eval for DVT.

## 2018-02-17 NOTE — Telephone Encounter (Signed)
FYI

## 2018-02-23 ENCOUNTER — Telehealth: Payer: Self-pay | Admitting: Family Medicine

## 2018-02-23 ENCOUNTER — Telehealth: Payer: Self-pay | Admitting: Emergency Medicine

## 2018-02-23 ENCOUNTER — Ambulatory Visit (HOSPITAL_COMMUNITY)
Admission: RE | Admit: 2018-02-23 | Discharge: 2018-02-23 | Disposition: A | Discharge status: Home / Self Care | Payer: Medicare HMO | Source: Ambulatory Visit | Attending: Family Medicine | Admitting: Family Medicine

## 2018-02-23 DIAGNOSIS — Z86718 Personal history of other venous thrombosis and embolism: Secondary | ICD-10-CM | POA: Diagnosis not present

## 2018-02-23 DIAGNOSIS — M79604 Pain in right leg: Secondary | ICD-10-CM | POA: Diagnosis not present

## 2018-02-23 DIAGNOSIS — M79605 Pain in left leg: Secondary | ICD-10-CM | POA: Insufficient documentation

## 2018-02-23 NOTE — Telephone Encounter (Signed)
[name] called with preliminary results of doppler on lower extremities. Results are negative for DVT and non superficial thrombosis.

## 2018-02-23 NOTE — Telephone Encounter (Signed)
Copied from [place] [phone]. Topic: General - Call Back - No Documentation >> Feb 23, 2018 12:02 PM Virl Axe D wrote: Reason for CRM: Pt states she missed a call and there was no Vm. Called back 4 times and was disconnected each time. Please advise pt if there is any information to pass along. Okay to leave VM with Details.    Left a VM for patient regarding if she was wanting a cardiology referral placed?

## 2018-02-23 NOTE — Telephone Encounter (Signed)
Copied from [place] [phone]. Topic: General - Other >> Feb 18, 2018 10:08 AM Leward Quan A wrote: Reason for CRM: Patient called to say that she received a call from cardiovascular regarding an appointment. She stated that she did not get a name of the place all she had was this Ph# [phone] she stated that they are requesting a referral for the cardiovascular. Please advise Ph# [phone]    Left a VM for patient regarding last phone note 02/18/2018

## 2018-02-24 NOTE — Progress Notes (Signed)
US negative for DVT

## 2018-03-14 ENCOUNTER — Telehealth: Payer: Self-pay | Admitting: Family Medicine

## 2018-03-14 ENCOUNTER — Other Ambulatory Visit: Payer: Self-pay | Admitting: Family Medicine

## 2018-03-14 NOTE — Telephone Encounter (Signed)
Rx canceled.

## 2018-03-14 NOTE — Telephone Encounter (Signed)
Copied from [place] [phone]. Topic: Quick Communication - See Telephone Encounter >> Mar 14, 2018 11:27 AM Bea Graff, NT wrote: CRM for notification. See Telephone encounter for: 03/14/18. Pt states that she will not need the rx for nicotine (NICODERM CQ) 21 mg/24hr patch as it has caused her to have abscess tooth such as the gum did. She wanted Dr. Zigmund Daniel to cancel the rx.

## 2018-03-14 NOTE — Telephone Encounter (Signed)
FYI

## 2018-04-21 ENCOUNTER — Ambulatory Visit (INDEPENDENT_AMBULATORY_CARE_PROVIDER_SITE_OTHER): Payer: Medicare HMO | Admitting: Nurse Practitioner

## 2018-04-21 ENCOUNTER — Encounter: Payer: Self-pay | Admitting: Nurse Practitioner

## 2018-04-21 ENCOUNTER — Ambulatory Visit: Payer: Self-pay | Admitting: *Deleted

## 2018-04-21 VITALS — BP 128/72 | HR 81 | Temp 97.5°F | Ht 66.0 in | Wt 173.6 lb

## 2018-04-21 DIAGNOSIS — W5501XA Bitten by cat, initial encounter: Secondary | ICD-10-CM | POA: Diagnosis not present

## 2018-04-21 DIAGNOSIS — S91331A Puncture wound without foreign body, right foot, initial encounter: Secondary | ICD-10-CM

## 2018-04-21 DIAGNOSIS — S91351A Open bite, right foot, initial encounter: Secondary | ICD-10-CM | POA: Diagnosis not present

## 2018-04-21 MED ORDER — SACCHAROMYCES BOULARDII 250 MG PO CAPS: 250.0000 mg | ORAL_CAPSULE | Freq: Two times a day (BID) | ORAL | 0 refills | Status: AC

## 2018-04-21 MED ORDER — DOXYCYCLINE HYCLATE 100 MG PO TABS: 100.0000 mg | ORAL_TABLET | Freq: Two times a day (BID) | ORAL | 0 refills | Status: AC

## 2018-04-21 NOTE — Telephone Encounter (Addendum)
Contacted pt regarding symptoms; the pt states that she was bitten by a cat on her foot last night that did puncture the skin; she was bitten between her great toe and 2nd toe on the left foot; her skin was broken, and she had a lot of bleeding; the pt says that the cat was a stray, and they are not sure of it's vaccination status; nurse triage initiated; the pt normally sees Dr Luetta Nutting but he has no availability; spoke with Laurey Arrow at [facility] and she says that the pt can be seen in the office, but depending on the severity of the bite, the pt could still be directed to the ED; explained to pt and she verbalized understanding; pt offered and accepted appointment with Wilfred Lacy, [facility], 04/21/18 at 1400; she once again verbalized understanding; will route to office for notification of this upcoming appointment.   Reason for Disposition . [1] Any break in skin from BITE (e.g., cut, puncture or scratch) AND[2] PET animial (e.g., dog, cat, or ferret) at risk for RABIES (e.g., sick, stray, unprovoked bite, developing country)  Answer Assessment - Initial Assessment Questions 1. ANIMAL: "What type of animal caused the bite?" "Is the injury from a bite or a claw?" If the animal is a dog or a cat, ask: "Was it a pet or a stray?" "Was it acting ill or behaving strangely?"     Cat bite; puncture to area between left great toe and 2nd toe 2. LOCATION: "Where is the bite located?"      Left foot; area between left great toe and 2nd toe 3. SIZE: "How big is the bite?" "What does it look like?"      Small, size of one tooth; area is reddened  4. ONSET: "When did the bite happen?" (Minutes or hours ago)      Hours 04/20/18 at 1600 5. CIRCUMSTANCES: "Tell me how this happened."      Pt tried to pick up cat 6. TETANUS: "When was the last tetanus booster?"     Pt said booster would be due 2020  7. PREGNANCY: "Is there any chance you are pregnant?" "When was your last menstrual period?"      no  Protocols used: ANIMAL BITE-A-AH

## 2018-04-21 NOTE — Patient Instructions (Addendum)
Please contact local Health department for possible rabies prophylaxis treatment.  Start oral abx today. Also start florastor or curturelle probiotics 1cap BID x 2weeks to prevent diarrhea.  Return to office if no improvement in wound in 1week  Animal Bite, Adult Animal bites range from mild to serious. An animal bite can result in any of these injuries:  A scratch.  A deep, open cut.  A puncture of the skin.  A crush injury.  Tearing away of the skin or a body part.  A bone injury. A small bite from a house pet is usually less serious than a bite from a stray or wild animal, such as a raccoon, fox, skunk, or bat. That is because stray and wild animals have a higher risk of carrying a serious infection called rabies, which can be passed to humans through a bite. What increases the risk? You are more likely to be bitten by an animal if:  You are around unfamiliar pets.  You disturb an animal when it is eating, sleeping, or caring for its babies.  You are outdoors in a place where small, wild animals roam freely. What are the signs or symptoms? Common symptoms of an animal bite include:  Pain.  Bleeding.  Swelling.  Bruising. How is this diagnosed? This condition may be diagnosed based on a physical exam and medical history. Your health care provider will examine your wound and ask for details about the animal and how the bite happened. You may also have tests, such as:  Blood tests to check for infection.  X-rays to check for damage to bones or joints.  Taking a fluid sample from your wound and checking it for infection (culture test). How is this treated? Treatment varies depending on the type of animal, where the bite is on your body, and your medical history. Treatment may include:  Caring for the wound. This often includes cleaning the wound, rinsing out (flushing) the wound with saline solution, and applying a bandage (dressing). In some cases, the wound may be  closed with stitches (sutures), staples, skin glue, or adhesive strips.  Antibiotic medicine to prevent or treat infection. This medicine may be prescribed in pill or ointment form. If the bite area becomes infected, the medicine may be given through an IV.  A tetanus shot to prevent tetanus infection.  Rabies treatment to prevent rabies infection. This will be done if the animal could have rabies.  Surgery. This may be done if a bite gets infected or if there is damage that needs to be repaired. Follow these instructions at home: Wound care   Follow instructions from your health care provider about how to take care of your wound. Make sure you: ? Wash your hands with soap and water before you change your dressing. If soap and water are not available, use hand sanitizer. ? Change your dressing as told by your health care provider. ? Leave sutures, skin glue, or adhesive strips in place. These skin closures may need to stay in place for 2 weeks or longer. If adhesive strip edges start to loosen and curl up, you may trim the loose edges. Do not remove adhesive strips completely unless your health care provider tells you to do that.  Check your wound every day for signs of infection. Check for: ? More redness, swelling, or pain. ? More fluid or blood. ? Warmth. ? Pus or a bad smell. Medicines  Take or apply over-the-counter and prescription medicines only as told by  your health care provider.  If you were prescribed an antibiotic, take or apply it as told by your health care provider. Do not stop using the antibiotic even if your condition improves. General instructions   Keep the injured area raised (elevated) above the level of your heart while you are sitting or lying down, if this is possible.  If directed, put ice on the injured area. ? Put ice in a plastic bag. ? Place a towel between your skin and the bag. ? Leave the ice on for 20 minutes, 2-3 times per day.  Keep all  follow-up visits as told by your health care provider. This is important. Contact a health care provider if:  You have more redness, swelling, or pain around your wound.  Your wound feels warm to the touch.  You have a fever or chills.  You have a general feeling of sickness (malaise).  You feel nauseous or you vomit.  You have pain that does not get better. Get help right away if:  You have a red streak that leads away from your wound.  You have non-clear fluid or more blood coming from your wound.  There is pus or a bad smell coming from your wound.  You have trouble moving your injured area.  You have numbness or tingling that extends beyond the wound. Summary  Animal bites can range from mild to serious. An animal bite can cause a scratch on the skin, a deep open cut, a puncture of the skin, a crush injury, tearing away of the skin or a body part, or a bone injury.  Your health care provider will examine your wound and ask for details about the animal and how the bite happened.  You may also have tests such as a blood test, X-ray, or testing of a fluid sample from your wound (culture test).  Treatment may include wound care, antibiotic medicine, a tetanus shot, and rabies treatment if the animal could have rabies. This information is not intended to replace advice given to you by your health care provider. Make sure you discuss any questions you have with your health care provider. Document Released: 12/30/2010 Document Revised: 10/22/2016 Document Reviewed: 10/22/2016 Elsevier Interactive Patient Education  2019 [employer].

## 2018-04-21 NOTE — Progress Notes (Signed)
Subjective:  Patient ID: [name], female    DOB: 1950/10/19  Age: 67 y.o. MRN: [id number]  CC: Animal Bite (cat bite on left foot towards big toe, punctured skin a little, stray cat,Tdap in 2015, no pain, itching and redness. happened yesterday.  )  Animal Bite   The incident occurred yesterday. The incident occurred at home. She came to the ER via personal transport. There is an injury to the right foot. The pain is mild. It is unlikely that a foreign body is present. Pertinent negatives include no inability to bear weight, no pain when bearing weight, no focal weakness, no tingling and no weakness. There have been no prior injuries to these areas. She is right-handed. Her tetanus status is UTD. She has been behaving normally. She has received no recent medical care.  bite by spray cat. Unprovoked[place]  Reviewed past Medical, Social and Family history today.  Outpatient Medications Prior to Visit  Medication Sig Dispense Refill  . aspirin 325 MG tablet Take 325 mg by mouth daily.     . Biotin 5000 MCG TABS Take by mouth daily.    . Cholecalciferol (VITAMIN D-3 PO) Take 1 tablet by mouth daily.    Marland Kitchen EPINEPHrine 0.3 mg/0.3 mL IJ SOAJ injection Inject 0.3 mLs (0.3 mg total) into the muscle as needed. 2 Device 0  . TIROSINT 75 MCG CAPS Take 1 capsule by mouth daily.  11  . triamcinolone ointment (KENALOG) 0.1 % triamcinolone acetonide 0.1 % topical ointment  APPLY A THIN LAYER TO THE AFFECTED vulvar AREA(S) BY TOPICAL ROUTE daily    . Dextran 70-Hypromellose (ARTIFICIAL TEARS) 0.1-0.3 % SOLN Artificial Tears    . Polyethyl Glycol-Propyl Glycol (SYSTANE PRESERVATIVE FREE OP) Apply to eye.     No facility-administered medications prior to visit.     ROS See HPI  Objective:  BP 128/72   Pulse 81   Temp (!) 97.5 F (36.4 C) (Oral)   Ht 5\' 6"  (1.676 m)   Wt 173 lb 9.6 oz (78.7 kg)   SpO2 96%   BMI 28.02 kg/m   BP Readings from Last 3  Encounters:  04/21/18 128/72  02/17/18 120/72  02/16/18 130/70    Wt Readings from Last 3 Encounters:  04/21/18 173 lb 9.6 oz (78.7 kg)  02/17/18 171 lb 9.6 oz (77.8 kg)  02/16/18 171 lb 9.6 oz (77.8 kg)    Physical Exam Vitals signs reviewed.  Musculoskeletal: Normal range of motion.        General: Tenderness present. No swelling or deformity.     Right lower leg: No edema.     Left lower leg: No edema.     Right foot: Normal range of motion. Tenderness present. No bony tenderness, swelling or deformity.       Feet:  Skin:    Findings: Bruising and erythema present.     Lab Results  Component Value Date   WBC 7.8 11/25/2016   HGB 14.8 11/25/2016   HCT 44.2 11/25/2016   PLT 273 11/25/2016   GLUCOSE 90 11/25/2016   CHOL 221 (H) 11/25/2016   TRIG 105 11/25/2016   HDL 61 11/25/2016   LDLCALC 139 (H) 11/25/2016   ALT 6 11/25/2016   AST 14 11/25/2016   NA 140 11/25/2016   K 5.0 11/25/2016   CL 103 11/25/2016   CREATININE 0.72 11/25/2016   BUN 11 11/25/2016   CO2 24 11/25/2016   TSH 1.000  11/25/2016   INR 1.21 07/07/2010    Vas [place] Lower Extremity Venous (dvt)  Result Date: 02/23/2018  Lower Venous Study Indications: Pain. Other Indications: History of DVT. Performing Technologist: Ralene Cork RVT  Examination Guidelines: A complete evaluation includes B-mode imaging, spectral Doppler, color Doppler, and power Doppler as needed of all accessible portions of each vessel. Bilateral testing is considered an integral part of a complete examination. Limited examinations for reoccurring indications may be performed as noted.  Right Venous Findings: +---------+---------------+---------+-----------+----------+-------+          CompressibilityPhasicitySpontaneityPropertiesSummary +---------+---------------+---------+-----------+----------+-------+ CFV      Full           Yes      Yes                           +---------+---------------+---------+-----------+----------+-------+ SFJ      Full                    Yes                          +---------+---------------+---------+-----------+----------+-------+ FV Prox  Full           Yes      Yes                          +---------+---------------+---------+-----------+----------+-------+ FV Mid   Full           Yes      Yes                          +---------+---------------+---------+-----------+----------+-------+ FV DistalFull           Yes      Yes                          +---------+---------------+---------+-----------+----------+-------+ POP      Full           Yes      Yes                          +---------+---------------+---------+-----------+----------+-------+ PTV      Full                    Yes                          +---------+---------------+---------+-----------+----------+-------+ PERO     Full                    Yes                          +---------+---------------+---------+-----------+----------+-------+ GSV      Full           Yes      Yes                          +---------+---------------+---------+-----------+----------+-------+  Left Venous Findings: +---------+---------------+---------+-----------+----------+-------+          CompressibilityPhasicitySpontaneityPropertiesSummary +---------+---------------+---------+-----------+----------+-------+ CFV      Full           Yes      Yes                          +---------+---------------+---------+-----------+----------+-------+  SFJ      Full                    Yes                          +---------+---------------+---------+-----------+----------+-------+ FV Prox  Full           Yes      Yes                          +---------+---------------+---------+-----------+----------+-------+ FV Mid   Full           Yes      Yes                           +---------+---------------+---------+-----------+----------+-------+ FV DistalFull           Yes      Yes                          +---------+---------------+---------+-----------+----------+-------+ POP      Full           Yes      Yes                          +---------+---------------+---------+-----------+----------+-------+ PTV      Full                    Yes                          +---------+---------------+---------+-----------+----------+-------+ PERO     Full                    Yes                          +---------+---------------+---------+-----------+----------+-------+ GSV      Full           Yes      Yes                          +---------+---------------+---------+-----------+----------+-------+   Findings reported to [facility] at 3:40 pm.  Summary: Right: There is no evidence of deep vein thrombosis in the lower extremity.There is no evidence of superficial venous thrombosis. Left: There is no evidence of deep vein thrombosis in the lower extremity.There is no evidence of superficial venous thrombosis.  *See table(s) above for measurements and observations. Electronically signed by Deitra Mayo MD on 02/23/2018 at 5:03:02 PM.    Final     Assessment & Plan:   Maycel was seen today for animal bite.  Diagnoses and all orders for this visit:  Cat bite of foot, right, initial encounter -     doxycycline (VIBRA-TABS) 100 MG tablet; Take 1 tablet (100 mg total) by mouth 2 (two) times daily for 7 days. -     saccharomyces boulardii (FLORASTOR) 250 MG capsule; Take 1 capsule (250 mg total) by mouth 2 (two) times daily for 14 days.  Puncture wound of right foot, initial encounter -     doxycycline (VIBRA-TABS) 100 MG tablet; Take 1 tablet (100 mg total) by mouth 2 (two) times daily for 7 days. -     saccharomyces boulardii (FLORASTOR) 250 MG capsule;  Take 1 capsule (250 mg total) by mouth 2 (two) times daily for 14 days.   I am having Aubreanna K.  Cost start on doxycycline and saccharomyces boulardii. I am also having her maintain her aspirin, Polyethyl Glycol-Propyl Glycol (SYSTANE PRESERVATIVE FREE OP), Biotin, EPINEPHrine, Cholecalciferol (VITAMIN D-3 PO), Dextran 70-Hypromellose, triamcinolone ointment, and TIROSINT.  Meds ordered this encounter  Medications  . doxycycline (VIBRA-TABS) 100 MG tablet    Sig: Take 1 tablet (100 mg total) by mouth 2 (two) times daily for 7 days.    Dispense:  14 tablet    Refill:  0    Order Specific Question:   Supervising Provider    Answer:   Lucille Passy [[id number]]  . saccharomyces boulardii (FLORASTOR) 250 MG capsule    Sig: Take 1 capsule (250 mg total) by mouth 2 (two) times daily for 14 days.    Dispense:  28 capsule    Refill:  0    Order Specific Question:   Supervising Provider    Answer:   Lucille Passy [[id number]]    Problem List Items Addressed This Visit    None    Visit Diagnoses    Cat bite of foot, right, initial encounter    -  Primary   Relevant Medications   doxycycline (VIBRA-TABS) 100 MG tablet   saccharomyces boulardii (FLORASTOR) 250 MG capsule   Puncture wound of right foot, initial encounter       Relevant Medications   doxycycline (VIBRA-TABS) 100 MG tablet   saccharomyces boulardii (FLORASTOR) 250 MG capsule       Follow-up: No follow-ups on file.  Wilfred Lacy, NP

## 2018-05-02 ENCOUNTER — Emergency Department (HOSPITAL_COMMUNITY)
Admission: EM | Admit: 2018-05-02 | Discharge: 2018-05-02 | Disposition: A | Discharge status: Home / Self Care | Payer: Medicare HMO | Attending: Emergency Medicine | Admitting: Emergency Medicine

## 2018-05-02 ENCOUNTER — Encounter (HOSPITAL_COMMUNITY): Payer: Self-pay | Admitting: *Deleted

## 2018-05-02 DIAGNOSIS — Z7982 Long term (current) use of aspirin: Secondary | ICD-10-CM | POA: Insufficient documentation

## 2018-05-02 DIAGNOSIS — F17210 Nicotine dependence, cigarettes, uncomplicated: Secondary | ICD-10-CM | POA: Insufficient documentation

## 2018-05-02 DIAGNOSIS — Z2914 Encounter for prophylactic rabies immune globin: Secondary | ICD-10-CM | POA: Diagnosis not present

## 2018-05-02 DIAGNOSIS — E039 Hypothyroidism, unspecified: Secondary | ICD-10-CM | POA: Diagnosis not present

## 2018-05-02 DIAGNOSIS — I10 Essential (primary) hypertension: Secondary | ICD-10-CM | POA: Diagnosis not present

## 2018-05-02 DIAGNOSIS — Z91010 Allergy to peanuts: Secondary | ICD-10-CM | POA: Insufficient documentation

## 2018-05-02 DIAGNOSIS — Z79899 Other long term (current) drug therapy: Secondary | ICD-10-CM | POA: Insufficient documentation

## 2018-05-02 DIAGNOSIS — R69 Illness, unspecified: Secondary | ICD-10-CM | POA: Diagnosis not present

## 2018-05-02 DIAGNOSIS — Z23 Encounter for immunization: Secondary | ICD-10-CM

## 2018-05-02 MED ORDER — RABIES IMMUNE GLOBULIN 150 UNIT/ML IM INJ
20.0000 [IU]/kg | INJECTION | Freq: Once | INTRAMUSCULAR | Status: AC
  Administered 2018-05-02: 1575 [IU] via INTRAMUSCULAR
  Filled 2018-05-02: qty 10.5

## 2018-05-02 MED ORDER — RABIES VACCINE, PCEC IM SUSR
1.0000 mL | Freq: Once | INTRAMUSCULAR | Status: AC
  Administered 2018-05-02: 1 mL via INTRAMUSCULAR
  Filled 2018-05-02: qty 1

## 2018-05-02 NOTE — ED Triage Notes (Signed)
Pt in c/o a rabies vaccination, pt had a cat bite on christmas day and was recommended to get the vaccination, no distress noted

## 2018-05-02 NOTE — Discharge Instructions (Signed)
Return here as needed. Follow up at our Urgent Care in 3 days for the second shot.

## 2018-05-02 NOTE — ED Notes (Signed)
ED Provider at bedside. 

## 2018-05-05 ENCOUNTER — Encounter (HOSPITAL_COMMUNITY): Payer: Self-pay | Admitting: Emergency Medicine

## 2018-05-05 ENCOUNTER — Ambulatory Visit (HOSPITAL_COMMUNITY)
Admission: EM | Admit: 2018-05-05 | Discharge: 2018-05-05 | Disposition: A | Discharge status: Home / Self Care | Payer: Medicare HMO | Attending: Family Medicine | Admitting: Family Medicine

## 2018-05-05 MED ORDER — RABIES VACCINE, PCEC IM SUSR
INTRAMUSCULAR | Status: AC
  Filled 2018-05-05: qty 1

## 2018-05-05 MED ORDER — RABIES VACCINE, PCEC IM SUSR
1.0000 mL | Freq: Once | INTRAMUSCULAR | Status: AC
  Administered 2018-05-05: 1 mL via INTRAMUSCULAR

## 2018-05-05 NOTE — ED Triage Notes (Signed)
PT was bitten by a cat. PT was seen in the ED three days ago. No negative reactions to vaccinations. Does not wish to see a provider.

## 2018-05-06 NOTE — ED Provider Notes (Signed)
[facility] EMERGENCY DEPARTMENT Provider Note   CSN: [id number] Arrival date & time: 05/02/18  1540     History   Chief Complaint Chief Complaint  Patient presents with  . Rabies Injection    HPI [name] is a 68 y.o. female.  HPI Patient presents to the emergency department with need for rabies vaccination.  The patient states she was bitten by a stray cat on December 25.  Patient states that she did see an urgent care the next day and was given antibiotics.  Patient states that nothing seems make the condition better or worse.  She states she has no symptoms at this time since it was over 10 days ago. Past Medical History:  Diagnosis Date  . Abdominal pain, epigastric   . Abdominal pain, epigastric   . Abdominal pain, right lower quadrant   . Abnormal weight gain   . Abnormality of gait   . Anxiety state, unspecified   . Asymptomatic varicose veins   . Blood vessel replaced by other means   . Cramp of limb   . Disorder of bone and cartilage, unspecified   . Dizziness and giddiness   . Edema   . Encounter for long-term (current) use of other medications   . Herpes zoster without mention of complication   . Herpes zoster without mention of complication   . Hypersomnia, unspecified   . Insomnia, unspecified   . Long term (current) use of anticoagulants   . Lumbago   . Lumbago   . Muscle weakness (generalized)   . Myalgia and myositis, unspecified   . Obesity, unspecified   . Optic neuritis   . Other abnormal blood chemistry   . Other and unspecified hyperlipidemia   . Other cataract   . Pain in joint, site unspecified   . Pain in limb   . Pain in limb   . Palpitations   . Phlebitis and thrombophlebitis of lower extremities, unspecified   . Phlebitis and thrombophlebitis of other deep vessels of lower extremities   . Rash and other nonspecific skin eruption   . Spasm of muscle   . Syncope and collapse   . Tobacco use disorder   .  Unspecified essential hypertension   . Unspecified hypothyroidism   . Unspecified sinusitis (chronic)   . Unspecified tinnitus     Patient Active Problem List   Diagnosis Date Noted  . Bilateral leg pain 02/17/2018  . Insect bite 02/16/2018  . Tick bite of back 12/31/2017  . Anxiety 12/31/2017  . Cramp of toe 07/01/2016  . Lichen sclerosus of female genitalia 05/14/2016  . Low back pain 06/04/2015  . Chronic pain 05/01/2015  . Tobacco abuse 06/04/2014  . Asthma with bronchitis 06/04/2014  . H/O cardiovascular disorder 03/17/2014  . Anterior optic neuritis 03/17/2014  . Seasonal and perennial allergic rhinitis 03/06/2014  . H/O thrombosis 12/24/2013  . Myalgia and myositis 08/23/2013  . Non-toxic uninodular goiter 03/07/2013  . Allergy 11/16/2012  . Hypothyroidism 08/31/2012  . Essential hypertension 08/31/2012  . Obstructive sleep apnea 08/31/2012    Past Surgical History:  Procedure Laterality Date  . CATARACT EXTRACTION, BILATERAL    . EYE SURGERY    . [place] IMPLANT  2008   DR DICKERSON  . HIP SURGERY LEFT  03/2010   DR [facility]  . HIP SURGERY RIGHT  06/2010   DR [facility]  . LEFT SHOULDER  2012   DR [facility]   .  LOWER BACK  05/26/2007   DR JEFF BEANE  . RIGHT BREAST BIOPSY  1986  . RIGHT KNEE  1971     OB History   No obstetric history on file.      Home Medications    Prior to Admission medications   Medication Sig Start Date End Date Taking? Authorizing Provider  aspirin 325 MG tablet Take 325 mg by mouth daily.     [provider]  Biotin 5000 MCG TABS Take by mouth daily.    [provider]  Cholecalciferol (VITAMIN D-3 PO) Take 1 tablet by mouth daily.    [provider]  Dextran 70-Hypromellose (ARTIFICIAL TEARS) 0.1-0.3 % SOLN Artificial Tears    [provider]  EPINEPHrine 0.3 mg/0.3 mL IJ SOAJ injection Inject 0.3 mLs (0.3 mg total) into the muscle as needed. 03/12/15   Lauree Chandler, NP  Polyethyl Glycol-Propyl Glycol (SYSTANE PRESERVATIVE FREE OP) Apply to eye.    [provider]  TIROSINT 75 MCG CAPS Take 1 capsule by mouth daily. 01/27/18   [provider]  triamcinolone ointment (KENALOG) 0.1 % triamcinolone acetonide 0.1 % topical ointment  APPLY A THIN LAYER TO THE AFFECTED vulvar AREA(S) BY TOPICAL ROUTE daily    [provider]    Family History Family History  Problem Relation Age of Onset  . Stroke Mother   . Stroke Father   . Heart disease Father   . Stroke Brother     Social History Social History   Tobacco Use  . Smoking status: Heavy Tobacco Smoker    Packs/day: 1.00    Types: Cigarettes  . Smokeless tobacco: Never Used  . Tobacco comment: one pack Daily Maybe more.  Substance Use Topics  . Alcohol use: Yes  . Drug use: No     Allergies   Cortizone-10 [hydrocortisone]; Shellfish allergy; Synthroid [levothyroxine sodium]; Azithromycin; Betadine [povidone iodine]; Calcium channel blockers; Codeine; Duloxetine; Fluoride preparations; Iodine; Levaquin [levofloxacin in d5w]; Lyrica [pregabalin]; Novocain [procaine]; Other; Peanuts [peanut oil]; Penicillins; Pollen extract; Prozac [fluoxetine hcl]; Wellbutrin [bupropion]; Lanolin; Molds & smuts; and Mushroom extract complex   Review of Systems Review of Systems  All other systems negative except as documented in the HPI. All pertinent positives and negatives as reviewed in the HPI.  Physical Exam Updated Vital Signs BP (!) 148/91   Pulse 79   Temp 98 F (36.7 C) (Oral)   Resp 17   Wt 79.4 kg   SpO2 98%   BMI 28.25 kg/m   Physical Exam Vitals signs and nursing note reviewed.  Constitutional:      General: She is not in acute distress.    Appearance: She is well-developed.  HENT:     Head: Normocephalic and atraumatic.  Eyes:     Pupils: Pupils are equal, round, and reactive to light.  Pulmonary:     Effort: Pulmonary effort is normal.   Skin:    General: Skin is warm and dry.  Neurological:     Mental Status: She is alert and oriented to person, place, and time.      ED Treatments / Results  Labs (all labs ordered are listed, but only abnormal results are displayed) Labs Reviewed - No data to display  EKG None  Radiology No results found.  Procedures Procedures (including critical care time)  Medications Ordered in ED Medications  rabies immune globulin (HYPERAB/KEDRAB) injection 1,575 Units (1,575 Units Intramuscular Given 05/02/18 1826)  rabies vaccine (RABAVERT) injection 1 mL (  1 mL Intramuscular Given 05/02/18 1815)     Initial Impression / Assessment and Plan / ED Course  I have reviewed the triage vital signs and the nursing notes.  Pertinent labs & imaging results that were available during my care of the patient were reviewed by me and considered in my medical decision making (see chart for details).     We will be given the rabies vaccination and immunoglobulin.  It has been 10 days since the incident and there is no signs of infection in the foot.  She was given the follow-up for the continued series. Final Clinical Impressions(s) / ED Diagnoses   Final diagnoses:  Need for prophylactic vaccination against rabies    ED Discharge Orders    None       Dalia Heading, PA-C 05/06/18 0004    Maudie Flakes, MD 05/06/18 1200

## 2018-05-09 ENCOUNTER — Ambulatory Visit (HOSPITAL_COMMUNITY)
Admission: EM | Admit: 2018-05-09 | Discharge: 2018-05-09 | Disposition: A | Discharge status: Home / Self Care | Payer: Medicare HMO | Attending: Family Medicine | Admitting: Family Medicine

## 2018-05-09 DIAGNOSIS — Z203 Contact with and (suspected) exposure to rabies: Secondary | ICD-10-CM

## 2018-05-09 DIAGNOSIS — Z23 Encounter for immunization: Secondary | ICD-10-CM | POA: Diagnosis not present

## 2018-05-09 MED ORDER — RABIES VACCINE, PCEC IM SUSR
INTRAMUSCULAR | Status: AC
  Filled 2018-05-09: qty 1

## 2018-05-09 MED ORDER — BACITRACIN ZINC 500 UNIT/GM EX OINT
TOPICAL_OINTMENT | CUTANEOUS | Status: AC
  Filled 2018-05-09: qty 28.35

## 2018-05-09 MED ORDER — RABIES VACCINE, PCEC IM SUSR
1.0000 mL | Freq: Once | INTRAMUSCULAR | Status: AC
  Administered 2018-05-09: 1 mL via INTRAMUSCULAR

## 2018-05-09 NOTE — ED Notes (Signed)
Pt was given her 2 nd rabies injection today in her right deltoid.

## 2018-05-10 ENCOUNTER — Telehealth (INDEPENDENT_AMBULATORY_CARE_PROVIDER_SITE_OTHER): Payer: Self-pay

## 2018-05-10 NOTE — Telephone Encounter (Signed)
Faxed to provided number  

## 2018-05-10 NOTE — Telephone Encounter (Signed)
[name] with [facility] would like a letter faxed stating that patient will not need pre-medication before dental appointment.  Patient had surgery with Dr. Ninfa Linden in 2011.  CB# is [phone], fax# [phone] attn: [name].  Please advise.  Thank you.

## 2018-05-16 ENCOUNTER — Encounter (HOSPITAL_COMMUNITY): Payer: Self-pay | Admitting: Emergency Medicine

## 2018-05-16 ENCOUNTER — Ambulatory Visit (HOSPITAL_COMMUNITY)
Admission: EM | Admit: 2018-05-16 | Discharge: 2018-05-16 | Disposition: A | Discharge status: Home / Self Care | Payer: Medicare HMO | Attending: Family Medicine | Admitting: Family Medicine

## 2018-05-16 DIAGNOSIS — Z203 Contact with and (suspected) exposure to rabies: Secondary | ICD-10-CM | POA: Diagnosis not present

## 2018-05-16 DIAGNOSIS — Z23 Encounter for immunization: Secondary | ICD-10-CM

## 2018-05-16 MED ORDER — RABIES VACCINE, PCEC IM SUSR
INTRAMUSCULAR | Status: AC
  Filled 2018-05-16: qty 1

## 2018-05-16 MED ORDER — RABIES VACCINE, PCEC IM SUSR
1.0000 mL | Freq: Once | INTRAMUSCULAR | Status: AC
  Administered 2018-05-16: 1 mL via INTRAMUSCULAR

## 2018-05-16 NOTE — ED Triage Notes (Signed)
Pt here for 4th rabies injection; given in left arm

## 2018-05-17 DIAGNOSIS — R69 Illness, unspecified: Secondary | ICD-10-CM | POA: Diagnosis not present

## 2018-05-18 ENCOUNTER — Other Ambulatory Visit: Payer: Self-pay | Admitting: Endocrinology

## 2018-05-18 DIAGNOSIS — E049 Nontoxic goiter, unspecified: Secondary | ICD-10-CM

## 2018-05-31 ENCOUNTER — Other Ambulatory Visit: Payer: Medicare HMO

## 2018-06-01 ENCOUNTER — Encounter: Payer: Self-pay | Admitting: Family Medicine

## 2018-06-01 ENCOUNTER — Ambulatory Visit (INDEPENDENT_AMBULATORY_CARE_PROVIDER_SITE_OTHER): Payer: Medicare HMO | Admitting: Family Medicine

## 2018-06-01 ENCOUNTER — Ambulatory Visit (INDEPENDENT_AMBULATORY_CARE_PROVIDER_SITE_OTHER): Payer: Medicare HMO

## 2018-06-01 VITALS — BP 126/68 | HR 66 | Temp 97.9°F | Ht 66.0 in | Wt 174.0 lb

## 2018-06-01 DIAGNOSIS — R05 Cough: Secondary | ICD-10-CM

## 2018-06-01 DIAGNOSIS — J40 Bronchitis, not specified as acute or chronic: Secondary | ICD-10-CM

## 2018-06-01 DIAGNOSIS — R0602 Shortness of breath: Secondary | ICD-10-CM | POA: Diagnosis not present

## 2018-06-01 DIAGNOSIS — R059 Cough, unspecified: Secondary | ICD-10-CM

## 2018-06-01 MED ORDER — DOXYCYCLINE HYCLATE 100 MG PO TABS: 100.0000 mg | ORAL_TABLET | Freq: Two times a day (BID) | ORAL | 0 refills | Status: DC

## 2018-06-01 NOTE — Patient Instructions (Signed)
Acute Bronchitis, Adult Acute bronchitis is when air tubes (bronchi) in the lungs suddenly get swollen. The condition can make it hard to breathe. It can also cause these symptoms:  A cough.  Coughing up clear, yellow, or green mucus.  Wheezing.  Chest congestion.  Shortness of breath.  A fever.  Body aches.  Chills.  A sore throat. Follow these instructions at home:  Medicines  Take over-the-counter and prescription medicines only as told by your doctor.  If you were prescribed an antibiotic medicine, take it as told by your doctor. Do not stop taking the antibiotic even if you start to feel better. General instructions  Rest.  Drink enough fluids to keep your pee (urine) pale yellow.  Avoid smoking and secondhand smoke. If you smoke and you need help quitting, ask your doctor. Quitting will help your lungs heal faster.  Use an inhaler, cool mist vaporizer, or humidifier as told by your doctor.  Keep all follow-up visits as told by your doctor. This is important. How is this prevented? To lower your risk of getting this condition again:  Wash your hands often with soap and water. If you cannot use soap and water, use hand sanitizer.  Avoid contact with people who have cold symptoms.  Try not to touch your hands to your mouth, nose, or eyes.  Make sure to get the flu shot every year. Contact a doctor if:  Your symptoms do not get better in 2 weeks. Get help right away if:  You cough up blood.  You have chest pain.  You have very bad shortness of breath.  You become dehydrated.  You faint (pass out) or keep feeling like you are going to pass out.  You keep throwing up (vomiting).  You have a very bad headache.  Your fever or chills gets worse. This information is not intended to replace advice given to you by your health care provider. Make sure you discuss any questions you have with your health care provider. Document Released: 09/30/2007 Document  Revised: 11/25/2016 Document Reviewed: 10/02/2015 Elsevier Interactive Patient Education  2019 [employer]  

## 2018-06-01 NOTE — Assessment & Plan Note (Signed)
-  CXR ordered -May have COPD component as well with long term smoking.   -Start doxycycline.  Recommended short course of steroids and albuterol inhaler however she declines.  -Discussed red flags, recommend follow up if continuing to worsen.

## 2018-06-01 NOTE — Progress Notes (Signed)
[name] - 68 y.o. female MRN [id number]  Date of birth: [birth year]  Subjective Chief Complaint  Patient presents with  . Cough    ongoing for three months-denies body aches or fevers Admits to SOB and productive cough    HPI Millette K Elmquist is a 68 y.o. female with history of asthma and nicotine dependence here today with c/o cough and shortness of breath.  She reports that symptoms have been ongoing for about 2-3 weeks without improvement.  Cough productive of clear sputum.  She also has had associated left ear pain, body aches, chills, congestion and red painful area above left ankle.  She has not checked for fever at home.  She denies nausea, vomiting, diarrhea, chest pain.  ROS:  A comprehensive ROS was completed and negative except as noted per HPI  Allergies  Allergen Reactions  . Cortizone-10 [Hydrocortisone] Hives  . Shellfish Allergy Other (See Comments) and Anaphylaxis    Death  . Synthroid [Levothyroxine Sodium] Anaphylaxis    Internal shaking   . Azithromycin Hives  . Betadine [Povidone Iodine]     Aspiration   . Calcium Channel Blockers     Asthma  . Codeine     Flu-like symptoms   . Duloxetine Itching and Nausea Only    Dry mouth  . Fluoride Preparations Other (See Comments)    Blisters  . Iodine Other (See Comments)    Aspiration   . Levaquin [Levofloxacin In D5w]   . Lyrica [Pregabalin] Other (See Comments)    Make her very mean and angery  . Novocain [Procaine] Other (See Comments)    Heart Palpations  . Other     Beans,carbonation, dust and mold,floride,fried foods, household cleaner, melon,mushrooms,peppers,preservatives,rasisns,spicey,spinach,trees,milk products,shellfish  . Peanuts [Peanut Oil] Swelling    **Allergy to ALL NUTS** Face Swelling   . Penicillins   . Pollen Extract Other (See Comments)    Hemorrhage   . Prozac [Fluoxetine Hcl]   . Wellbutrin [Bupropion] Hives    After 1 pill fist size bumps appeared  . Lanolin Rash  . Molds &  Smuts Other (See Comments)  . Mushroom Extract Complex Nausea And Vomiting    Past Medical History:  Diagnosis Date  . Abdominal pain, epigastric   . Abdominal pain, epigastric   . Abdominal pain, right lower quadrant   . Abnormal weight gain   . Abnormality of gait   . Anxiety state, unspecified   . Asymptomatic varicose veins   . Blood vessel replaced by other means   . Cramp of limb   . Disorder of bone and cartilage, unspecified   . Dizziness and giddiness   . Edema   . Encounter for long-term (current) use of other medications   . Herpes zoster without mention of complication   . Herpes zoster without mention of complication   . Hypersomnia, unspecified   . Insomnia, unspecified   . Long term (current) use of anticoagulants   . Lumbago   . Lumbago   . Muscle weakness (generalized)   . Myalgia and myositis, unspecified   . Obesity, unspecified   . Optic neuritis   . Other abnormal blood chemistry   . Other and unspecified hyperlipidemia   . Other cataract   . Pain in joint, site unspecified   . Pain in limb   . Pain in limb   . Palpitations   . Phlebitis and thrombophlebitis of lower extremities, unspecified   . Phlebitis and thrombophlebitis of other deep vessels of  lower extremities   . Rash and other nonspecific skin eruption   . Spasm of muscle   . Syncope and collapse   . Tobacco use disorder   . Unspecified essential hypertension   . Unspecified hypothyroidism   . Unspecified sinusitis (chronic)   . Unspecified tinnitus     Past Surgical History:  Procedure Laterality Date  . CATARACT EXTRACTION, BILATERAL    . EYE SURGERY    . [place] IMPLANT  2008   DR DICKERSON  . HIP SURGERY LEFT  03/2010   DR [facility]  . HIP SURGERY RIGHT  06/2010   DR [facility]  . LEFT SHOULDER  2012   DR [facility]   . LOWER BACK  05/26/2007   DR JEFF BEANE  . RIGHT BREAST BIOPSY  1986  . RIGHT KNEE  1971    Social History   Socioeconomic  History  . Marital status: Widowed    Spouse name: [name]  . Number of children: 0  . Years of education: college  . Highest education level: Not on file  Occupational History    Employer: [employer]    Comment: answers phones  Social Needs  . Financial resource strain: Not hard at all  . Food insecurity:    Worry: Never true    Inability: Never true  . Transportation needs:    Medical: No    Non-medical: No  Tobacco Use  . Smoking status: Heavy Tobacco Smoker    Packs/day: 1.00    Types: Cigarettes  . Smokeless tobacco: Never Used  . Tobacco comment: one pack Daily Maybe more.  Substance and Sexual Activity  . Alcohol use: Yes  . Drug use: No  . Sexual activity: Not Currently    Partners: Male    Comment: husband  Lifestyle  . Physical activity:    Days per week: 1 day    Minutes per session: 30 min  . Stress: Very much  Relationships  . Social connections:    Talks on phone: More than three times a week    Gets together: Once a week    Attends religious service: Never    Active member of club or organization: No    Attends meetings of clubs or organizations: Never    Relationship status: Widowed  Other Topics Concern  . Not on file  Social History Narrative   Patient  Lives at home wit her husband [name]) . Patient still works.college education.   Right handed.   Caffiene -None    Family History  Problem Relation Age of Onset  . Stroke Mother   . Stroke Father   . Heart disease Father   . Stroke Brother     Health Maintenance  Topic Date Due  . DEXA SCAN  01/03/2015  . PNA vac Low Risk Adult (2 of 2 - PPSV23) 04/27/2017  . MAMMOGRAM  02/09/2019 (Originally 01/03/2000)  . PAP SMEAR-Modifier  01/09/2019  . Fecal DNA (Cologuard)  10/03/2020  . TETANUS/TDAP  02/04/2024  . INFLUENZA VACCINE  Completed  . Hepatitis C Screening  Completed     ----------------------------------------------------------------------------------------------------------------------------------------------------------------------------------------------------------------- Physical Exam BP 126/68   Pulse 66   Temp 97.9 F (36.6 C) (Oral)   Ht 5\' 6"  (1.676 m)   Wt 174 lb (78.9 kg)   SpO2 97%   BMI 28.08 kg/m   Physical Exam Constitutional:      Appearance: Normal appearance.  HENT:     Head: Normocephalic and atraumatic.  Right Ear: Tympanic membrane normal.     Left Ear: Tympanic membrane normal.     Nose: Congestion present.     Mouth/Throat:     Mouth: Mucous membranes are moist.  Eyes:     General: No scleral icterus. Neck:     Musculoskeletal: Neck supple.  Cardiovascular:     Rate and Rhythm: Normal rate and regular rhythm.  Pulmonary:     Effort: Pulmonary effort is normal.     Breath sounds: Wheezing (mild expiratory) present.  Skin:    General: Skin is warm and dry.  Neurological:     General: No focal deficit present.     Mental Status: She is alert.  Psychiatric:        Mood and Affect: Mood normal.        Behavior: Behavior normal.     ------------------------------------------------------------------------------------------------------------------------------------------------------------------------------------------------------------------- Assessment and Plan  Asthma with bronchitis -CXR ordered -May have COPD component as well with long term smoking.   -Start doxycycline.  Recommended short course of steroids and albuterol inhaler however she declines.  -Discussed red flags, recommend follow up if continuing to worsen.

## 2018-06-02 NOTE — Progress Notes (Signed)
CXR shows changes consistent with bronchitis.  No pneumonia seen.  Hope she feels better soon!

## 2018-06-07 DIAGNOSIS — E039 Hypothyroidism, unspecified: Secondary | ICD-10-CM | POA: Diagnosis not present

## 2018-06-08 ENCOUNTER — Ambulatory Visit
Admission: RE | Admit: 2018-06-08 | Discharge: 2018-06-08 | Disposition: A | Discharge status: Home / Self Care | Payer: Medicare HMO | Source: Ambulatory Visit | Attending: Endocrinology | Admitting: Endocrinology

## 2018-06-08 DIAGNOSIS — E042 Nontoxic multinodular goiter: Secondary | ICD-10-CM | POA: Diagnosis not present

## 2018-06-08 DIAGNOSIS — E049 Nontoxic goiter, unspecified: Secondary | ICD-10-CM

## 2018-06-14 DIAGNOSIS — E049 Nontoxic goiter, unspecified: Secondary | ICD-10-CM | POA: Diagnosis not present

## 2018-06-14 DIAGNOSIS — E039 Hypothyroidism, unspecified: Secondary | ICD-10-CM | POA: Diagnosis not present

## 2018-06-21 ENCOUNTER — Ambulatory Visit (INDEPENDENT_AMBULATORY_CARE_PROVIDER_SITE_OTHER): Payer: Medicare HMO

## 2018-06-21 ENCOUNTER — Ambulatory Visit (INDEPENDENT_AMBULATORY_CARE_PROVIDER_SITE_OTHER): Payer: Medicare HMO | Admitting: Family Medicine

## 2018-06-21 ENCOUNTER — Encounter: Payer: Self-pay | Admitting: Family Medicine

## 2018-06-21 VITALS — BP 132/70 | HR 88 | Temp 98.0°F | Ht 66.0 in | Wt 174.8 lb

## 2018-06-21 DIAGNOSIS — M79601 Pain in right arm: Secondary | ICD-10-CM | POA: Insufficient documentation

## 2018-06-21 DIAGNOSIS — M25511 Pain in right shoulder: Secondary | ICD-10-CM | POA: Diagnosis not present

## 2018-06-21 NOTE — Progress Notes (Signed)
[name] - 68 y.o. female MRN [id number]  Date of birth: 03-12-1951  SUBJECTIVE:  Including CC & ROS.  Chief Complaint  Patient presents with  . Arm Pain    right arm pain/ trunk fell on arm 3 days ago/ hurts with movement    [name] is a 68 y.o. female that is presenting with right upper arm pain.  She was getting into her truck and the wind blew down onto her arm.  She has had bruising and pain on the lateral upper arm.  She had something similar happen to her last year.  She has a divot in her arm from where that occurred.  She still is able to move her shoulder.  Only certain positions cause pain.  Is intermittent in nature.  Has not taken anything for the pain.   Review of Systems  Constitutional: Negative for fever.  HENT: Negative for congestion.   Respiratory: Negative for cough.   Cardiovascular: Negative for chest pain.  Gastrointestinal: Negative for abdominal pain.  Musculoskeletal: Positive for arthralgias.  Skin: Positive for color change.  Neurological: Negative for weakness.  Hematological: Negative for adenopathy.  Psychiatric/Behavioral: Negative for agitation.    HISTORY: Past Medical, Surgical, Social, and Family History Reviewed & Updated per EMR.   Pertinent Historical Findings include:  Past Medical History:  Diagnosis Date  . Abdominal pain, epigastric   . Abdominal pain, epigastric   . Abdominal pain, right lower quadrant   . Abnormal weight gain   . Abnormality of gait   . Anxiety state, unspecified   . Asymptomatic varicose veins   . Blood vessel replaced by other means   . Cramp of limb   . Disorder of bone and cartilage, unspecified   . Dizziness and giddiness   . Edema   . Encounter for long-term (current) use of other medications   . Herpes zoster without mention of complication   . Herpes zoster without mention of complication   . Hypersomnia, unspecified   . Insomnia, unspecified   . Long term (current) use of  anticoagulants   . Lumbago   . Lumbago   . Muscle weakness (generalized)   . Myalgia and myositis, unspecified   . Obesity, unspecified   . Optic neuritis   . Other abnormal blood chemistry   . Other and unspecified hyperlipidemia   . Other cataract   . Pain in joint, site unspecified   . Pain in limb   . Pain in limb   . Palpitations   . Phlebitis and thrombophlebitis of lower extremities, unspecified   . Phlebitis and thrombophlebitis of other deep vessels of lower extremities   . Rash and other nonspecific skin eruption   . Spasm of muscle   . Syncope and collapse   . Tobacco use disorder   . Unspecified essential hypertension   . Unspecified hypothyroidism   . Unspecified sinusitis (chronic)   . Unspecified tinnitus     Past Surgical History:  Procedure Laterality Date  . CATARACT EXTRACTION, BILATERAL    . EYE SURGERY    . [place] IMPLANT  2008   DR DICKERSON  . HIP SURGERY LEFT  03/2010   DR [facility]  . HIP SURGERY RIGHT  06/2010   DR [facility]  . LEFT SHOULDER  2012   DR [facility]   . LOWER BACK  05/26/2007   DR JEFF BEANE  . RIGHT BREAST BIOPSY  1986  . [place]  Allergies  Allergen Reactions  . Cortizone-10 [Hydrocortisone] Hives  . Shellfish Allergy Other (See Comments) and Anaphylaxis    Death  . Synthroid [Levothyroxine Sodium] Anaphylaxis    Internal shaking   . Azithromycin Hives  . Betadine [Povidone Iodine]     Aspiration   . Calcium Channel Blockers     Asthma  . Codeine     Flu-like symptoms   . Duloxetine Itching and Nausea Only    Dry mouth  . Fluoride Preparations Other (See Comments)    Blisters  . Iodine Other (See Comments)    Aspiration   . Levaquin [Levofloxacin In D5w]   . Lyrica [Pregabalin] Other (See Comments)    Make her very mean and angery  . Novocain [Procaine] Other (See Comments)    Heart Palpations  . Other     Beans,carbonation, dust and mold,floride,fried foods, household  cleaner, melon,mushrooms,peppers,preservatives,rasisns,spicey,spinach,trees,milk products,shellfish  . Peanuts [Peanut Oil] Swelling    **Allergy to ALL NUTS** Face Swelling   . Penicillins   . Pollen Extract Other (See Comments)    Hemorrhage   . Prozac [Fluoxetine Hcl]   . Wellbutrin [Bupropion] Hives    After 1 pill fist size bumps appeared  . Lanolin Rash  . Molds & Smuts Other (See Comments)  . Mushroom Extract Complex Nausea And Vomiting    Family History  Problem Relation Age of Onset  . Stroke Mother   . Stroke Father   . Heart disease Father   . Stroke Brother      Social History   Socioeconomic History  . Marital status: Widowed    Spouse name: [name]  . Number of children: 0  . Years of education: college  . Highest education level: Not on file  Occupational History    Employer: [employer]    Comment: answers phones  Social Needs  . Financial resource strain: Not hard at all  . Food insecurity:    Worry: Never true    Inability: Never true  . Transportation needs:    Medical: No    Non-medical: No  Tobacco Use  . Smoking status: Heavy Tobacco Smoker    Packs/day: 1.00    Types: Cigarettes  . Smokeless tobacco: Never Used  . Tobacco comment: one pack Daily Maybe more.  Substance and Sexual Activity  . Alcohol use: Yes  . Drug use: No  . Sexual activity: Not Currently    Partners: Male    Comment: husband  Lifestyle  . Physical activity:    Days per week: 1 day    Minutes per session: 30 min  . Stress: Very much  Relationships  . Social connections:    Talks on phone: More than three times a week    Gets together: Once a week    Attends religious service: Never    Active member of club or organization: No    Attends meetings of clubs or organizations: Never    Relationship status: Widowed  . Intimate partner violence:    Fear of current or ex partner: No    Emotionally abused: No    Physically abused: No    Forced sexual activity: No   Other Topics Concern  . Not on file  Social History Narrative   Patient  Lives at home wit her husband [name]) . Patient still works.college education.   Right handed.   Caffiene -None     PHYSICAL EXAM:  VS: BP 132/70   Pulse 88   Temp 98 F (  36.7 C) (Oral)   Ht 5\' 6"  (1.676 m)   Wt 174 lb 12.8 oz (79.3 kg)   SpO2 99%   BMI 28.21 kg/m  Physical Exam Gen: NAD, alert, cooperative with exam, well-appearing ENT: normal lips, normal nasal mucosa,  Eye: normal EOM, normal conjunctiva and lids CV:  no edema, +2 pedal pulses   Resp: no accessory muscle use, non-labored,  Skin: no rashes, no areas of induration  Neuro: normal tone, normal sensation to touch Psych:  normal insight, alert and oriented MSK:  Right arm/shoulder: Ecchymosis occurring at the midshaft of the humerus on the lateral portion. Normal range of motion. Normal strength resistance. Normal empty can testing. Normal grip strength. Neurovascular intact  Limited ultrasound: Right upper arm:  Normal-appearing deltoid  Supraspinatus with chronic changes but no acute findings. Tissue under the bruise shows no hematoma and a heterogenous change to suggest a trauma. No fracture appreciated  Summary: Soft tissue swelling and changes secondary to trauma appreciated.  Ultrasound and interpretation by Clearance Coots, MD      ASSESSMENT & PLAN:   Right arm pain Pain related to the trauma of the trunk.  No hematoma or fracture appreciated today. -X-ray. -Counseled on supportive care.

## 2018-06-21 NOTE — Patient Instructions (Signed)
Nice to meet you  Please try warmth on the area  Please use tylenol for pain  I will call with the results from today  Please see me back in 2-3 weeks if no better

## 2018-06-21 NOTE — Assessment & Plan Note (Signed)
Pain related to the trauma of the trunk.  No hematoma or fracture appreciated today. -X-ray. -Counseled on supportive care.

## 2018-06-22 ENCOUNTER — Telehealth: Payer: Self-pay | Admitting: Family Medicine

## 2018-06-22 NOTE — Telephone Encounter (Signed)
Unable to leave VM. If she calls back please have her speak with a nurse/CMA and inform that her xray doesn't demonstrate a fracture. The [facility] can report results to patient.   If any questions then please take the best time and phone number to call and I will try to call her back.   Rosemarie Ax, MD [facility] 06/22/2018, 3:08 PM

## 2018-08-01 ENCOUNTER — Telehealth: Payer: Self-pay | Admitting: Family Medicine

## 2018-08-01 NOTE — Telephone Encounter (Signed)
Pt called an l/m saying that she was seen about a month and treated for bronchitis , however she still as a cough that will not go away and worse at night. She wants to know if can have something for this. Please advise.

## 2018-08-02 ENCOUNTER — Encounter: Payer: Self-pay | Admitting: Family Medicine

## 2018-08-02 ENCOUNTER — Other Ambulatory Visit: Payer: Self-pay | Admitting: Family Medicine

## 2018-08-02 MED ORDER — BENZONATATE 100 MG PO CAPS: 100.0000 mg | ORAL_CAPSULE | Freq: Three times a day (TID) | ORAL | 0 refills | Status: DC | PRN

## 2018-08-02 NOTE — Telephone Encounter (Signed)
I have sent tessalon in.  Please let her know that cough following bronchitis can last 6-8 weeks after illness resolves, and may last even longer in smokers.

## 2018-08-02 NOTE — Telephone Encounter (Signed)
Called Pt . She is aware  Of Rx and lasting of cough. Pt did smoke for 5 days , is trying to quit smoking .

## 2018-08-03 NOTE — Telephone Encounter (Signed)
Of the things she has at home I would recommend the delsym.

## 2018-09-20 DIAGNOSIS — R69 Illness, unspecified: Secondary | ICD-10-CM | POA: Diagnosis not present

## 2018-10-19 ENCOUNTER — Telehealth: Payer: Self-pay | Admitting: Family Medicine

## 2018-10-19 NOTE — Telephone Encounter (Signed)
Pt. Requesting to have COVID 19 test prior to going to a wedding in [place] in July. Warm transfer to [facility] in the practice.

## 2018-10-19 NOTE — Telephone Encounter (Signed)
Pt wants to get tested for covid, she said she doesn't have any symptoms but she is going to a wedding July 12th and she wanted to know for sure before then if she might have it because a friend that is going to it as well had part of her lung removed and she wants to make sure she doesn't have the chance of giving it to her, does she need an appointment?

## 2018-10-20 NOTE — Telephone Encounter (Signed)
Would not recommend testing unless she is symptomatic or has known exposure.  Even if she were to be tested now and test negative,  the event she is attending is over two weeks away and she could potentially be exposed during that time frame.

## 2018-10-20 NOTE — Telephone Encounter (Signed)
Sent MyChart message, will try to call Pt in a moment. Called Pt . She was adament about "getting testing completed. Trying to protect her friend that has one lung." Directed her to [place] and the local news for testing sites. Pt thanked me for assisting her.Call completed.

## 2018-10-24 DIAGNOSIS — H4720 Unspecified optic atrophy: Secondary | ICD-10-CM | POA: Diagnosis not present

## 2018-10-24 DIAGNOSIS — H26493 Other secondary cataract, bilateral: Secondary | ICD-10-CM | POA: Diagnosis not present

## 2018-10-31 DIAGNOSIS — U071 COVID-19: Secondary | ICD-10-CM | POA: Diagnosis not present

## 2018-10-31 DIAGNOSIS — Z20828 Contact with and (suspected) exposure to other viral communicable diseases: Secondary | ICD-10-CM | POA: Diagnosis not present

## 2018-11-16 DIAGNOSIS — R69 Illness, unspecified: Secondary | ICD-10-CM | POA: Diagnosis not present

## 2018-11-21 DIAGNOSIS — H26493 Other secondary cataract, bilateral: Secondary | ICD-10-CM | POA: Diagnosis not present

## 2018-12-15 ENCOUNTER — Telehealth: Payer: Self-pay | Admitting: Family Medicine

## 2018-12-15 NOTE — Telephone Encounter (Signed)
Dr. Rodman Key please advise, message just came through and the pt has an appt today.    Copied from Dewy Rose [id number]. Topic: General - Other >> Dec 15, 2018  1:23 PM Celene Kras A wrote: Reason for CRM: Linus Orn, from dentist office, called and is requesting verbal okay to do dental extraction on pt as pt takes asprin. Pt has appt at 3pm on 12/15/2018. She states they can be faxed if that is easier. Please advise.   Fax # [phone]

## 2018-12-15 NOTE — Telephone Encounter (Signed)
Dentist office is aware, they she said that they are going to fax over clearance form as well (they need something in witting too) and if we can return the form by 3 today will be good. FYI--Dr. Loletha Grayer

## 2018-12-15 NOTE — Telephone Encounter (Signed)
Form signed and faxed

## 2018-12-15 NOTE — Telephone Encounter (Signed)
Reviewed chart and pt is on aspirin 325mg  daily. I do believe it is ok from a medical standpint to proceed with planned procedure. Ok to give verbal order.

## 2018-12-15 NOTE — Telephone Encounter (Signed)
Dr. Loletha Grayer please advise in Dr. Zigmund Daniel absence today.

## 2018-12-16 NOTE — Telephone Encounter (Signed)
Thanks Mary.

## 2018-12-20 ENCOUNTER — Telehealth: Payer: Self-pay | Admitting: Family Medicine

## 2018-12-20 NOTE — Telephone Encounter (Signed)
I called and left message to call office and schedule follow up appointment with Dr. Zigmund Daniel.

## 2019-01-04 DIAGNOSIS — L821 Other seborrheic keratosis: Secondary | ICD-10-CM | POA: Diagnosis not present

## 2019-01-04 DIAGNOSIS — L720 Epidermal cyst: Secondary | ICD-10-CM | POA: Diagnosis not present

## 2019-01-04 DIAGNOSIS — L570 Actinic keratosis: Secondary | ICD-10-CM | POA: Diagnosis not present

## 2019-01-15 ENCOUNTER — Encounter: Payer: Self-pay | Admitting: Family Medicine

## 2019-01-17 NOTE — Progress Notes (Signed)
Virtual Visit via Video Note  I connected with patient on 01/18/19 at  3:15 PM EDT by audio enabled telemedicine application and verified that I am speaking with the correct person using two identifiers.   THIS ENCOUNTER IS A VIRTUAL VISIT DUE TO COVID-19 - PATIENT WAS NOT SEEN IN THE OFFICE. PATIENT HAS CONSENTED TO VIRTUAL VISIT / TELEMEDICINE VISIT   Location of patient: home  Location of provider: office  I discussed the limitations of evaluation and management by telemedicine and the availability of in person appointments. The patient expressed understanding and agreed to proceed.   Subjective:   [name] is a 68 y.o. female who presents for Medicare Annual (Subsequent) preventive examination.  Review of Systems: No ROS.  Medicare Wellness Virtual Visit.  Visual/audio telehealth visit, UTA vital signs.   See social history for additional risk factors. Cardiac Risk Factors include: advanced age (>51men, >41 women);smoking/ tobacco exposure;hypertension  Home Safety/Smoke Alarms: Feels safe in home. Smoke alarms in place.  Lives alone with 2 dogs. 1 story house.    Female:   Pap- 11/19/14      Mammo-  Declines.     Dexa scan- discuss with PCP       CCS- Cologuard  10/03/17- neg     Objective:     Vitals: Unable to assess. This visit is enabled though telemedicine due to Covid 19.   Advanced Directives 01/18/2019 12/25/2016 07/01/2016 02/26/2016 12/31/2015 08/07/2015 07/02/2015  Does Patient Have a Medical Advance Directive? Yes No Yes Yes Yes No Yes  Type of Paramedic of [place];Living will - Healthcare Power of [place];Living will - [place]  Does patient want to make changes to medical advance directive? No - Patient declined - - - - - No - Patient declined  Copy of [place] in Chart? No - copy requested - Yes No - copy requested No - copy requested -  No - copy requested  Would patient like information on creating a medical advance directive? - - - - - No - patient declined information -    Tobacco Social History   Tobacco Use  Smoking Status Heavy Tobacco Smoker  . Packs/day: 1.00  . Types: Cigarettes  Smokeless Tobacco Never Used  Tobacco Comment   one pack Daily Maybe more.     Ready to quit: Not Answered Counseling given: Not Answered Comment: one pack Daily Maybe more.   Clinical Intake: Pain : No/denies pain    Past Medical History:  Diagnosis Date  . Abdominal pain, epigastric   . Abdominal pain, epigastric   . Abdominal pain, right lower quadrant   . Abnormal weight gain   . Abnormality of gait   . Anxiety state, unspecified   . Asymptomatic varicose veins   . Blood vessel replaced by other means   . Cramp of limb   . Disorder of bone and cartilage, unspecified   . Dizziness and giddiness   . Edema   . Encounter for long-term (current) use of other medications   . Herpes zoster without mention of complication   . Herpes zoster without mention of complication   . Hypersomnia, unspecified   . Insomnia, unspecified   . Long term (current) use of anticoagulants   . Lumbago   . Lumbago   . Muscle weakness (generalized)   . Myalgia and myositis, unspecified   . Obesity, unspecified   . Optic neuritis   .  Other abnormal blood chemistry   . Other and unspecified hyperlipidemia   . Other cataract   . Pain in joint, site unspecified   . Pain in limb   . Pain in limb   . Palpitations   . Phlebitis and thrombophlebitis of lower extremities, unspecified   . Phlebitis and thrombophlebitis of other deep vessels of lower extremities   . Rash and other nonspecific skin eruption   . Spasm of muscle   . Syncope and collapse   . Tobacco use disorder   . Unspecified essential hypertension   . Unspecified hypothyroidism   . Unspecified sinusitis (chronic)   . Unspecified tinnitus    Past Surgical History:   Procedure Laterality Date  . CATARACT EXTRACTION, BILATERAL    . EYE SURGERY     zag laser procedure.  Marland Kitchen [place] IMPLANT  2008   DR DICKERSON  . HIP SURGERY LEFT  03/2010   DR [facility]  . HIP SURGERY RIGHT  06/2010   DR [facility]  . LEFT SHOULDER  2012   DR [facility]   . LOWER BACK  05/26/2007   DR JEFF BEANE  . RIGHT BREAST BIOPSY  1986  . RIGHT KNEE  1971   Family History  Problem Relation Age of Onset  . Stroke Mother   . Stroke Father   . Heart disease Father   . Stroke Brother    Social History   Socioeconomic History  . Marital status: Widowed    Spouse name: [name]  . Number of children: 0  . Years of education: college  . Highest education level: Not on file  Occupational History    Employer: [employer]    Comment: answers phones  Social Needs  . Financial resource strain: Not hard at all  . Food insecurity    Worry: Never true    Inability: Never true  . Transportation needs    Medical: No    Non-medical: No  Tobacco Use  . Smoking status: Heavy Tobacco Smoker    Packs/day: 1.00    Types: Cigarettes  . Smokeless tobacco: Never Used  . Tobacco comment: one pack Daily Maybe more.  Substance and Sexual Activity  . Alcohol use: Yes  . Drug use: No  . Sexual activity: Not Currently    Partners: Male    Comment: husband  Lifestyle  . Physical activity    Days per week: 1 day    Minutes per session: 30 min  . Stress: Very much  Relationships  . Social connections    Talks on phone: More than three times a week    Gets together: Once a week    Attends religious service: Never    Active member of club or organization: No    Attends meetings of clubs or organizations: Never    Relationship status: Widowed  Other Topics Concern  . Not on file  Social History Narrative   Patient  Lives at home wit her husband [name]) . Patient still works.college education.   Right handed.   Caffiene -None    Outpatient Encounter  Medications as of 01/18/2019  Medication Sig  . aspirin 325 MG tablet Take 325 mg by mouth daily.   . Biotin 5000 MCG TABS Take by mouth daily.  . Cholecalciferol (VITAMIN D-3 PO) Take 1 tablet by mouth daily.  Marland Kitchen Dextran 70-Hypromellose (ARTIFICIAL TEARS) 0.1-0.3 % SOLN Artificial Tears  . EPINEPHrine 0.3 mg/0.3 mL IJ SOAJ injection Inject 0.3 mLs (0.3 mg  total) into the muscle as needed.  Vladimir Faster Glycol-Propyl Glycol (SYSTANE PRESERVATIVE FREE OP) Apply to eye.  Marland Kitchen TIROSINT 75 MCG CAPS Take 1 capsule by mouth daily.  Marland Kitchen triamcinolone ointment (KENALOG) 0.1 % triamcinolone acetonide 0.1 % topical ointment  APPLY A THIN LAYER TO THE AFFECTED vulvar AREA(S) BY TOPICAL ROUTE daily  . [DISCONTINUED] benzonatate (TESSALON) 100 MG capsule Take 1-2 capsules (100-200 mg total) by mouth 3 (three) times daily as needed for cough.  . [DISCONTINUED] doxycycline (VIBRA-TABS) 100 MG tablet Take 1 tablet (100 mg total) by mouth 2 (two) times daily.   No facility-administered encounter medications on file as of 01/18/2019.     Activities of Daily Living In your present state of health, do you have any difficulty performing the following activities: 01/18/2019  Hearing? N  Vision? N  Difficulty concentrating or making decisions? N  Walking or climbing stairs? N  Dressing or bathing? N  Doing errands, shopping? N  Preparing Food and eating ? N  Using the Toilet? N  In the past six months, have you accidently leaked urine? N  Do you have problems with loss of bowel control? N  Managing your Medications? N  Managing your Finances? N  Housekeeping or managing your Housekeeping? N  Some recent data might be hidden    Patient Care Team: Luetta Nutting, DO as PCP - General (Family Medicine) Mcarthur Rossetti, MD as Attending Physician (Orthopedic Surgery) Jacelyn Pi, MD as Consulting Physician (Endocrinology) Jerelyn Charles, MD as Consulting Physician (Gynecology)    Assessment:   This is  a routine wellness examination for [name]. Physical assessment deferred to PCP.  Exercise Activities and Dietary recommendations Current Exercise Habits: Home exercise routine, Type of exercise: walking, Time (Minutes): 30, Frequency (Times/Week): 7, Weekly Exercise (Minutes/Week): 210, Intensity: Mild, Exercise limited by: None identified   Diet (meal preparation, eat out, water intake, caffeinated beverages, dairy products, fruits and vegetables): well balanced, on average, 2-3 meals per day    Goals    . Quit smoking / using tobacco     Starting 02/26/16, I will attempt to quit smoking.       Fall Risk Fall Risk  01/18/2019 12/31/2017 06/24/2017 12/25/2016 02/26/2016  Falls in the past year? 0 No No No No  Number falls in past yr: - - - - -  Injury with Fall? - - - - -  Comment - - - - -   Depression Screen PHQ 2/9 Scores 01/18/2019 12/31/2017 12/31/2017 06/24/2017  PHQ - 2 Score 1 3 0 0  PHQ- 9 Score - 16 - -     Cognitive Function Ad8 score reviewed for issues:  Issues making decisions:no  Less interest in hobbies / activities:no  Repeats questions, stories (family complaining):no  Trouble using ordinary gadgets (microwave, computer, phone):no  Forgets the month or year: no  Mismanaging finances: no  Remembering appts:no  Daily problems with thinking and/or memory:no Ad8 score is=0     MMSE - Mini Mental State Exam 06/24/2017 02/26/2016  Orientation to time 5 5  Orientation to Place 5 5  Registration 3 3  Attention/ Calculation 5 5  Recall 3 3  Language- name 2 objects 2 2  Language- repeat 1 1  Language- follow 3 step command 3 3  Language- read & follow direction 1 1  Write a sentence 1 1  Copy design 1 1  Total score 30 30        Immunization History  Administered Date(s) Administered  .  Influenza Split 01/24/2014  . Influenza, High Dose Seasonal PF 01/27/2018  . Influenza,inj,Quad PF,6+ Mos 12/31/2015  . Influenza-Unspecified 01/24/2013, 01/02/2014,  02/18/2015, 12/26/2016  . Pneumococcal Conjugate-13 12/31/2015  . Pneumococcal Polysaccharide-23 04/27/2012  . Rabies, IM 05/02/2018, 05/05/2018, 05/09/2018, 05/16/2018  . Td 05/25/1988  . Tdap 02/03/2014  . Zoster 04/28/2010  . Zoster Recombinat (Shingrix) 01/06/2018    Screening Tests Health Maintenance  Topic Date Due  . DEXA SCAN  01/03/2015  . PNA vac Low Risk Adult (2 of 2 - PPSV23) 04/27/2017  . INFLUENZA VACCINE  11/26/2018  . PAP SMEAR-Modifier  01/09/2019  . MAMMOGRAM  02/09/2019 (Originally 01/03/2000)  . Fecal DNA (Cologuard)  10/03/2020  . TETANUS/TDAP  02/04/2024  . Hepatitis C Screening  Completed      Plan:    Please schedule your next medicare wellness visit with me in 1 yr.  Continue to eat heart healthy diet (full of fruits, vegetables, whole grains, lean protein, water--limit salt, fat, and sugar intake) and increase physical activity as tolerated.  Continue doing brain stimulating activities (puzzles, reading, adult coloring books, staying active) to keep memory sharp.   Bring a copy of your living will and/or healthcare power of attorney to your next office visit.   I have personally reviewed and noted the following in the patient's chart:   . Medical and social history . Use of alcohol, tobacco or illicit drugs  . Current medications and supplements . Functional ability and status . Nutritional status . Physical activity . Advanced directives . List of other physicians . Hospitalizations, surgeries, and ER visits in previous 12 months . Vitals . Screenings to include cognitive, depression, and falls . Referrals and appointments  In addition, I have reviewed and discussed with patient certain preventive protocols, quality metrics, and best practice recommendations. A written personalized care plan for preventive services as well as general preventive health recommendations were provided to patient.     Shela Nevin, [place]  01/18/2019  PCP  Note: Pt states she lost sibling a few months ago and lost her neighbor yesterday. Reports she is not depressed, but just very angry. Declines needing assistance. Pt also is uncertain about bone density scan and would like to discuss at PCP appt. 01/24/19.

## 2019-01-18 ENCOUNTER — Ambulatory Visit (INDEPENDENT_AMBULATORY_CARE_PROVIDER_SITE_OTHER): Payer: Medicare HMO | Admitting: *Deleted

## 2019-01-18 ENCOUNTER — Encounter: Payer: Self-pay | Admitting: *Deleted

## 2019-01-18 DIAGNOSIS — Z0000 Encounter for general adult medical examination without abnormal findings: Secondary | ICD-10-CM

## 2019-01-18 NOTE — Patient Instructions (Signed)
Please schedule your next medicare wellness visit with me in 1 yr.  Continue to eat heart healthy diet (full of fruits, vegetables, whole grains, lean protein, water--limit salt, fat, and sugar intake) and increase physical activity as tolerated.  Continue doing brain stimulating activities (puzzles, reading, adult coloring books, staying active) to keep memory sharp.   Bring a copy of your living will and/or healthcare power of attorney to your next office visit.   [name] , Thank you for taking time to come for your Medicare Wellness Visit. I appreciate your ongoing commitment to your health goals. Please review the following plan we discussed and let me know if I can assist you in the future.   These are the goals we discussed: Goals    . Quit smoking / using tobacco     Starting 02/26/16, I will attempt to quit smoking.       This is a list of the screening recommended for you and due dates:  Health Maintenance  Topic Date Due  . DEXA scan (bone density measurement)  01/03/2015  . Pneumonia vaccines (2 of 2 - PPSV23) 04/27/2017  . Flu Shot  11/26/2018  . Pap Smear  01/09/2019  . Mammogram  02/09/2019*  . Cologuard (Stool DNA test)  10/03/2020  . Tetanus Vaccine  02/04/2024  .  Hepatitis C: One time screening is recommended by Center for Disease Control  (CDC) for  adults born from [birth year] through 1966.   Completed  *Topic was postponed. The date shown is not the original due date.    Health Maintenance After Age 63 After age 36, you are at a higher risk for certain long-term diseases and infections as well as injuries from falls. Falls are a major cause of broken bones and head injuries in people who are older than age 72. Getting regular preventive care can help to keep you healthy and well. Preventive care includes getting regular testing and making lifestyle changes as recommended by your health care provider. Talk with your health care provider about:  Which screenings and  tests you should have. A screening is a test that checks for a disease when you have no symptoms.  A diet and exercise plan that is right for you. What should I know about screenings and tests to prevent falls? Screening and testing are the best ways to find a health problem early. Early diagnosis and treatment give you the best chance of managing medical conditions that are common after age 60. Certain conditions and lifestyle choices may make you more likely to have a fall. Your health care provider may recommend:  Regular vision checks. Poor vision and conditions such as cataracts can make you more likely to have a fall. If you wear glasses, make sure to get your prescription updated if your vision changes.  Medicine review. Work with your health care provider to regularly review all of the medicines you are taking, including over-the-counter medicines. Ask your health care provider about any side effects that may make you more likely to have a fall. Tell your health care provider if any medicines that you take make you feel dizzy or sleepy.  Osteoporosis screening. Osteoporosis is a condition that causes the bones to get weaker. This can make the bones weak and cause them to break more easily.  Blood pressure screening. Blood pressure changes and medicines to control blood pressure can make you feel dizzy.  Strength and balance checks. Your health care provider may recommend certain tests  to check your strength and balance while standing, walking, or changing positions.  Foot health exam. Foot pain and numbness, as well as not wearing proper footwear, can make you more likely to have a fall.  Depression screening. You may be more likely to have a fall if you have a fear of falling, feel emotionally low, or feel unable to do activities that you used to do.  Alcohol use screening. Using too much alcohol can affect your balance and may make you more likely to have a fall. What actions can I  take to lower my risk of falls? General instructions  Talk with your health care provider about your risks for falling. Tell your health care provider if: ? You fall. Be sure to tell your health care provider about all falls, even ones that seem minor. ? You feel dizzy, sleepy, or off-balance.  Take over-the-counter and prescription medicines only as told by your health care provider. These include any supplements.  Eat a healthy diet and maintain a healthy weight. A healthy diet includes low-fat dairy products, low-fat (lean) meats, and fiber from whole grains, beans, and lots of fruits and vegetables. Home safety  Remove any tripping hazards, such as rugs, cords, and clutter.  Install safety equipment such as grab bars in bathrooms and safety rails on stairs.  Keep rooms and walkways well-lit. Activity   Follow a regular exercise program to stay fit. This will help you maintain your balance. Ask your health care provider what types of exercise are appropriate for you.  If you need a cane or walker, use it as recommended by your health care provider.  Wear supportive shoes that have nonskid soles. Lifestyle  Do not drink alcohol if your health care provider tells you not to drink.  If you drink alcohol, limit how much you have: ? 0-1 drink a day for women. ? 0-2 drinks a day for men.  Be aware of how much alcohol is in your drink. In the [place] one drink equals one typical bottle of beer (12 oz), one-half glass of wine (5 oz), or one shot of hard liquor (1 oz).  Do not use any products that contain nicotine or tobacco, such as cigarettes and e-cigarettes. If you need help quitting, ask your health care provider. Summary  Having a healthy lifestyle and getting preventive care can help to protect your health and wellness after age 77.  Screening and testing are the best way to find a health problem early and help you avoid having a fall. Early diagnosis and treatment give you  the best chance for managing medical conditions that are more common for people who are older than age 17.  Falls are a major cause of broken bones and head injuries in people who are older than age 28. Take precautions to prevent a fall at home.  Work with your health care provider to learn what changes you can make to improve your health and wellness and to prevent falls. This information is not intended to replace advice given to you by your health care provider. Make sure you discuss any questions you have with your health care provider. Document Released: 02/24/2017 Document Revised: 08/04/2018 Document Reviewed: 02/24/2017 Elsevier Patient Education  2020 [employer].

## 2019-01-24 ENCOUNTER — Ambulatory Visit (INDEPENDENT_AMBULATORY_CARE_PROVIDER_SITE_OTHER): Payer: Medicare HMO | Admitting: Family Medicine

## 2019-01-24 ENCOUNTER — Other Ambulatory Visit: Payer: Self-pay

## 2019-01-24 ENCOUNTER — Encounter: Payer: Self-pay | Admitting: Family Medicine

## 2019-01-24 VITALS — BP 122/60 | HR 74 | Temp 98.1°F | Ht 66.0 in | Wt 179.0 lb

## 2019-01-24 DIAGNOSIS — Z122 Encounter for screening for malignant neoplasm of respiratory organs: Secondary | ICD-10-CM | POA: Diagnosis not present

## 2019-01-24 DIAGNOSIS — F17210 Nicotine dependence, cigarettes, uncomplicated: Secondary | ICD-10-CM | POA: Diagnosis not present

## 2019-01-24 DIAGNOSIS — Z23 Encounter for immunization: Secondary | ICD-10-CM | POA: Diagnosis not present

## 2019-01-24 DIAGNOSIS — R053 Chronic cough: Secondary | ICD-10-CM

## 2019-01-24 DIAGNOSIS — R69 Illness, unspecified: Secondary | ICD-10-CM | POA: Diagnosis not present

## 2019-01-24 DIAGNOSIS — E039 Hypothyroidism, unspecified: Secondary | ICD-10-CM | POA: Diagnosis not present

## 2019-01-24 DIAGNOSIS — R05 Cough: Secondary | ICD-10-CM | POA: Diagnosis not present

## 2019-01-24 NOTE — Assessment & Plan Note (Signed)
-  She reports that she has f/u with Dr. Chalmers Cater next month.

## 2019-01-24 NOTE — Progress Notes (Signed)
[name] - 68 y.o. female MRN [id number]  Date of birth: 05/04/1950  Subjective Chief Complaint  Patient presents with  . Follow-up  . Cough    clear mucus     HPI [name] is a 68 y.o. female here today for follow up of chronic cough.  She was seen in 05/2018 for bronchitis.  Treated with doxycycline at that time, however declined steroid and albuterol.  She reports she really hasn't had a whole lot of improvement since that time.  She continues to have a daily cough with clear phlegm.  She does reports feeling tired and fatigued.  She has some occasional shortness of breath and chest tightness.  She is unsure if she has had wheezing but has a "rattle" in her chest. She denies reflux symptoms or post nasal drip  She continues to smoke ~1ppd.  Has failed patches, unsure if she would like to try chantix again.  ROS:  A comprehensive ROS was completed and negative except as noted per HPI    Allergies  Allergen Reactions  . Cortizone-10 [Hydrocortisone] Hives  . Shellfish Allergy Other (See Comments) and Anaphylaxis    Death  . Synthroid [Levothyroxine Sodium] Anaphylaxis    Internal shaking   . Azithromycin Hives  . Betadine [Povidone Iodine]     Aspiration   . Calcium Channel Blockers     Asthma  . Codeine     Flu-like symptoms   . Duloxetine Itching and Nausea Only    Dry mouth  . Fluoride Preparations Other (See Comments)    Blisters  . Iodine Other (See Comments)    Aspiration   . Levaquin [Levofloxacin In D5w]   . Lyrica [Pregabalin] Other (See Comments)    Make her very mean and angery  . Novocain [Procaine] Other (See Comments)    Heart Palpations  . Other     Beans,carbonation, dust and mold,floride,fried foods, household cleaner, melon,mushrooms,peppers,preservatives,rasisns,spicey,spinach,trees,milk products,shellfish  . Peanuts [Peanut Oil] Swelling    **Allergy to ALL NUTS** Face Swelling   . Penicillins   . Pollen Extract Other (See Comments)   Hemorrhage   . Prozac [Fluoxetine Hcl]   . Wellbutrin [Bupropion] Hives    After 1 pill fist size bumps appeared  . Lanolin Rash  . Molds & Smuts Other (See Comments)  . Mushroom Extract Complex Nausea And Vomiting    Past Medical History:  Diagnosis Date  . Abdominal pain, epigastric   . Abdominal pain, epigastric   . Abdominal pain, right lower quadrant   . Abnormal weight gain   . Abnormality of gait   . Anxiety state, unspecified   . Asymptomatic varicose veins   . Blood vessel replaced by other means   . Cramp of limb   . Disorder of bone and cartilage, unspecified   . Dizziness and giddiness   . Edema   . Encounter for long-term (current) use of other medications   . Herpes zoster without mention of complication   . Herpes zoster without mention of complication   . Hypersomnia, unspecified   . Insomnia, unspecified   . Long term (current) use of anticoagulants   . Lumbago   . Lumbago   . Muscle weakness (generalized)   . Myalgia and myositis, unspecified   . Obesity, unspecified   . Optic neuritis   . Other abnormal blood chemistry   . Other and unspecified hyperlipidemia   . Other cataract   . Pain in joint, site unspecified   .  Pain in limb   . Pain in limb   . Palpitations   . Phlebitis and thrombophlebitis of lower extremities, unspecified   . Phlebitis and thrombophlebitis of other deep vessels of lower extremities   . Rash and other nonspecific skin eruption   . Spasm of muscle   . Syncope and collapse   . Tobacco use disorder   . Unspecified essential hypertension   . Unspecified hypothyroidism   . Unspecified sinusitis (chronic)   . Unspecified tinnitus     Past Surgical History:  Procedure Laterality Date  . CATARACT EXTRACTION, BILATERAL    . EYE SURGERY     zag laser procedure.  Marland Kitchen [place] IMPLANT  2008   DR DICKERSON  . HIP SURGERY LEFT  03/2010   DR [facility]  . HIP SURGERY RIGHT  06/2010   DR [facility]  . LEFT  SHOULDER  2012   DR [facility]   . LOWER BACK  05/26/2007   DR JEFF BEANE  . RIGHT BREAST BIOPSY  1986  . RIGHT KNEE  1971    Social History   Socioeconomic History  . Marital status: Widowed    Spouse name: [name]  . Number of children: 0  . Years of education: college  . Highest education level: Not on file  Occupational History    Employer: [employer]    Comment: answers phones  Social Needs  . Financial resource strain: Not hard at all  . Food insecurity    Worry: Never true    Inability: Never true  . Transportation needs    Medical: No    Non-medical: No  Tobacco Use  . Smoking status: Heavy Tobacco Smoker    Packs/day: 1.00    Types: Cigarettes  . Smokeless tobacco: Never Used  . Tobacco comment: one pack Daily Maybe more.  Substance and Sexual Activity  . Alcohol use: Yes  . Drug use: No  . Sexual activity: Not Currently    Partners: Male    Comment: husband  Lifestyle  . Physical activity    Days per week: 1 day    Minutes per session: 30 min  . Stress: Very much  Relationships  . Social connections    Talks on phone: More than three times a week    Gets together: Once a week    Attends religious service: Never    Active member of club or organization: No    Attends meetings of clubs or organizations: Never    Relationship status: Widowed  Other Topics Concern  . Not on file  Social History Narrative   Patient  Lives at home wit her husband [name]) . Patient still works.college education.   Right handed.   Caffiene -None    Family History  Problem Relation Age of Onset  . Stroke Mother   . Stroke Father   . Heart disease Father   . Stroke Brother     Health Maintenance  Topic Date Due  . DEXA SCAN  01/03/2015  . PNA vac Low Risk Adult (2 of 2 - PPSV23) 04/27/2017  . INFLUENZA VACCINE  11/26/2018  . PAP SMEAR-Modifier  01/09/2019  . MAMMOGRAM  02/09/2019 (Originally 01/03/2000)  . Fecal DNA (Cologuard)  10/03/2020  .  TETANUS/TDAP  02/04/2024  . Hepatitis C Screening  Completed    ----------------------------------------------------------------------------------------------------------------------------------------------------------------------------------------------------------------- Physical Exam BP 122/60 (BP Location: Left Arm)   Pulse 74   Temp 98.1 F (36.7 C) (Oral)   Ht 5\' 6"  (1.676  m)   Wt 179 lb (81.2 kg)   SpO2 95%   BMI 28.89 kg/m   Physical Exam Constitutional:      Appearance: Normal appearance.  HENT:     Head: Normocephalic and atraumatic.     Mouth/Throat:     Mouth: Mucous membranes are moist.  Eyes:     General: No scleral icterus. Neck:     Musculoskeletal: Neck supple.  Cardiovascular:     Rate and Rhythm: Normal rate and regular rhythm.  Pulmonary:     Effort: Pulmonary effort is normal.     Breath sounds: Normal breath sounds.  Skin:    General: Skin is warm and dry.     Findings: No rash.  Neurological:     General: No focal deficit present.     Mental Status: She is alert.  Psychiatric:        Mood and Affect: Mood normal.        Behavior: Behavior normal.     ------------------------------------------------------------------------------------------------------------------------------------------------------------------------------------------------------------------- Assessment and Plan  Chronic cough -Discussed with her that she likely has chronic bronchitis/COPD.  Recommended trial of ICS/LABA inhaler however she declines this, reporting that she had a friend who died from using advair inhaler.   -Discussed urgent need to quit smoking -Flu vaccine recommended, she will get this today.  Declines pneumovax -Referral placed to pulmonology as well for further evaluation of chronic cough and consideration of lung cancer screening given long history of heavy tobacco use.    Hypothyroidism -She reports that she has f/u with Dr. Chalmers Cater next month.   >25  minutes spent with patient with at least 50% of time spent providing counseling and/or coordination of care.

## 2019-01-24 NOTE — Assessment & Plan Note (Addendum)
-  Discussed with her that she likely has chronic bronchitis/COPD.  Recommended trial of ICS/LABA inhaler however she declines this, reporting that she had a friend who died from using advair inhaler.   -Discussed urgent need to quit smoking -Flu vaccine recommended, she will get this today.  Declines pneumovax -Referral placed to pulmonology as well for further evaluation of chronic cough and consideration of lung cancer screening given long history of heavy tobacco use.

## 2019-01-31 ENCOUNTER — Encounter: Payer: Self-pay | Admitting: Family Medicine

## 2019-04-19 ENCOUNTER — Telehealth: Payer: Self-pay | Admitting: Family Medicine

## 2019-04-19 NOTE — Telephone Encounter (Signed)
Medication Refill - Medication: Pt needs a new EpiPen, please advise if appt is needed   Has the patient contacted their pharmacy? Yes.   (Agent: If no, request that the patient contact the pharmacy for the refill.) (Agent: If yes, when and what did the pharmacy advise?)  Preferred Pharmacy (with phone number or street name):  [id number] [address], [place] [address]  [place] [address]  Phone: [phone] Fax: [phone]     Agent: Please be advised that RX refills may take up to 3 business days. We ask that you follow-up with your pharmacy.

## 2019-04-20 ENCOUNTER — Other Ambulatory Visit: Payer: Self-pay | Admitting: Family Medicine

## 2019-04-20 MED ORDER — EPINEPHRINE 0.3 MG/0.3ML IJ SOAJ: 0.3000 mg | INTRAMUSCULAR | 1 refills | Status: DC | PRN

## 2019-04-20 NOTE — Telephone Encounter (Signed)
Rx sent in

## 2019-05-16 DIAGNOSIS — Z01419 Encounter for gynecological examination (general) (routine) without abnormal findings: Secondary | ICD-10-CM | POA: Diagnosis not present

## 2019-05-19 ENCOUNTER — Telehealth: Payer: Self-pay | Admitting: Acute Care

## 2019-05-22 NOTE — Telephone Encounter (Signed)
LMTC x 1  

## 2019-05-25 NOTE — Telephone Encounter (Signed)
LMTC x 1 - Will close this message and refer to referral notes 

## 2019-06-08 DIAGNOSIS — E039 Hypothyroidism, unspecified: Secondary | ICD-10-CM | POA: Diagnosis not present

## 2019-07-21 ENCOUNTER — Ambulatory Visit: Payer: Medicare HMO | Admitting: Podiatry

## 2019-07-24 DIAGNOSIS — Z20828 Contact with and (suspected) exposure to other viral communicable diseases: Secondary | ICD-10-CM | POA: Diagnosis not present

## 2019-08-28 NOTE — Patient Instructions (Addendum)
Health Maintenance Due  Topic Date Due  . COVID-19 Vaccine (1) Never done  . MAMMOGRAM  01/03/2000  . DEXA SCAN  Never done  . PAP SMEAR-Modifier  01/09/2019    Depression screen [facility] 2/9 01/18/2019 12/31/2017 12/31/2017  Decreased Interest 0 3 0  Down, Depressed, Hopeless 1 0 0  PHQ - 2 Score 1 3 0  Altered sleeping - 1 -  Tired, decreased energy - 3 -  Change in appetite - 3 -  Feeling bad or failure about yourself  - 0 -  Trouble concentrating - 3 -  Moving slowly or fidgety/restless - 3 -  Suicidal thoughts - 0 -  PHQ-9 Score - 16 -   Check into shingrix vaccine - did you have 2 shots?

## 2019-08-30 ENCOUNTER — Other Ambulatory Visit: Payer: Self-pay

## 2019-08-30 ENCOUNTER — Encounter: Payer: Self-pay | Admitting: Family Medicine

## 2019-08-30 ENCOUNTER — Ambulatory Visit (INDEPENDENT_AMBULATORY_CARE_PROVIDER_SITE_OTHER): Payer: Medicare HMO | Admitting: Family Medicine

## 2019-08-30 ENCOUNTER — Ambulatory Visit (INDEPENDENT_AMBULATORY_CARE_PROVIDER_SITE_OTHER): Payer: Medicare HMO

## 2019-08-30 VITALS — BP 140/80 | HR 66 | Temp 98.3°F | Ht 66.0 in | Wt 179.2 lb

## 2019-08-30 DIAGNOSIS — Z5320 Procedure and treatment not carried out because of patient's decision for unspecified reasons: Secondary | ICD-10-CM

## 2019-08-30 DIAGNOSIS — E039 Hypothyroidism, unspecified: Secondary | ICD-10-CM

## 2019-08-30 DIAGNOSIS — Z720 Tobacco use: Secondary | ICD-10-CM

## 2019-08-30 DIAGNOSIS — M79671 Pain in right foot: Secondary | ICD-10-CM

## 2019-08-30 DIAGNOSIS — R05 Cough: Secondary | ICD-10-CM

## 2019-08-30 DIAGNOSIS — Z13220 Encounter for screening for lipoid disorders: Secondary | ICD-10-CM

## 2019-08-30 DIAGNOSIS — Z0000 Encounter for general adult medical examination without abnormal findings: Secondary | ICD-10-CM

## 2019-08-30 DIAGNOSIS — Z13820 Encounter for screening for osteoporosis: Secondary | ICD-10-CM

## 2019-08-30 DIAGNOSIS — R053 Chronic cough: Secondary | ICD-10-CM

## 2019-08-30 LAB — BASIC METABOLIC PANEL
BUN: 12 mg/dL (ref 6–23)
CO2: 28 mEq/L (ref 19–32)
Calcium: 9.2 mg/dL (ref 8.4–10.5)
Chloride: 106 mEq/L (ref 96–112)
Creatinine, Ser: 0.76 mg/dL (ref 0.40–1.20)
GFR: 75.31 mL/min (ref >60.00)
Glucose, Bld: 95 mg/dL (ref 70–99)
Potassium: 4.5 mEq/L (ref 3.5–5.1)
Sodium: 140 mEq/L (ref 135–145)

## 2019-08-30 LAB — LIPID PANEL
Cholesterol: 207 mg/dL — ABNORMAL HIGH (ref 0–200)
HDL: 63.7 mg/dL (ref >39.00)
LDL Cholesterol: 126 mg/dL — ABNORMAL HIGH (ref 0–99)
NonHDL: 143.57
Total CHOL/HDL Ratio: 3
Triglycerides: 87 mg/dL (ref 0.0–149.0)
VLDL: 17.4 mg/dL (ref 0.0–40.0)

## 2019-08-30 LAB — CBC
HCT: 43.7 % (ref 36.0–46.0)
Hemoglobin: 14.9 g/dL (ref 12.0–15.0)
MCHC: 34.1 g/dL (ref 30.0–36.0)
MCV: 85.5 fl (ref 78.0–100.0)
Platelets: 243 10*3/uL (ref 150.0–400.0)
RBC: 5.1 Mil/uL (ref 3.87–5.11)
RDW: 13 % (ref 11.5–15.5)
WBC: 8.4 10*3/uL (ref 4.0–10.5)

## 2019-08-30 LAB — ALT: ALT: 9 U/L (ref 0–35)

## 2019-08-30 LAB — VITAMIN D 25 HYDROXY (VIT D DEFICIENCY, FRACTURES): VITD: 23.16 ng/mL — ABNORMAL LOW (ref 30.00–100.00)

## 2019-08-30 LAB — AST: AST: 12 U/L (ref 0–37)

## 2019-08-30 NOTE — Progress Notes (Signed)
[name] is a 69 y.o. female  Chief Complaint  Patient presents with  . Establish Care    Pt here for physical and TOC.  Pt c/o rt foot pain, x 45months.  Pt also would like Dr. Loletha Grayer to look at a place on her back on the rt side.    HPI: [name] is a 69 y.o. female here for a TOC appt, previous PCP Dr. Zigmund Daniel, and CPE and concerns about Rt foot pain. She is agreeable to labs today.  She needs a new referral to pulm for chronic cough since 04/2018 and h/o tobacco use. She has never had low dose CT chest.  Last PAP: 04/2019 - Dyanna Clark Last mammo: declines Last Dexa: due Last colonoscopy: Cologuard 09/2017 - negative  Med refills needed today? none  Specialists: endo Dr. Chalmers Cater - last seen yesterday and pt was told she can f/u PRN and routine f/u with PCP; ophthalmology ([facility]), vascular surgery ([facility])  Pt complains of Rt foot pain x 1 mo. Intermittent. Worse when she stands and walks, but it is also present at rest. Pain "moves" to different locations of foot. Pain was on bottom of foot but today on medial foot. No redness or warmth. Pt states she had swelling but resolved today.  No injury or trauma.  She has used [employer] on area but not effective.   Past Medical History:  Diagnosis Date  . Abdominal pain, epigastric   . Abdominal pain, epigastric   . Abdominal pain, right lower quadrant   . Abnormal weight gain   . Abnormality of gait   . Anxiety state, unspecified   . Asymptomatic varicose veins   . Blood vessel replaced by other means   . Cramp of limb   . Disorder of bone and cartilage, unspecified   . Dizziness and giddiness   . Edema   . Encounter for long-term (current) use of other medications   . Herpes zoster without mention of complication   . Herpes zoster without mention of complication   . Hypersomnia, unspecified   . Insomnia, unspecified   . Long term (current) use of anticoagulants   . Lumbago   . Lumbago   . Muscle weakness (generalized)    . Myalgia and myositis, unspecified   . Obesity, unspecified   . Optic neuritis   . Other abnormal blood chemistry   . Other and unspecified hyperlipidemia   . Other cataract   . Pain in joint, site unspecified   . Pain in limb   . Pain in limb   . Palpitations   . Phlebitis and thrombophlebitis of lower extremities, unspecified   . Phlebitis and thrombophlebitis of other deep vessels of lower extremities   . Rash and other nonspecific skin eruption   . Spasm of muscle   . Syncope and collapse   . Tobacco use disorder   . Unspecified essential hypertension   . Unspecified hypothyroidism   . Unspecified sinusitis (chronic)   . Unspecified tinnitus     Past Surgical History:  Procedure Laterality Date  . CATARACT EXTRACTION, BILATERAL    . EYE SURGERY     zag laser procedure.  Marland Kitchen [place] IMPLANT  2008   DR DICKERSON  . HIP SURGERY LEFT  03/2010   DR [facility]  . HIP SURGERY RIGHT  06/2010   DR [facility]  . LEFT SHOULDER  2012   DR [facility]   . LOWER BACK  05/26/2007  DR JEFF BEANE  . RIGHT BREAST BIOPSY  1986  . RIGHT KNEE  1971    Social History   Socioeconomic History  . Marital status: Widowed    Spouse name: [name]  . Number of children: 0  . Years of education: college  . Highest education level: Not on file  Occupational History    Employer: [employer]    Comment: answers phones  Tobacco Use  . Smoking status: Heavy Tobacco Smoker    Packs/day: 1.00    Types: Cigarettes  . Smokeless tobacco: Never Used  . Tobacco comment: one pack Daily Maybe more.  Substance and Sexual Activity  . Alcohol use: Yes  . Drug use: No  . Sexual activity: Not Currently    Partners: Male    Comment: husband  Other Topics Concern  . Not on file  Social History Narrative   Patient  Lives at home wit her husband [name]) . Patient still works.college education.   Right handed.   Caffiene -None   Social Determinants of Health    Financial Resource Strain:   . Difficulty of Paying Living Expenses:   Food Insecurity:   . Worried About Charity fundraiser in the Last Year:   . Arboriculturist in the Last Year:   Transportation Needs:   . Film/video editor (Medical):   Marland Kitchen Lack of Transportation (Non-Medical):   Physical Activity:   . Days of Exercise per Week:   . Minutes of Exercise per Session:   Stress:   . Feeling of Stress :   Social Connections:   . Frequency of Communication with Friends and Family:   . Frequency of Social Gatherings with Friends and Family:   . Attends Religious Services:   . Active Member of Clubs or Organizations:   . Attends Archivist Meetings:   Marland Kitchen Marital Status:   Intimate Partner Violence:   . Fear of Current or Ex-Partner:   . Emotionally Abused:   Marland Kitchen Physically Abused:   . Sexually Abused:     Family History  Problem Relation Age of Onset  . Stroke Mother   . Stroke Father   . Heart disease Father   . Stroke Brother      Immunization History  Administered Date(s) Administered  . Fluad Quad(high Dose 65+) 01/24/2019  . Influenza Split 01/24/2014  . Influenza, High Dose Seasonal PF 01/27/2018  . Influenza,inj,Quad PF,6+ Mos 12/31/2015  . Influenza-Unspecified 01/24/2013, 01/02/2014, 02/18/2015, 12/26/2016  . Pneumococcal Conjugate-13 12/31/2015  . Pneumococcal Polysaccharide-23 04/27/2012  . Rabies, IM 05/02/2018, 05/05/2018, 05/09/2018, 05/16/2018  . Td 05/25/1988  . Tdap 02/03/2014  . Zoster 04/28/2010  . Zoster Recombinat (Shingrix) 01/06/2018  she thinks she [facility]  Outpatient Encounter Medications as of 08/30/2019  Medication Sig  . aspirin 325 MG tablet Take 325 mg by mouth daily.   . Biotin 5000 MCG TABS Take by mouth daily.  . Cholecalciferol (VITAMIN D-3 PO) Take 1 tablet by mouth daily.  Marland Kitchen Dextran 70-Hypromellose (ARTIFICIAL TEARS) 0.1-0.3 % SOLN Artificial Tears  . EPINEPHrine 0.3 mg/0.3 mL IJ SOAJ  injection Inject 0.3 mLs (0.3 mg total) into the muscle as needed for anaphylaxis.  Vladimir Faster Glycol-Propyl Glycol (SYSTANE PRESERVATIVE FREE OP) Apply to eye.  . prednisoLONE acetate (PRED FORTE) 1 % ophthalmic suspension prednisolone acetate 1 % eye drops,suspension  INSTILL 1 DROP INTO LEFT EYE 3 TIMES A DAY FOR 7 DAYS  . TIROSINT 75  MCG CAPS Take 1 capsule by mouth daily.  Marland Kitchen triamcinolone ointment (KENALOG) 0.1 % triamcinolone acetonide 0.1 % topical ointment  APPLY A THIN LAYER TO THE AFFECTED vulvar AREA(S) BY TOPICAL ROUTE daily  . Vitamins/Minerals TABS Take by mouth.   No facility-administered encounter medications on file as of 08/30/2019.     ROS: Pertinent positives and negatives noted in HPI. Remainder of ROS non-contributory    Allergies  Allergen Reactions  . Cortizone-10 [Hydrocortisone] Hives  . Shellfish Allergy Other (See Comments) and Anaphylaxis    Death  . Synthroid [Levothyroxine Sodium] Anaphylaxis    Internal shaking   . Azithromycin Hives  . Betadine [Povidone Iodine]     Aspiration   . Calcium Channel Blockers     Asthma  . Codeine     Flu-like symptoms   . Duloxetine Itching and Nausea Only    Dry mouth  . Fluoride Preparations Other (See Comments)    Blisters  . Iodine Other (See Comments)    Aspiration   . Levaquin [Levofloxacin In D5w]   . Lyrica [Pregabalin] Other (See Comments)    Make her very mean and angery  . Novocain [Procaine] Other (See Comments)    Heart Palpations  . Other     Beans,carbonation, dust and mold,floride,fried foods, household cleaner, melon,mushrooms,peppers,preservatives,rasisns,spicey,spinach,trees,milk products,shellfish  . Peanuts [Peanut Oil] Swelling    **Allergy to ALL NUTS** Face Swelling   . Penicillins   . Pollen Extract Other (See Comments)    Hemorrhage   . Prozac [Fluoxetine Hcl]   . Wellbutrin [Bupropion] Hives    After 1 pill fist size bumps appeared  . Lanolin Rash  . Molds & Smuts Other (See  Comments)  . Mushroom Extract Complex Nausea And Vomiting    BP 140/80 (BP Location: Left Arm, Patient Position: Sitting, Cuff Size: Normal)   Pulse 66   Temp 98.3 F (36.8 C) (Temporal)   Ht 5\' 6"  (1.676 m)   Wt 179 lb 3.2 oz (81.3 kg)   SpO2 95%   BMI 28.92 kg/m   BP Readings from Last 3 Encounters:  08/30/19 140/80  01/24/19 122/60  06/21/18 132/70   Pulse Readings from Last 3 Encounters:  08/30/19 66  01/24/19 74  06/21/18 88   Wt Readings from Last 3 Encounters:  08/30/19 179 lb 3.2 oz (81.3 kg)  01/24/19 179 lb (81.2 kg)  06/21/18 174 lb 12.8 oz (79.3 kg)     Physical Exam  Constitutional: She is oriented to person, place, and time. She appears well-developed and well-nourished. No distress.  HENT:  Head: Normocephalic and atraumatic.  Right Ear: Tympanic membrane and ear canal normal.  Left Ear: Tympanic membrane and ear canal normal.  Nose: Nose normal.  Mouth/Throat: Oropharynx is clear and moist and mucous membranes are normal.  Eyes: Pupils are equal, round, and reactive to light. Conjunctivae are normal.  Neck: No thyromegaly present.  Cardiovascular: Normal rate, regular rhythm, normal heart sounds and intact distal pulses.  No murmur heard. Pulmonary/Chest: Effort normal and breath sounds normal. No respiratory distress. She has no wheezes. She has no rhonchi.  Abdominal: Soft. Bowel sounds are normal. She exhibits no distension and no mass. There is no abdominal tenderness.  Musculoskeletal:        General: No edema.     Cervical back: Neck supple.  Lymphadenopathy:    She has no cervical adenopathy.  Neurological: She is alert and oriented to person, place, and time. She exhibits normal muscle tone. Coordination  normal.  Skin: Skin is warm and dry.  Psychiatric: She has a normal mood and affect. Her behavior is normal.     A/P:  1. Encounter for general medical examination - due for dexa, PAP and CRC screening UTD, declines mammo - ? Due for  shingrix #2 but pt is unsure. [facility] - discussed importance of regular CV exercise, healthy diet, adequate sleep - ALT - AST - Basic metabolic panel - VITAMIN D 25 Hydroxy (Vit-D Deficiency, Fractures) - Lipid panel - CBC  2. Hypothyroidism, unspecified type - TFTs done with endocrinology in the past 4-42mo, as per pt - she does not need refill of med  3. Screening for osteoporosis - DG Bone Density; Future  4. Mammogram declined  5. Right foot pain - Ambulatory referral to Podiatry - DG Foot Complete Right  6. Screening for hypercholesterolemia - Lipid panel  7. Chronic cough 8. Tobacco abuse - Ambulatory referral to Pulmonology - pt has never had low dose CT for lung cancer screening and states she will discuss w/ pulmonary  This visit occurred during the SARS-CoV-2 public health emergency.  Safety protocols were in place, including screening questions prior to the visit, additional usage of staff PPE, and extensive cleaning of exam room while observing appropriate contact time as indicated for disinfecting solutions.

## 2019-08-31 DIAGNOSIS — I83893 Varicose veins of bilateral lower extremities with other complications: Secondary | ICD-10-CM | POA: Insufficient documentation

## 2019-09-08 ENCOUNTER — Encounter: Payer: Self-pay | Admitting: Family Medicine

## 2019-09-08 ENCOUNTER — Other Ambulatory Visit: Payer: Self-pay | Admitting: Family Medicine

## 2019-09-08 ENCOUNTER — Telehealth: Payer: Self-pay | Admitting: Family Medicine

## 2019-09-08 DIAGNOSIS — E559 Vitamin D deficiency, unspecified: Secondary | ICD-10-CM

## 2019-09-08 MED ORDER — VITAMIN D (ERGOCALCIFEROL) 1.25 MG (50000 UNIT) PO CAPS: 50000.0000 [IU] | ORAL_CAPSULE | ORAL | 2 refills | Status: DC

## 2019-09-08 NOTE — Telephone Encounter (Signed)
Pt informed of Dr. Vivia Ewing message.  Pt wanted to know if she should stop the OTC Vit D3.  Please advise.

## 2019-09-08 NOTE — Telephone Encounter (Signed)
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 I'd recommend trying to take the Vit D and she can stop if she notes any side effects

## 2019-09-08 NOTE — Telephone Encounter (Signed)
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 Do you still want her to take the vitamin D2?

## 2019-09-08 NOTE — Telephone Encounter (Signed)
Pt informed

## 2019-09-08 NOTE — Telephone Encounter (Signed)
Yes stop OTC Vit D while she is taking the weekly capsule

## 2019-09-16 ENCOUNTER — Encounter: Payer: Self-pay | Admitting: Family Medicine

## 2019-09-18 ENCOUNTER — Other Ambulatory Visit: Payer: Self-pay | Admitting: Podiatry

## 2019-09-18 ENCOUNTER — Encounter: Payer: Self-pay | Admitting: Podiatry

## 2019-09-18 ENCOUNTER — Ambulatory Visit (INDEPENDENT_AMBULATORY_CARE_PROVIDER_SITE_OTHER): Payer: Medicare HMO

## 2019-09-18 ENCOUNTER — Other Ambulatory Visit: Payer: Self-pay

## 2019-09-18 ENCOUNTER — Ambulatory Visit: Payer: Medicare HMO | Admitting: Podiatry

## 2019-09-18 VITALS — Temp 98.0°F

## 2019-09-18 DIAGNOSIS — M722 Plantar fascial fibromatosis: Secondary | ICD-10-CM | POA: Diagnosis not present

## 2019-09-18 DIAGNOSIS — R52 Pain, unspecified: Secondary | ICD-10-CM

## 2019-09-18 NOTE — Patient Instructions (Signed)

## 2019-09-20 NOTE — Progress Notes (Signed)
Subjective:   Patient ID: [name], female   DOB: 69 y.o.   MRN: [id number]   HPI Patient presents stating she has developed a lot of pain in the plantar aspect right heel and states is been present for several months.  Does not remember injury to the area and states is been making it more difficult for her to be comfortable.  Patient does not smoke likes to be active and is having reduced activity because of pain   Review of Systems  All other systems reviewed and are negative.       Objective:  Physical Exam Vitals and nursing note reviewed.  Constitutional:      Appearance: She is well-developed.  Pulmonary:     Effort: Pulmonary effort is normal.  Musculoskeletal:        General: Normal range of motion.  Skin:    General: Skin is warm.  Neurological:     Mental Status: She is alert.     Neurovascular status intact muscle strength found to be adequate range of motion within normal limits.  Patient is found to have exquisite discomfort plantar aspect right heel at the insertional point of the tendon into the calcaneus with inflammation fluid around the medial band.  Patient is found to have good digital perfusion well oriented x3 and did have an allergy when she was young to Novocain but has not had issues with any other anesthetics     Assessment:  Acute plantar fasciitis right with the patient having also a lowered arch     Plan:  H&P x-rays reviewed and today I did sterile prep and injected the fascia right 3 mg Kenalog 5 mg Xylocaine applied fascial brace to lift up the arch gave instructions for supportive shoes stretching exercises reappoint to recheck  X-rays indicate small spur no indications of stress fracture arthritis

## 2019-09-22 NOTE — Telephone Encounter (Signed)
Vit D can help our bones and teeth stay strong. Try the once a week pill. If that bothers you, Dr. Loletha Grayer can suggest alternative doses.

## 2019-10-04 ENCOUNTER — Ambulatory Visit: Payer: Medicare HMO | Admitting: Podiatry

## 2019-10-06 ENCOUNTER — Ambulatory Visit: Payer: Medicare HMO | Admitting: Podiatry

## 2019-10-06 ENCOUNTER — Other Ambulatory Visit: Payer: Self-pay | Admitting: Podiatry

## 2019-10-06 ENCOUNTER — Ambulatory Visit (INDEPENDENT_AMBULATORY_CARE_PROVIDER_SITE_OTHER): Payer: Medicare HMO

## 2019-10-06 ENCOUNTER — Other Ambulatory Visit: Payer: Self-pay

## 2019-10-06 ENCOUNTER — Encounter: Payer: Self-pay | Admitting: Podiatry

## 2019-10-06 VITALS — Temp 98.1°F

## 2019-10-06 DIAGNOSIS — H9319 Tinnitus, unspecified ear: Secondary | ICD-10-CM | POA: Insufficient documentation

## 2019-10-06 DIAGNOSIS — E669 Obesity, unspecified: Secondary | ICD-10-CM | POA: Insufficient documentation

## 2019-10-06 DIAGNOSIS — M25572 Pain in left ankle and joints of left foot: Secondary | ICD-10-CM

## 2019-10-06 DIAGNOSIS — M722 Plantar fascial fibromatosis: Secondary | ICD-10-CM

## 2019-10-06 DIAGNOSIS — R002 Palpitations: Secondary | ICD-10-CM | POA: Insufficient documentation

## 2019-10-06 DIAGNOSIS — E785 Hyperlipidemia, unspecified: Secondary | ICD-10-CM | POA: Insufficient documentation

## 2019-10-06 DIAGNOSIS — R55 Syncope and collapse: Secondary | ICD-10-CM | POA: Insufficient documentation

## 2019-10-06 DIAGNOSIS — M779 Enthesopathy, unspecified: Secondary | ICD-10-CM

## 2019-10-06 DIAGNOSIS — J329 Chronic sinusitis, unspecified: Secondary | ICD-10-CM | POA: Insufficient documentation

## 2019-10-06 DIAGNOSIS — B009 Herpesviral infection, unspecified: Secondary | ICD-10-CM | POA: Insufficient documentation

## 2019-10-06 DIAGNOSIS — G4710 Hypersomnia, unspecified: Secondary | ICD-10-CM | POA: Insufficient documentation

## 2019-10-06 DIAGNOSIS — I809 Phlebitis and thrombophlebitis of unspecified site: Secondary | ICD-10-CM | POA: Insufficient documentation

## 2019-10-08 NOTE — Progress Notes (Signed)
Subjective:   Patient ID: [name], female   DOB: 69 y.o.   MRN: [id number]   HPI Patient states she is doing pretty well with the right foot but she is concerned about the left that her bones felt like there again a snap and her weight leg went out on her 7 days ago she wants to make sure she did not do anything   ROS      Objective:  Physical Exam  Neurovascular status intact muscle strength found to be adequate right foot doing well with brace helping with mild discomfort and on the left I did note that there is some discomfort in the left lateral ankle but it is mild and I checked range of motion found to be adequate with mild swelling around the fibula     Assessment:  Probability for low-grade injury left but cannot rule out bony injury with improving fasciitis-like symptoms right     Plan:  H&P reviewed conditions and at this point for the left did precautionary x-ray.  Instructed on ice therapy and continued supportive therapy wearing of good shoes and also oral anti-inflammatories topical medication.  Reappoint as needed

## 2019-10-14 ENCOUNTER — Encounter: Payer: Self-pay | Admitting: Family Medicine

## 2019-10-16 NOTE — Telephone Encounter (Signed)
Please see message and advise.  Thank you. Last fill 01/27/18 Last OV 08/30/19

## 2019-10-18 MED ORDER — TIROSINT 75 MCG PO CAPS: 1.0000 | ORAL_CAPSULE | Freq: Every day | ORAL | 3 refills | Status: DC

## 2019-10-23 ENCOUNTER — Ambulatory Visit (INDEPENDENT_AMBULATORY_CARE_PROVIDER_SITE_OTHER): Payer: Medicare HMO | Admitting: Family

## 2019-10-23 ENCOUNTER — Ambulatory Visit (INDEPENDENT_AMBULATORY_CARE_PROVIDER_SITE_OTHER): Payer: Medicare HMO

## 2019-10-23 ENCOUNTER — Encounter: Payer: Self-pay | Admitting: Family

## 2019-10-23 ENCOUNTER — Other Ambulatory Visit: Payer: Self-pay

## 2019-10-23 VITALS — BP 140/80 | HR 75 | Temp 97.8°F | Ht 66.0 in | Wt 185.8 lb

## 2019-10-23 DIAGNOSIS — M25551 Pain in right hip: Secondary | ICD-10-CM | POA: Diagnosis not present

## 2019-10-23 NOTE — Patient Instructions (Signed)
Hip Pain The hip is the joint between the upper legs and the lower pelvis. The bones, cartilage, tendons, and muscles of your hip joint support your body and allow you to move around. Hip pain can range from a minor ache to severe pain in one or both of your hips. The pain may be felt on the inside of the hip joint near the groin, or on the outside near the buttocks and upper thigh. You may also have swelling or stiffness in your hip area. Follow these instructions at home: Managing pain, stiffness, and swelling      If directed, put ice on the painful area. To do this: ? Put ice in a plastic bag. ? Place a towel between your skin and the bag. ? Leave the ice on for 20 minutes, 2-3 times a day.  If directed, apply heat to the affected area as often as told by your health care provider. Use the heat source that your health care provider recommends, such as a moist heat pack or a heating pad. ? Place a towel between your skin and the heat source. ? Leave the heat on for 20-30 minutes. ? Remove the heat if your skin turns bright red. This is especially important if you are unable to feel pain, heat, or cold. You may have a greater risk of getting burned. Activity  Do exercises as told by your health care provider.  Avoid activities that cause pain. General instructions   Take over-the-counter and prescription medicines only as told by your health care provider.  Keep a journal of your symptoms. Write down: ? How often you have hip pain. ? The location of your pain. ? What the pain feels like. ? What makes the pain worse.  Sleep with a pillow between your legs on your most comfortable side.  Keep all follow-up visits as told by your health care provider. This is important. Contact a health care provider if:  You cannot put weight on your leg.  Your pain or swelling continues or gets worse after one week.  It gets harder to walk.  You have a fever. Get help right away  if:  You fall.  You have a sudden increase in pain and swelling in your hip.  Your hip is red or swollen or very tender to touch. Summary  Hip pain can range from a minor ache to severe pain in one or both of your hips.  The pain may be felt on the inside of the hip joint near the groin, or on the outside near the buttocks and upper thigh.  Avoid activities that cause pain.  Write down how often you have hip pain, the location of the pain, what makes it worse, and what it feels like. This information is not intended to replace advice given to you by your health care provider. Make sure you discuss any questions you have with your health care provider. Document Revised: 08/29/2018 Document Reviewed: 08/29/2018 Elsevier Patient Education  2020 [employer] Inc[employer] -- 

## 2019-10-24 ENCOUNTER — Encounter: Payer: Self-pay | Admitting: Family
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
## 2019-10-31 ENCOUNTER — Ambulatory Visit
Admission: RE | Admit: 2019-10-31 | Discharge: 2019-10-31 | Disposition: A | Discharge status: Home / Self Care | Payer: Medicare HMO | Source: Ambulatory Visit | Attending: Family Medicine | Admitting: Family Medicine

## 2019-10-31 ENCOUNTER — Other Ambulatory Visit: Payer: Self-pay

## 2019-10-31 DIAGNOSIS — Z13820 Encounter for screening for osteoporosis: Secondary | ICD-10-CM

## 2019-11-01 ENCOUNTER — Encounter: Payer: Self-pay | Admitting: Family Medicine

## 2019-11-01 DIAGNOSIS — M8580 Other specified disorders of bone density and structure, unspecified site: Secondary | ICD-10-CM | POA: Insufficient documentation

## 2019-11-02 ENCOUNTER — Telehealth: Payer: Self-pay | Admitting: Podiatry

## 2019-11-02 NOTE — Telephone Encounter (Signed)
Pt called and stated that shes having pain from injection site and wanted to know what to do

## 2019-11-02 NOTE — Telephone Encounter (Signed)
I spoke with pt and asked if she had another episode of sudden pain and she states no just the bruising and discomfort at the injection site, moving up the ankle now and in to the top of the foot. I told pt that she should be seen again she may have something new and to rest, ice and elevate.

## 2019-11-13 ENCOUNTER — Ambulatory Visit: Payer: Medicare HMO | Admitting: Podiatry

## 2019-11-13 ENCOUNTER — Other Ambulatory Visit: Payer: Self-pay

## 2019-11-13 ENCOUNTER — Encounter: Payer: Self-pay | Admitting: Podiatry

## 2019-11-13 DIAGNOSIS — M25572 Pain in left ankle and joints of left foot: Secondary | ICD-10-CM

## 2019-11-13 DIAGNOSIS — M722 Plantar fascial fibromatosis: Secondary | ICD-10-CM | POA: Diagnosis not present

## 2019-11-15 NOTE — Progress Notes (Signed)
Subjective:   Patient ID: [name], female   DOB: 69 y.o.   MRN: [id number]   HPI Patient states I am concerned about some possible swelling in my foot and also just the pain that I had in the ankle in the past.  States I do not remember specific injury   ROS      Objective:  Physical Exam  Neurovascular status intact with moderate discomfort in the left sinus tarsi with fluid buildup and mild to moderate discomfort plantar fascial right improved but present     Assessment:  2 separate problems 1 being inflammatory inflammation of the sinus tarsi left secondarily plantar fasciitis mild nature right     Plan:  H&P reviewed both conditions and I recommended utilization of anti-inflammatories supportive shoe gear topical medicines and physical therapy.  If symptoms persist we will need to consider a more aggressive treatment plan

## 2019-11-29 ENCOUNTER — Other Ambulatory Visit: Payer: Self-pay

## 2019-11-29 ENCOUNTER — Ambulatory Visit: Payer: Medicare HMO | Admitting: Pulmonary Disease

## 2019-11-29 ENCOUNTER — Encounter: Payer: Self-pay | Admitting: Pulmonary Disease

## 2019-11-29 VITALS — BP 124/82 | HR 82 | Temp 98.1°F | Ht 67.0 in | Wt 183.0 lb

## 2019-11-29 DIAGNOSIS — R05 Cough: Secondary | ICD-10-CM

## 2019-11-29 DIAGNOSIS — Z720 Tobacco use: Secondary | ICD-10-CM | POA: Diagnosis not present

## 2019-11-29 DIAGNOSIS — R059 Cough, unspecified: Secondary | ICD-10-CM

## 2019-11-29 NOTE — Patient Instructions (Addendum)
Thank you for visiting Dr. Valeta Harms at [facility]. Today we recommend the following:  Orders Placed This Encounter  Procedures  . Ambulatory Referral for Lung Cancer Scre   You must quit smoking or vaping. This is the single most important thing that you can do to improve your lung health.   S = Set a quit date. T = Tell family, friends, and the people around you that you plan to quit. A = Anticipate or plan ahead for the tough times you'll face while quitting. R = Remove cigarettes and other tobacco products from your home, car, and work T = Talk to [place] about getting help to quit  If you need help feel free to reach out to our office, [place] [facility]ess[facility]: [phone], call 1-800-QUIT-NOW, or visit [email or web address]  Follow up with [place] as needed.    Please do your part to reduce the spread of COVID-19.

## 2019-11-29 NOTE — Progress Notes (Signed)
Synopsis: Referred in august 2021 for chronic cough by Ronnald Nian, DO  Subjective:   PATIENT ID: [name] Titzer GENDER: female DOB: Sep 11, 1950, MRN: [id number]  Chief Complaint  Patient presents with  . Consult    Chronic cough with clear sputum    Long list of PMH, see below. In 2019, she states that she had an illness that started her coughing. She feels like she has not stopped. She feels like she had bronchitis. She was treated with abxs. She has cough now, productive, clear, randomly throughout the day, worse in the morning. Currently not taking anything for it. She takes hot honey water when she is congested. She feels like its related to allergies. No GERD symptoms. Lots of allergies. She saw an allergist in grade school.  OV 11/29/2019: When questioned the patient seems to have no idea why she is here.  She states that she was sent here by her PCP.  And she is expecting to have some kind of test done today.  I told her that I had no tests for her plan today and she was very upset about this.  I explained that she was referred for chronic cough and she states that her primary care provider referred her for some form of an x-ray.  She was at least willing to talk a little bit about her chronic cough.  This appears to have been going on since an illness in 2019.  Please see description above.  She is also a current smoker.  Currently smoking 2 packs/day.  No prior pulmonary function tests to review.  She has smoked since age 74.  Currently her max is 2 packs/day but most of the time was 1 pack/day.   Past Medical History:  Diagnosis Date  . Abdominal pain, epigastric   . Abdominal pain, epigastric   . Abdominal pain, right lower quadrant   . Abnormal weight gain   . Abnormality of gait   . Anxiety state, unspecified   . Asymptomatic varicose veins   . Blood vessel replaced by other means   . Cramp of limb   . Disorder of bone and cartilage, unspecified   . Dizziness and  giddiness   . Edema   . Encounter for long-term (current) use of other medications   . Herpes zoster without mention of complication   . Herpes zoster without mention of complication   . Hypersomnia, unspecified   . Insomnia, unspecified   . Long term (current) use of anticoagulants   . Lumbago   . Lumbago   . Muscle weakness (generalized)   . Myalgia and myositis, unspecified   . Obesity, unspecified   . Optic neuritis   . Other abnormal blood chemistry   . Other and unspecified hyperlipidemia   . Other cataract   . Pain in joint, site unspecified   . Pain in limb   . Pain in limb   . Palpitations   . Phlebitis and thrombophlebitis of lower extremities, unspecified   . Phlebitis and thrombophlebitis of other deep vessels of lower extremities   . Rash and other nonspecific skin eruption   . Spasm of muscle   . Syncope and collapse   . Tobacco use disorder   . Unspecified essential hypertension   . Unspecified hypothyroidism   . Unspecified sinusitis (chronic)   . Unspecified tinnitus      Family History  Problem Relation Age of Onset  . Stroke Mother   . Stroke Father   .  Heart disease Father   . Stroke Brother      Past Surgical History:  Procedure Laterality Date  . CATARACT EXTRACTION, BILATERAL    . EYE SURGERY     zag laser procedure.  Marland Kitchen [place] IMPLANT  2008   DR DICKERSON  . HIP SURGERY LEFT  03/2010   DR [facility]  . HIP SURGERY RIGHT  06/2010   DR [facility]  . LEFT SHOULDER  2012   DR [facility]   . LOWER BACK  05/26/2007   DR JEFF BEANE  . RIGHT BREAST BIOPSY  1986  . RIGHT KNEE  1971    Social History   Socioeconomic History  . Marital status: Widowed    Spouse name: [name]  . Number of children: 0  . Years of education: college  . Highest education level: Not on file  Occupational History    Employer: [employer]    Comment: answers phones  Tobacco Use  . Smoking status: Heavy Tobacco Smoker    Packs/day:  1.00    Types: Cigarettes  . Smokeless tobacco: Never Used  . Tobacco comment: one pack Daily Maybe more.  Vaping Use  . Vaping Use: Never used  Substance and Sexual Activity  . Alcohol use: Yes  . Drug use: No  . Sexual activity: Not Currently    Partners: Male    Comment: husband  Other Topics Concern  . Not on file  Social History Narrative   Patient  Lives at home wit her husband [name]) . Patient still works.college education.   Right handed.   Caffiene -None   Social Determinants of Health   Financial Resource Strain:   . Difficulty of Paying Living Expenses:   Food Insecurity:   . Worried About Charity fundraiser in the Last Year:   . Arboriculturist in the Last Year:   Transportation Needs:   . Film/video editor (Medical):   Marland Kitchen Lack of Transportation (Non-Medical):   Physical Activity:   . Days of Exercise per Week:   . Minutes of Exercise per Session:   Stress:   . Feeling of Stress :   Social Connections:   . Frequency of Communication with Friends and Family:   . Frequency of Social Gatherings with Friends and Family:   . Attends Religious Services:   . Active Member of Clubs or Organizations:   . Attends Archivist Meetings:   Marland Kitchen Marital Status:   Intimate Partner Violence:   . Fear of Current or Ex-Partner:   . Emotionally Abused:   Marland Kitchen Physically Abused:   . Sexually Abused:      Allergies  Allergen Reactions  . Cortizone-10 [Hydrocortisone] Hives  . Other Anaphylaxis    Beans,carbonation, dust and mold,floride,fried foods, household cleaner, melon,mushrooms,peppers,preservatives,rasisns,spicey,spinach,trees,milk products,shellfish  . Shellfish Allergy Other (See Comments) and Anaphylaxis    Death  . Synthroid [Levothyroxine Sodium] Anaphylaxis    Internal shaking   . Azithromycin Hives  . Betadine [Povidone Iodine]     Aspiration   . Calcium Channel Blockers     Asthma  . Codeine     Flu-like symptoms   . Duloxetine Itching  and Nausea Only    Dry mouth  . Fluoride Preparations Other (See Comments)    Blisters  . Iodine Other (See Comments)    Aspiration   . Levaquin [Levofloxacin In D5w]   . Lyrica [Pregabalin] Other (See Comments)    Make her very mean and angery  .  Novocain [Procaine] Other (See Comments)    Heart Palpations  . Peanuts [Peanut Oil] Swelling    **Allergy to ALL NUTS** Face Swelling   . Penicillins   . Pollen Extract Other (See Comments)    Hemorrhage   . Prozac [Fluoxetine Hcl]   . Wellbutrin [Bupropion] Hives    After 1 pill fist size bumps appeared  . Lanolin Rash  . Molds & Smuts Other (See Comments)  . Mushroom Extract Complex Nausea And Vomiting     Outpatient Medications Prior to Visit  Medication Sig Dispense Refill  . aspirin 325 MG tablet Take 325 mg by mouth daily.     . Cholecalciferol (VITAMIN D3 PO) Take 1 tablet by mouth daily.    Marland Kitchen EPINEPHrine 0.3 mg/0.3 mL IJ SOAJ injection Inject 0.3 mLs (0.3 mg total) into the muscle as needed for anaphylaxis. 2 each 1  . Polyethyl Glycol-Propyl Glycol (SYSTANE PRESERVATIVE FREE OP) Apply to eye.    Marland Kitchen TIROSINT 75 MCG CAPS Take 1 capsule (75 mcg total) by mouth daily. 90 capsule 3  . triamcinolone ointment (KENALOG) 0.1 % triamcinolone acetonide 0.1 % topical ointment  APPLY A THIN LAYER TO THE AFFECTED vulvar AREA(S) BY TOPICAL ROUTE daily    . clindamycin (CLEOCIN) 150 MG capsule Take 150 mg by mouth 3 (three) times daily.    Marland Kitchen Dextran 70-Hypromellose (ARTIFICIAL TEARS) 0.1-0.3 % SOLN Artificial Tears    . Dextran 70-Hypromellose 0.1-0.3 % SOLN 1 drop every 4 (four) hours as needed for Dry Eyes     No facility-administered medications prior to visit.    Review of Systems  Constitutional: Negative for chills, fever, malaise/fatigue and weight loss.  HENT: Negative for hearing loss, sore throat and tinnitus.   Eyes: Negative for blurred vision and double vision.  Respiratory: Positive for cough. Negative for hemoptysis,  sputum production, shortness of breath, wheezing and stridor.   Cardiovascular: Negative for chest pain, palpitations, orthopnea, leg swelling and PND.  Gastrointestinal: Negative for abdominal pain, constipation, diarrhea, heartburn, nausea and vomiting.  Genitourinary: Negative for dysuria, hematuria and urgency.  Musculoskeletal: Positive for myalgias. Negative for joint pain.  Skin: Negative for itching and rash.  Neurological: Negative for dizziness, tingling, weakness and headaches.  Endo/Heme/Allergies: Negative for environmental allergies. Does not bruise/bleed easily.  Psychiatric/Behavioral: Negative for depression. The patient is not nervous/anxious and does not have insomnia.   All other systems reviewed and are negative.    Objective:  Physical Exam Vitals reviewed.  Constitutional:      General: She is not in acute distress.    Appearance: She is well-developed.  HENT:     Head: Normocephalic and atraumatic.  Eyes:     General: No scleral icterus.    Conjunctiva/sclera: Conjunctivae normal.     Pupils: Pupils are equal, round, and reactive to light.  Neck:     Vascular: No JVD.     Trachea: No tracheal deviation.  Cardiovascular:     Rate and Rhythm: Normal rate and regular rhythm.     Heart sounds: Normal heart sounds. No murmur heard.   Pulmonary:     Effort: Pulmonary effort is normal. No tachypnea, accessory muscle usage or respiratory distress.     Breath sounds: No stridor. No wheezing, rhonchi or rales.  Abdominal:     General: Bowel sounds are normal. There is no distension.     Palpations: Abdomen is soft.     Tenderness: There is no abdominal tenderness.  Musculoskeletal:  General: Tenderness present.     Cervical back: Neck supple.     Comments: Patient had significant amount of pain with palpation of the left anterior shin in which she removed her leg quickly and would not allow me to examine any further.  Lymphadenopathy:     Cervical: No  cervical adenopathy.  Skin:    General: Skin is warm and dry.     Capillary Refill: Capillary refill takes less than 2 seconds.     Findings: No rash.  Neurological:     Mental Status: She is alert and oriented to person, place, and time.  Psychiatric:        Behavior: Behavior normal.      Vitals:   11/29/19 1344  BP: 124/82  Pulse: 82  Temp: 98.1 F (36.7 C)  TempSrc: Oral  SpO2: 97%  Weight: 183 lb (83 kg)  Height: 5\' 7"  (1.702 m)   97% on RA BMI Readings from Last 3 Encounters:  11/29/19 28.66 kg/m  10/23/19 29.99 kg/m  08/30/19 28.92 kg/m   Wt Readings from Last 3 Encounters:  11/29/19 183 lb (83 kg)  10/23/19 185 lb 12.8 oz (84.3 kg)  08/30/19 179 lb 3.2 oz (81.3 kg)    CBC    Component Value Date/Time   WBC 8.4 08/30/2019 1111   RBC 5.10 08/30/2019 1111   HGB 14.9 08/30/2019 1111   HGB 14.8 11/25/2016 1319   HCT 43.7 08/30/2019 1111   HCT 44.2 11/25/2016 1319   PLT 243.0 08/30/2019 1111   PLT 273 11/25/2016 1319   MCV 85.5 08/30/2019 1111   MCV 84 11/25/2016 1319   MCH 28.0 11/25/2016 1319   MCH 26.8 07/07/2010 0450   MCHC 34.1 08/30/2019 1111   RDW 13.0 08/30/2019 1111   RDW 14.7 11/25/2016 1319   LYMPHSABS 2.3 11/25/2016 1319   EOSABS 0.1 11/25/2016 1319   BASOSABS 0.1 11/25/2016 1319    Chest Imaging: CXR 05/2018: Evidence of chronic and bronchiectatic changes The patient's images have been independently reviewed by me.    Pulmonary Functions Testing Results: No flowsheet data found.  FeNO:   Pathology:   Echocardiogram:   Heart Catheterization:     Assessment & Plan:     ICD-10-CM   1. Cough  R05   2. Tobacco abuse  Z72.0 Ambulatory Referral for Lung Cancer Scre   Discussion:  This is a 69 year old female with cough.  She also is a longstanding tobacco abuse history.  Would be a good candidate for lung cancer screening program.  She seems to be very upset today that she was referred to the pulmonary clinic and is not  getting any kind of testing done today.  Plan: Patient desires not to follow-up with our clinic. Patient was counseled on smoking cessation.  I think this will be the most important thing to help prevent her cough. She does have significant environmental allergies and we discussed use of other medications to help this but she states that she is not taking any medications because she has too many reactions to them. We will refer to the lung cancer screening program as this appears to be something that she is interested in.  Greater than 50% of this patient's 45-minute office was spent face-to-face discussing above recommendations and treatment plan.   Current Outpatient Medications:  .  aspirin 325 MG tablet, Take 325 mg by mouth daily. , Disp: , Rfl:  .  Cholecalciferol (VITAMIN D3 PO), Take 1 tablet by mouth daily.,  Disp: , Rfl:  .  EPINEPHrine 0.3 mg/0.3 mL IJ SOAJ injection, Inject 0.3 mLs (0.3 mg total) into the muscle as needed for anaphylaxis., Disp: 2 each, Rfl: 1 .  Polyethyl Glycol-Propyl Glycol (SYSTANE PRESERVATIVE FREE OP), Apply to eye., Disp: , Rfl:  .  TIROSINT 75 MCG CAPS, Take 1 capsule (75 mcg total) by mouth daily., Disp: 90 capsule, Rfl: 3 .  triamcinolone ointment (KENALOG) 0.1 %, triamcinolone acetonide 0.1 % topical ointment  APPLY A THIN LAYER TO THE AFFECTED vulvar AREA(S) BY TOPICAL ROUTE daily, Disp: , Rfl:  .  clindamycin (CLEOCIN) 150 MG capsule, Take 150 mg by mouth 3 (three) times daily., Disp: , Rfl:  .  Dextran 70-Hypromellose (ARTIFICIAL TEARS) 0.1-0.3 % SOLN, Artificial Tears, Disp: , Rfl:  .  Dextran 70-Hypromellose 0.1-0.3 % SOLN, 1 drop every 4 (four) hours as needed for Dry Eyes, Disp: , Rfl:    Garner Nash, DO [facility] 11/29/2019 1:58 PM

## 2019-12-26 ENCOUNTER — Telehealth: Payer: Self-pay | Admitting: Acute Care

## 2019-12-26 DIAGNOSIS — F17210 Nicotine dependence, cigarettes, uncomplicated: Secondary | ICD-10-CM

## 2019-12-26 DIAGNOSIS — Z87891 Personal history of nicotine dependence: Secondary | ICD-10-CM

## 2019-12-27 NOTE — Telephone Encounter (Signed)
LMTC x 1  

## 2019-12-28 NOTE — Telephone Encounter (Signed)
Pt is returning call to [facility] - please call back at [phone]

## 2019-12-29 NOTE — Telephone Encounter (Signed)
Spoke with pt and scheduled SDMV 01/24/20 1:30 CT ordered Nothing further needed

## 2019-12-29 NOTE — Telephone Encounter (Signed)
LMTC x 1  

## 2019-12-29 NOTE — Telephone Encounter (Signed)
Pt is returning call from [facility] - [phone]

## 2020-01-09 ENCOUNTER — Telehealth: Payer: Self-pay | Admitting: Family Medicine

## 2020-01-09 NOTE — Telephone Encounter (Signed)
Left message for patient to schedule Annual Wellness Visit.  Please schedule with Nurse Health Advisor Martha Stanley, RN at [facility]  °

## 2020-01-24 ENCOUNTER — Encounter: Payer: Self-pay | Admitting: Acute Care

## 2020-01-24 ENCOUNTER — Other Ambulatory Visit: Payer: Self-pay

## 2020-01-24 ENCOUNTER — Ambulatory Visit (INDEPENDENT_AMBULATORY_CARE_PROVIDER_SITE_OTHER)
Admission: RE | Admit: 2020-01-24 | Discharge: 2020-01-24 | Disposition: A | Discharge status: Home / Self Care | Payer: Medicare HMO | Source: Ambulatory Visit | Attending: Acute Care | Admitting: Acute Care

## 2020-01-24 ENCOUNTER — Ambulatory Visit (INDEPENDENT_AMBULATORY_CARE_PROVIDER_SITE_OTHER): Payer: Medicare HMO | Admitting: Acute Care

## 2020-01-24 VITALS — BP 120/82 | HR 85 | Temp 97.7°F | Ht 67.5 in | Wt 181.4 lb

## 2020-01-24 DIAGNOSIS — F17210 Nicotine dependence, cigarettes, uncomplicated: Secondary | ICD-10-CM

## 2020-01-24 DIAGNOSIS — Z122 Encounter for screening for malignant neoplasm of respiratory organs: Secondary | ICD-10-CM

## 2020-01-24 DIAGNOSIS — Z87891 Personal history of nicotine dependence: Secondary | ICD-10-CM

## 2020-01-24 NOTE — Progress Notes (Signed)
Shared Decision Making Visit Lung Cancer Screening Program [phone])   Eligibility:  Age 69 y.o.  Pack Years Smoking History Calculation 78 pack year smoking history (# packs/per year x # years smoked)  Recent History of coughing up blood  no  Unexplained weight loss? no ( >Than 15 pounds within the last 6 months )  Prior History Lung / other cancer no (Diagnosis within the last 5 years already requiring surveillance chest CT Scans).  Smoking Status Current Smoker  Former Smokers: Years since quit: NA  Quit Date: NA  Visit Components:  Discussion included one or more decision making aids. yes  Discussion included risk/benefits of screening. yes  Discussion included potential follow up diagnostic testing for abnormal scans. yes  Discussion included meaning and risk of over diagnosis. yes  Discussion included meaning and risk of False Positives. yes  Discussion included meaning of total radiation exposure. yes  Counseling Included:  Importance of adherence to annual lung cancer LDCT screening. yes  Impact of comorbidities on ability to participate in the program. yes  Ability and willingness to under diagnostic treatment. yes  Smoking Cessation Counseling:  Current Smokers:   Discussed importance of smoking cessation. yes  Information about tobacco cessation classes and interventions provided to patient. yes  Patient provided with "ticket" for LDCT Scan. yes  Symptomatic Patient. no  Counseling  Diagnosis Code: Tobacco Use Z72.0  Asymptomatic Patient yes  Counseling (Intermediate counseling: > three minutes counseling) Q1194  Former Smokers:   Discussed the importance of maintaining cigarette abstinence. yes  Diagnosis Code: Personal History of Nicotine Dependence. R74.081  Information about tobacco cessation classes and interventions provided to patient. Yes  Patient provided with "ticket" for LDCT Scan. yes  Written Order for Lung Cancer  Screening with LDCT placed in Epic. Yes (CT Chest Lung Cancer Screening Low Dose W/O CM) [id number] Z12.2-Screening of respiratory organs Z87.891-Personal history of nicotine dependence  BP 120/82 (BP Location: Left Arm, Cuff Size: Normal)   Pulse 85   Temp 97.7 F (36.5 C) (Oral)   Ht 5' 7.5" (1.715 m)   Wt 181 lb 6.4 oz (82.3 kg)   SpO2 94%   BMI 27.99 kg/m    I have spent 25 minutes of face to face time with Ms. Mcmanamon discussing the risks and benefits of lung cancer screening. We viewed a power point together that explained in detail the above noted topics. We paused at intervals to allow for questions to be asked and answered to ensure understanding.We discussed that the single most powerful action that she can take to decrease her risk of developing lung cancer is to quit smoking. We discussed whether or not she is ready to commit to setting a quit date. We discussed options for tools to aid in quitting smoking including nicotine replacement therapy, non-nicotine medications, support groups, Quit Smart classes, and behavior modification. We discussed that often times setting smaller, more achievable goals, such as eliminating 1 cigarette a day for a week and then 2 cigarettes a day for a week can be helpful in slowly decreasing the number of cigarettes smoked. This allows for a sense of accomplishment as well as providing a clinical benefit. I gave her the " Be Stronger Than Your Excuses" card with contact information for community resources, classes, free nicotine replacement therapy, and access to mobile apps, text messaging, and on-line smoking cessation help. I have also given her my card and contact information in the event she needs to contact me. We discussed the  time and location of the scan, and that either Doroteo Glassman RN or I will call with the results within 24-48 hours of receiving them. I have offered her  a copy of the power point we viewed  as a resource in the event they need  reinforcement of the concepts we discussed today in the office. The patient verbalized understanding of all of  the above and had no further questions upon leaving the office. They have my contact information in the event they have any further questions.  I spent 3 minutes counseling on smoking cessation and the health risks of continued tobacco abuse.  I explained to the patient that there has been a high incidence of coronary artery disease noted on these exams. I explained that this is a non-gated exam therefore degree or severity cannot be determined. This patient is not currently on statin therapy. I have asked the patient to follow-up with their PCP regarding any incidental finding of coronary artery disease and management with diet or medication as their PCP  feels is clinically indicated. The patient verbalized understanding of the above and had no further questions upon completion of the visit.      Magdalen Spatz, NP 01/24/2020

## 2020-01-24 NOTE — Patient Instructions (Signed)
Thank you for participating in the [facility] Screening Program. It was our pleasure to meet you today. We will call you with the results of your scan within the next few days. Your scan will be assigned a Lung RADS category score by the physicians reading the scans.  This Lung RADS score determines follow up scanning.  See below for description of categories, and follow up screening recommendations. We will be in touch to schedule your follow up screening annually or based on recommendations of our providers. We will fax a copy of your scan results to your Primary Care Physician, or the physician who referred you to the program, to ensure they have the results. Please call the office if you have any questions or concerns regarding your scanning experience or results.  Our office number is [phone]. Please speak with Denise Phelps, RN. She is our Lung Cancer Screening RN. If she is unavailable when you call, please have the office staff send her a message. She will return your call at her earliest convenience. Remember, if your scan is normal, we will scan you annually as long as you continue to meet the criteria for the program. (Age 55-77, Current smoker or smoker who has quit within the last 15 years). If you are a smoker, remember, quitting is the single most powerful action that you can take to decrease your risk of lung cancer and other pulmonary, breathing related problems. We know quitting is hard, and we are here to help.  Please let us know if there is anything we can do to help you meet your goal of quitting. If you are a former smoker, congratulations. We are proud of you! Remain smoke free! Remember you can refer friends or family members through the number above.  We will screen them to make sure they meet criteria for the program. Thank you for helping us take better care of you by participating in Lung Screening.  Lung RADS Categories:  Lung RADS 1: no nodules  or definitely non-concerning nodules.  Recommendation is for a repeat annual scan in 12 months.  Lung RADS 2:  nodules that are non-concerning in appearance and behavior with a very low likelihood of becoming an active cancer. Recommendation is for a repeat annual scan in 12 months.  Lung RADS 3: nodules that are probably non-concerning , includes nodules with a low likelihood of becoming an active cancer.  Recommendation is for a 6-month repeat screening scan. Often noted after an upper respiratory illness. We will be in touch to make sure you have no questions, and to schedule your 6-month scan.  Lung RADS 4 A: nodules with concerning findings, recommendation is most often for a follow up scan in 3 months or additional testing based on our provider's assessment of the scan. We will be in touch to make sure you have no questions and to schedule the recommended 3 month follow up scan.  Lung RADS 4 B:  indicates findings that are concerning. We will be in touch with you to schedule additional diagnostic testing based on our provider's  assessment of the scan.   

## 2020-01-25 NOTE — Progress Notes (Signed)
Please call patient and let them  know their  low dose Ct was read as a Lung RADS 2: nodules that are benign in appearance and behavior with a very low likelihood of becoming a clinically active cancer due to size or lack of growth. Recommendation per radiology is for a repeat LDCT in 12 months. .Please let them  know we will order and schedule their  annual screening scan for 12/2020. Please let them  know there was notation of CAD on their  scan.  Please remind the patient  that this is a non-gated exam therefore degree or severity of disease  cannot be determined. Please have them  follow up with their PCP regarding potential risk factor modification, dietary therapy or pharmacologic therapy if clinically indicated. °Pt.  is  not currently on statin therapy. Please place order for annual  screening scan for  12/2020 and fax results to PCP. Thanks so much. °

## 2020-01-26 ENCOUNTER — Other Ambulatory Visit: Payer: Self-pay | Admitting: *Deleted

## 2020-01-26 DIAGNOSIS — Z87891 Personal history of nicotine dependence: Secondary | ICD-10-CM

## 2020-01-26 DIAGNOSIS — F17210 Nicotine dependence, cigarettes, uncomplicated: Secondary | ICD-10-CM

## 2020-02-07 ENCOUNTER — Telehealth: Payer: Self-pay

## 2020-02-07 NOTE — Telephone Encounter (Signed)
Handicap placard form filed out by provider, copy made and original placed up front for patient to pickup.  Notified patient VIA phone. Dm/cma

## 2020-03-28 ENCOUNTER — Telehealth: Payer: Self-pay | Admitting: Family Medicine

## 2020-03-28 NOTE — Telephone Encounter (Signed)
Left message for patient to schedule Annual Wellness Visit.  Please schedule with Nurse Health Advisor Martha Stanley, RN at [facility]  °

## 2020-04-25 ENCOUNTER — Telehealth: Payer: Self-pay

## 2020-04-25 NOTE — Telephone Encounter (Signed)
PA for Tirosint Capsule approved through [place] from 04/27/2020-04/26/21. Dm/cma

## 2020-05-28 DIAGNOSIS — L570 Actinic keratosis: Secondary | ICD-10-CM | POA: Diagnosis not present

## 2020-05-28 DIAGNOSIS — D485 Neoplasm of uncertain behavior of skin: Secondary | ICD-10-CM | POA: Diagnosis not present

## 2020-05-28 DIAGNOSIS — C44629 Squamous cell carcinoma of skin of left upper limb, including shoulder: Secondary | ICD-10-CM | POA: Diagnosis not present

## 2020-06-18 ENCOUNTER — Other Ambulatory Visit: Payer: Self-pay | Admitting: Family Medicine

## 2020-06-18 DIAGNOSIS — C44629 Squamous cell carcinoma of skin of left upper limb, including shoulder: Secondary | ICD-10-CM | POA: Diagnosis not present

## 2020-06-18 HISTORY — PX: FINGER SURGERY: SHX640

## 2020-07-14 ENCOUNTER — Emergency Department (HOSPITAL_COMMUNITY)
Admission: EM | Admit: 2020-07-14 | Discharge: 2020-07-14 | Disposition: A | Discharge status: Home / Self Care | Payer: Medicare HMO | Attending: Emergency Medicine | Admitting: Emergency Medicine

## 2020-07-14 ENCOUNTER — Other Ambulatory Visit: Payer: Self-pay

## 2020-07-14 ENCOUNTER — Ambulatory Visit (HOSPITAL_COMMUNITY)
Admission: EM | Admit: 2020-07-14 | Discharge: 2020-07-14 | Disposition: A | Discharge status: Other Acute Inpatient Hospital | Payer: Medicare HMO | Attending: Emergency Medicine | Admitting: Emergency Medicine

## 2020-07-14 ENCOUNTER — Emergency Department (HOSPITAL_COMMUNITY): Payer: Medicare HMO

## 2020-07-14 ENCOUNTER — Encounter (HOSPITAL_COMMUNITY): Payer: Self-pay | Admitting: Emergency Medicine

## 2020-07-14 DIAGNOSIS — F17210 Nicotine dependence, cigarettes, uncomplicated: Secondary | ICD-10-CM | POA: Diagnosis not present

## 2020-07-14 DIAGNOSIS — Z7982 Long term (current) use of aspirin: Secondary | ICD-10-CM | POA: Diagnosis not present

## 2020-07-14 DIAGNOSIS — S0181XA Laceration without foreign body of other part of head, initial encounter: Secondary | ICD-10-CM | POA: Diagnosis not present

## 2020-07-14 DIAGNOSIS — R519 Headache, unspecified: Secondary | ICD-10-CM | POA: Insufficient documentation

## 2020-07-14 DIAGNOSIS — Z043 Encounter for examination and observation following other accident: Secondary | ICD-10-CM | POA: Diagnosis not present

## 2020-07-14 DIAGNOSIS — Z5321 Procedure and treatment not carried out due to patient leaving prior to being seen by health care provider: Secondary | ICD-10-CM | POA: Diagnosis not present

## 2020-07-14 DIAGNOSIS — M25552 Pain in left hip: Secondary | ICD-10-CM | POA: Insufficient documentation

## 2020-07-14 DIAGNOSIS — R69 Illness, unspecified: Secondary | ICD-10-CM | POA: Diagnosis not present

## 2020-07-14 DIAGNOSIS — M25551 Pain in right hip: Secondary | ICD-10-CM | POA: Insufficient documentation

## 2020-07-14 DIAGNOSIS — M25562 Pain in left knee: Secondary | ICD-10-CM | POA: Diagnosis not present

## 2020-07-14 DIAGNOSIS — M79675 Pain in left toe(s): Secondary | ICD-10-CM | POA: Diagnosis not present

## 2020-07-14 DIAGNOSIS — M79672 Pain in left foot: Secondary | ICD-10-CM | POA: Diagnosis not present

## 2020-07-14 DIAGNOSIS — W19XXXA Unspecified fall, initial encounter: Secondary | ICD-10-CM

## 2020-07-14 NOTE — ED Notes (Signed)
Pt transported to xray 

## 2020-07-14 NOTE — ED Notes (Signed)
.  Patient is being discharged from the Urgent Care and sent to the Emergency Department via Dr. Alphonzo Cruise. Per Dr. Alphonzo Cruise, patient is in need of higher level of care due to injuries . Patient is aware and verbalizes understanding of plan of care. There were no vitals filed for this visit.

## 2020-07-14 NOTE — ED Triage Notes (Addendum)
Pt tripped and fell on carpeted concrete yesterday.  C/o pain to bilateral hips, L knee with abrasion, lac to center of forehead (appears superficial), partial removal of mole on L side of nose, pain to L toes, and tooth cut inside of mouth.  Ambulatory to triage.

## 2020-07-14 NOTE — ED Notes (Signed)
Pt did not want to change into a gown or be put on the monitor

## 2020-07-14 NOTE — ED Provider Notes (Signed)
[facility] EMERGENCY DEPARTMENT Provider Note   CSN: [id number] Arrival date & time: 07/14/20  1354     History Chief Complaint  Patient presents with  . Fall    [name] is a 70 y.o. female.  Patient is a 70 year old female with a history of bilateral hip replacements on any anticoagulation medications who presents after a fall.  Patient reports a fall happened yesterday.  She states that she tripped on the concrete and landed on her front side.  Specifically she had her face, fell on her outstretched arms, and slid on her left lower extremity.  Patient reports pain to the bilateral hips, left foot, left knee, and bilateral wrist.  Patient does report some pain on her forehead, however states that this was not the reason she presented to the emergency room today.  She states that after the fall happened yesterday she hurt only a little, however when she woke up today her pain was much worse.  Patient has a history of bilateral hip replacements in 1 to make sure they were okay.  Additionally patient reports bruising to her left toes and this concerned her for fracture so 1 to get them checked out.  Patient has been able to ambulate without difficulty but states that she does have some hip pain associated with it.  Patient denies any headaches, dizziness, changes in vision.  Patient denies any focal weakness or changes in sensation.  Patient has not tried any medications for pain.        Past Medical History:  Diagnosis Date  . Abdominal pain, epigastric   . Abdominal pain, epigastric   . Abdominal pain, right lower quadrant   . Abnormal weight gain   . Abnormality of gait   . Anxiety state, unspecified   . Asymptomatic varicose veins   . Blood vessel replaced by other means   . Cramp of limb   . Disorder of bone and cartilage, unspecified   . Dizziness and giddiness   . Edema   . Encounter for long-term (current) use of other medications   . Herpes  zoster without mention of complication   . Herpes zoster without mention of complication   . Hypersomnia, unspecified   . Insomnia, unspecified   . Long term (current) use of anticoagulants   . Lumbago   . Lumbago   . Muscle weakness (generalized)   . Myalgia and myositis, unspecified   . Obesity, unspecified   . Optic neuritis   . Other abnormal blood chemistry   . Other and unspecified hyperlipidemia   . Other cataract   . Pain in joint, site unspecified   . Pain in limb   . Pain in limb   . Palpitations   . Phlebitis and thrombophlebitis of lower extremities, unspecified   . Phlebitis and thrombophlebitis of other deep vessels of lower extremities   . Rash and other nonspecific skin eruption   . Spasm of muscle   . Syncope and collapse   . Tobacco use disorder   . Unspecified essential hypertension   . Unspecified hypothyroidism   . Unspecified sinusitis (chronic)   . Unspecified tinnitus     Patient Active Problem List   Diagnosis Date Noted  . Osteopenia 11/01/2019  . Herpes simplex 10/06/2019  . Hypersomnia 10/06/2019  . Hyperlipidemia 10/06/2019  . Obesity 10/06/2019  . Palpitations 10/06/2019  . Phlebitis 10/06/2019  . Sinusitis 10/06/2019  . Syncope 10/06/2019  . Tinnitus 10/06/2019  . Vitamin  D deficiency 09/08/2019  . Varicose veins of both legs with edema 08/31/2019  . Right arm pain 06/21/2018  . Bilateral leg pain 02/17/2018  . Anxiety 12/31/2017  . Cramp of toe 07/01/2016  . Lichen sclerosus of female genitalia 05/14/2016  . Low back pain 06/04/2015  . Chronic pain 05/01/2015  . Tobacco abuse 06/04/2014  . Asthma with bronchitis 06/04/2014  . H/O cardiovascular disorder 03/17/2014  . Anterior optic neuritis 03/17/2014  . Seasonal and perennial allergic rhinitis 03/06/2014  . H/O thrombosis 12/24/2013  . Personal history of venous thrombosis and embolism 12/24/2013  . Myalgia and myositis 08/23/2013  . Chronic cough 08/23/2013  . Non-toxic  uninodular goiter 03/07/2013  . Allergy 11/16/2012  . Hypothyroidism 08/31/2012  . Deep venous thrombosis ([place]) 04/27/2002    Past Surgical History:  Procedure Laterality Date  . CATARACT EXTRACTION, BILATERAL    . EYE SURGERY     zag laser procedure.  [facility] [place] IMPLANT  2008   DR DICKERSON  . HIP SURGERY LEFT  03/2010   DR [facility]  . HIP SURGERY RIGHT  06/2010   DR [facility]  . LEFT SHOULDER  2012   DR [facility]   . LOWER BACK  05/26/2007   DR JEFF BEANE  . RIGHT BREAST BIOPSY  1986  . RIGHT KNEE  1971     OB History   No obstetric history on file.     Family History  Problem Relation Age of Onset  . Stroke Mother   . Stroke Father   . Heart disease Father   . Stroke Brother     Social History   Tobacco Use  . Smoking status: Heavy Tobacco Smoker    Packs/day: 1.50    Years: 52.00    Pack years: 78.00    Types: Cigarettes  . Smokeless tobacco: Never Used  . Tobacco comment: 2 packs daily-01/24/2020  Vaping Use  . Vaping Use: Never used  Substance Use Topics  . Alcohol use: Yes  . Drug use: No    Home Medications Prior to Admission medications   Medication Sig Start Date End Date Taking? Authorizing Provider  aspirin 325 MG tablet Take 325 mg by mouth daily.     [provider]  Cholecalciferol (VITAMIN D3 PO) Take 1 tablet by mouth daily.    [provider]  EPINEPHRINE 0.3 mg/0.3 mL IJ SOAJ injection INJECT 0.3 MLS (0.3 MG TOTAL) INTO THE MUSCLE AS NEEDED FOR ANAPHYLAXIS. 06/19/20   Luetta Nutting, DO  Polyethyl Glycol-Propyl Glycol (SYSTANE PRESERVATIVE FREE OP) Apply to eye.    [provider]  TIROSINT 75 MCG CAPS Take 1 capsule (75 mcg total) by mouth daily. 10/18/19   Cirigliano, Garvin Fila, DO  triamcinolone cream (KENALOG) 0.1 % Apply topically 2 (two) times daily. 01/10/20   [provider]  triamcinolone ointment (KENALOG) 0.1 % triamcinolone acetonide 0.1 % topical ointment  APPLY A  THIN LAYER TO THE AFFECTED vulvar AREA(S) BY TOPICAL ROUTE daily    [provider]    Allergies    Cortizone-10 [hydrocortisone], Other, Shellfish allergy, Synthroid [levothyroxine sodium], Azithromycin, Betadine [povidone iodine], Calcium channel blockers, Codeine, Duloxetine, Fluoride preparations, Iodine, Levaquin [levofloxacin in d5w], Lyrica [pregabalin], Novocain [procaine], Peanuts [peanut oil], Penicillins, Pollen extract, Prozac [fluoxetine hcl], Wellbutrin [bupropion], Lanolin, Molds & smuts, and Mushroom extract complex  Review of Systems   Review of Systems  Constitutional: Negative for chills and fever.  HENT: Negative for ear pain and sore  throat.   Eyes: Negative for pain and visual disturbance.  Respiratory: Negative for cough and shortness of breath.   Cardiovascular: Negative for chest pain and palpitations.  Gastrointestinal: Negative for abdominal pain and vomiting.  Genitourinary: Negative for dysuria and hematuria.  Musculoskeletal: Negative for back pain and neck pain.  Skin: Positive for wound. Negative for color change.  Neurological: Negative for seizures and syncope.  All other systems reviewed and are negative.   Physical Exam Updated Vital Signs BP (!) 154/89 (BP Location: Left Arm)   Pulse 90   Temp 98.2 F (36.8 C)   Resp 18   SpO2 95%   Physical Exam Vitals and nursing note reviewed.  Constitutional:      General: She is not in acute distress.    Appearance: She is well-developed.  HENT:     Head: Normocephalic.     Comments: Small abrasion to the middle of the forehead, no hematoma noted    Right Ear: External ear normal.     Left Ear: External ear normal.     Nose: Nose normal.     Mouth/Throat:     Mouth: Mucous membranes are moist.     Pharynx: Oropharynx is clear.  Eyes:     Extraocular Movements: Extraocular movements intact.     Pupils: Pupils are equal, round, and reactive to light.  Neck:     Comments: No cervical  spine tenderness Cardiovascular:     Rate and Rhythm: Normal rate and regular rhythm.  Pulmonary:     Effort: Pulmonary effort is normal.     Breath sounds: Normal breath sounds.  Abdominal:     Palpations: Abdomen is soft.     Tenderness: There is no abdominal tenderness.  Musculoskeletal:     Cervical back: Neck supple.     Comments: No midline thoracic or lumbar spine tenderness Abrasion to the left knee Bruising to the left toes 5 out of 5 strength flexion extension of the left ankle, left knee, and left hip Have out of 5 strength flexion extension of the right hip Sensation intact throughout the bilateral lower extremities and upper extremities Equal and strong grip strength  Skin:    General: Skin is warm and dry.  Neurological:     Mental Status: She is alert.     ED Results / Procedures / Treatments   Labs (all labs ordered are listed, but only abnormal results are displayed) Labs Reviewed - No data to display  EKG None  Radiology DG Pelvis 1-2 Views  Result Date: 07/14/2020 CLINICAL DATA:  Fall, bilateral hip pain EXAM: PELVIS - 1-2 VIEW COMPARISON:  None. FINDINGS: Bilateral hip replacements noted. No hardware complicating feature. No acute bony abnormality. Specifically, no fracture, subluxation, or dislocation. SI joints symmetric and unremarkable. Degenerative changes in the visualized lower lumbar spine. IVC filter visualized. IMPRESSION: Bilateral hip replacements.  No acute bony abnormality. Electronically Signed   By: Rolm Baptise M.D.   On: 07/14/2020 15:41   DG Foot Complete Left  Result Date: 07/14/2020 CLINICAL DATA:  Fall, left foot pain EXAM: LEFT FOOT - COMPLETE 3+ VIEW COMPARISON:  None. FINDINGS: There is no evidence of fracture or dislocation. There is no evidence of arthropathy or other focal bone abnormality. Soft tissues are unremarkable. IMPRESSION: Negative. Electronically Signed   By: Rolm Baptise M.D.   On: 07/14/2020 15:40    Procedures Procedures   Medications Ordered in ED Medications - No data to display  ED Course  I  have reviewed the triage vital signs and the nursing notes.  Pertinent labs & imaging results that were available during my care of the patient were reviewed by me and considered in my medical decision making (see chart for details).    MDM Rules/Calculators/A&P                          Patient is a 70 year old female who presents with a chief complaint of a fall that occurred yesterday.  Does not take any anticoagulation medications.  Does not report any headache, dizziness, visual changes.  Has no focal weakness.  Patient states that she felt fine after the fall yesterday but woke up sore today and was concerned and came in for evaluation.  She reports bilateral wrist pain, left toe pain, left knee pain. X-rays ordered to evaluate patient's areas of injury.  X-ray results as above.  Prior to being evaluated by attending physician, patient left the emergency department and was no longer able to be found.  Suspected that patient eloped.  Final Clinical Impression(s) / ED Diagnoses Final diagnoses:  Fall    Rx / DC Orders ED Discharge Orders    None       Kugler, [place], MD 07/15/20 [id number]    Blanchie Dessert, MD 07/22/20 [phone]

## 2020-07-14 NOTE — ED Notes (Signed)
Pt not in room at this time, will continue to look for patient. Dr Maryan Rued aware

## 2020-07-20 ENCOUNTER — Emergency Department (HOSPITAL_COMMUNITY)
Admission: EM | Admit: 2020-07-20 | Discharge: 2020-07-20 | Disposition: A | Discharge status: Home / Self Care | Payer: Medicare HMO | Attending: Emergency Medicine | Admitting: Emergency Medicine

## 2020-07-20 DIAGNOSIS — Z91010 Allergy to peanuts: Secondary | ICD-10-CM | POA: Insufficient documentation

## 2020-07-20 DIAGNOSIS — M791 Myalgia, unspecified site: Secondary | ICD-10-CM | POA: Insufficient documentation

## 2020-07-20 DIAGNOSIS — Z7982 Long term (current) use of aspirin: Secondary | ICD-10-CM | POA: Diagnosis not present

## 2020-07-20 DIAGNOSIS — F17210 Nicotine dependence, cigarettes, uncomplicated: Secondary | ICD-10-CM | POA: Diagnosis not present

## 2020-07-20 DIAGNOSIS — I10 Essential (primary) hypertension: Secondary | ICD-10-CM | POA: Insufficient documentation

## 2020-07-20 DIAGNOSIS — R69 Illness, unspecified: Secondary | ICD-10-CM | POA: Diagnosis not present

## 2020-07-20 DIAGNOSIS — W19XXXA Unspecified fall, initial encounter: Secondary | ICD-10-CM | POA: Insufficient documentation

## 2020-07-20 MED ORDER — DICLOFENAC SODIUM 1 % EX GEL: 2.0000 g | Freq: Four times a day (QID) | CUTANEOUS | 1 refills | Status: DC

## 2020-07-20 NOTE — ED Notes (Signed)
Pt declined to sit in the bed in the room or place a gown on. Pt states she will be more comfortable in a chair. Pt settled into chair, not in gown at this time. RN aware.

## 2020-07-20 NOTE — ED Triage Notes (Signed)
Pt. Stated, I also have a sore on my knee and it needs to be looked at.

## 2020-07-20 NOTE — ED Triage Notes (Signed)
Pt. Stated I take and 324mg  ASA per day. I probably need a CT scan of my head

## 2020-07-20 NOTE — ED Triage Notes (Signed)
Pt. Stated, I fell last Saturday and I fell and I left the hospital I  got all upset and left, so Im still Im having a little headache , neck pain and jaw pain.

## 2020-07-20 NOTE — ED Provider Notes (Signed)
[facility] EMERGENCY DEPARTMENT Provider Note   CSN: [id number] Arrival date & time: 07/20/20  1247     History Chief Complaint  Patient presents with  . Fall  . Headache  . Neck Pain  . Jaw Pain    [name] is a 70 y.o. female.  HPI Patient is a 70 year old female presented today primarily because she feels badly about eloping from the ER 3/20.  She states that she had a fall and went to the urgent care was told she had to come to the ER.  She was evaluated in the ER and left after being evaluated and imaged with x-rays because she was tired and felt that she had been poked and probed enough  He has no symptoms presently apart from feeling somewhat achy.    Past Medical History:  Diagnosis Date  . Abdominal pain, epigastric   . Abdominal pain, epigastric   . Abdominal pain, right lower quadrant   . Abnormal weight gain   . Abnormality of gait   . Anxiety state, unspecified   . Asymptomatic varicose veins   . Blood vessel replaced by other means   . Cramp of limb   . Disorder of bone and cartilage, unspecified   . Dizziness and giddiness   . Edema   . Encounter for long-term (current) use of other medications   . Herpes zoster without mention of complication   . Herpes zoster without mention of complication   . Hypersomnia, unspecified   . Insomnia, unspecified   . Long term (current) use of anticoagulants   . Lumbago   . Lumbago   . Muscle weakness (generalized)   . Myalgia and myositis, unspecified   . Obesity, unspecified   . Optic neuritis   . Other abnormal blood chemistry   . Other and unspecified hyperlipidemia   . Other cataract   . Pain in joint, site unspecified   . Pain in limb   . Pain in limb   . Palpitations   . Phlebitis and thrombophlebitis of lower extremities, unspecified   . Phlebitis and thrombophlebitis of other deep vessels of lower extremities   . Rash and other nonspecific skin eruption   . Spasm of  muscle   . Syncope and collapse   . Tobacco use disorder   . Unspecified essential hypertension   . Unspecified hypothyroidism   . Unspecified sinusitis (chronic)   . Unspecified tinnitus     Patient Active Problem List   Diagnosis Date Noted  . Osteopenia 11/01/2019  . Herpes simplex 10/06/2019  . Hypersomnia 10/06/2019  . Hyperlipidemia 10/06/2019  . Obesity 10/06/2019  . Palpitations 10/06/2019  . Phlebitis 10/06/2019  . Sinusitis 10/06/2019  . Syncope 10/06/2019  . Tinnitus 10/06/2019  . Vitamin D deficiency 09/08/2019  . Varicose veins of both legs with edema 08/31/2019  . Right arm pain 06/21/2018  . Bilateral leg pain 02/17/2018  . Anxiety 12/31/2017  . Cramp of toe 07/01/2016  . Lichen sclerosus of female genitalia 05/14/2016  . Low back pain 06/04/2015  . Chronic pain 05/01/2015  . Tobacco abuse 06/04/2014  . Asthma with bronchitis 06/04/2014  . H/O cardiovascular disorder 03/17/2014  . Anterior optic neuritis 03/17/2014  . Seasonal and perennial allergic rhinitis 03/06/2014  . H/O thrombosis 12/24/2013  . Personal history of venous thrombosis and embolism 12/24/2013  . Myalgia and myositis 08/23/2013  . Chronic cough 08/23/2013  . Non-toxic uninodular goiter 03/07/2013  . Allergy 11/16/2012  .  Hypothyroidism 08/31/2012  . Deep venous thrombosis ([place]) 04/27/2002    Past Surgical History:  Procedure Laterality Date  . CATARACT EXTRACTION, BILATERAL    . EYE SURGERY     zag laser procedure.  [facility] [place] IMPLANT  2008   DR DICKERSON  . HIP SURGERY LEFT  03/2010   DR [facility]  . HIP SURGERY RIGHT  06/2010   DR [facility]  . LEFT SHOULDER  2012   DR [facility]   . LOWER BACK  05/26/2007   DR JEFF BEANE  . RIGHT BREAST BIOPSY  1986  . RIGHT KNEE  1971     OB History   No obstetric history on file.     Family History  Problem Relation Age of Onset  . Stroke Mother   . Stroke Father   . Heart disease Father   . Stroke  Brother     Social History   Tobacco Use  . Smoking status: Heavy Tobacco Smoker    Packs/day: 1.50    Years: 52.00    Pack years: 78.00    Types: Cigarettes  . Smokeless tobacco: Never Used  . Tobacco comment: 2 packs daily-01/24/2020  Vaping Use  . Vaping Use: Never used  Substance Use Topics  . Alcohol use: Yes  . Drug use: No    Home Medications Prior to Admission medications   Medication Sig Start Date End Date Taking? Authorizing Provider  diclofenac Sodium (VOLTAREN) 1 % GEL Apply 2 g topically 4 (four) times daily. 07/20/20  Yes Pati Gallo S, PA  aspirin 325 MG tablet Take 325 mg by mouth daily.     [provider]  Cholecalciferol (VITAMIN D3 PO) Take 1 tablet by mouth daily.    [provider]  EPINEPHRINE 0.3 mg/0.3 mL IJ SOAJ injection INJECT 0.3 MLS (0.3 MG TOTAL) INTO THE MUSCLE AS NEEDED FOR ANAPHYLAXIS. 06/19/20   Luetta Nutting, DO  Polyethyl Glycol-Propyl Glycol (SYSTANE PRESERVATIVE FREE OP) Apply to eye.    [provider]  TIROSINT 75 MCG CAPS Take 1 capsule (75 mcg total) by mouth daily. 10/18/19   Cirigliano, Garvin Fila, DO  triamcinolone cream (KENALOG) 0.1 % Apply topically 2 (two) times daily. 01/10/20   [provider]  triamcinolone ointment (KENALOG) 0.1 % triamcinolone acetonide 0.1 % topical ointment  APPLY A THIN LAYER TO THE AFFECTED vulvar AREA(S) BY TOPICAL ROUTE daily    [provider]    Allergies    Cortizone-10 [hydrocortisone], Other, Shellfish allergy, Synthroid [levothyroxine sodium], Azithromycin, Betadine [povidone iodine], Calcium channel blockers, Codeine, Duloxetine, Fluoride preparations, Iodine, Levaquin [levofloxacin in d5w], Lyrica [pregabalin], Novocain [procaine], Peanuts [peanut oil], Penicillins, Pollen extract, Prozac [fluoxetine hcl], Wellbutrin [bupropion], Lanolin, Molds & smuts, and Mushroom extract complex  Review of Systems   Review of Systems  Constitutional: Negative for  fever.  HENT: Negative for congestion.   Respiratory: Negative for shortness of breath.   Cardiovascular: Negative for chest pain.  Gastrointestinal: Negative for abdominal distention.  Musculoskeletal: Positive for myalgias.  Neurological: Negative for dizziness and headaches.    Physical Exam Updated Vital Signs BP (!) 145/79   Pulse 80   Temp 98.5 F (36.9 C) (Oral)   Resp 17   SpO2 94%   Physical Exam Vitals and nursing note reviewed.  Constitutional:      General: She is not in acute distress. HENT:     Head: Normocephalic and atraumatic.     Nose: Nose normal.  Eyes:  General: No scleral icterus. Cardiovascular:     Rate and Rhythm: Normal rate and regular rhythm.     Pulses: Normal pulses.     Heart sounds: Normal heart sounds.  Pulmonary:     Effort: Pulmonary effort is normal. No respiratory distress.     Breath sounds: No wheezing.  Abdominal:     Palpations: Abdomen is soft.     Tenderness: There is no abdominal tenderness.  Musculoskeletal:     Cervical back: Normal range of motion.     Right lower leg: No edema.     Left lower leg: No edema.     Comments: No bony tenderness over joints or long bones of the upper and lower extremities.     No neck or back midline tenderness, step-off, deformity, or bruising. Able to turn head left and right 45 degrees without difficulty.  Full range of motion of upper and lower extremity joints shown after palpation was conducted; with 5/5 symmetrical strength in upper and lower extremities. No chest wall tenderness, no facial or cranial tenderness.   Patient has intact sensation grossly in lower and upper extremities. Intact patellar and ankle reflexes. Patient able to ambulate without difficulty.  Radial and DP pulses palpated BL.    Skin:    General: Skin is warm and dry.     Capillary Refill: Capillary refill takes less than 2 seconds.  Neurological:     Mental Status: She is alert. Mental status is at  baseline.  Psychiatric:        Mood and Affect: Mood normal.        Behavior: Behavior normal.     ED Results / Procedures / Treatments   Labs (all labs ordered are listed, but only abnormal results are displayed) Labs Reviewed - No data to display  EKG None  Radiology No results found.  Procedures Procedures   Medications Ordered in ED Medications - No data to display  ED Course  I have reviewed the triage vital signs and the nursing notes.  Pertinent labs & imaging results that were available during my care of the patient were reviewed by me and considered in my medical decision making (see chart for details).    MDM Rules/Calculators/A&P                           Patient is 70 year old female who return to ER after eloping from evaluation 1 week ago.  She is complaining primarily of muscle aches but states that she would not of come back except for that she felt bad about eloping.  Physical exam is unremarkable   I reviewed x-rays from patient's last visit.  Apart from small lucency on the right wrist which is without any tenderness and appears to be somewhat chronic appearing on my physical exam she does not have any other abnormalities.  This area has a small circular area that is hard but nontender to touch on palpation.  Patient denies any pain apart from some aches and pains that seem to come and go worse with movement.  She denies any nausea or vomiting.  It is been a full week since her fall.  She has had no other injuries.  Will recommend Tylenol and prescribed Voltaren gel.  Follow-up with PCP.  All questions answered best my ability.  Final Clinical Impression(s) / ED Diagnoses Final diagnoses:  Fall, initial encounter  Muscle pain    Rx / DC Orders ED  Discharge Orders         Ordered    diclofenac Sodium (VOLTAREN) 1 % GEL  4 times daily        07/20/20 1455           Pati Gallo [place], [place] 07/21/20 1634    Fredia Sorrow, MD 07/29/20  [phone]

## 2020-07-20 NOTE — Discharge Instructions (Addendum)
Your examination today is most concerning for a muscular injury Please follow-up with your primary care doctor.  Please take Tylenol for pain.  Drink plenty of water.  Use warm compresses the neck such as we discussed.  If you are able to tolerate a warm shower you may direct the warm water into the back of your neck.  Gentle stretching and massage can be helpful as well.  Please also use the Voltaren gel I have prescribed you.  1. Medications: alternate ibuprofen and tylenol for pain control, take all usual home medications as they are prescribed 2. Treatment: rest, ice, elevate and use an ACE wrap or other compressive therapy to decrease swelling. Also drink plenty of fluids and do plenty of gentle stretching and move the affected muscle through its normal range of motion to prevent stiffness. 3. Follow Up: If your symptoms do not improve please follow up with orthopedics/sports medicine or your PCP for discussion of your diagnoses and further evaluation after today's visit; if you do not have a primary care doctor use the resource guide provided to find one; Please return to the ER for worsening symptoms or other concerns.

## 2020-08-22 ENCOUNTER — Encounter: Payer: Self-pay | Admitting: Family Medicine

## 2020-08-22 ENCOUNTER — Ambulatory Visit (INDEPENDENT_AMBULATORY_CARE_PROVIDER_SITE_OTHER): Payer: Medicare HMO | Admitting: Family Medicine

## 2020-08-22 ENCOUNTER — Other Ambulatory Visit: Payer: Self-pay

## 2020-08-22 VITALS — BP 122/78 | HR 83 | Temp 98.3°F | Ht 67.75 in | Wt 179.6 lb

## 2020-08-22 DIAGNOSIS — W19XXXD Unspecified fall, subsequent encounter: Secondary | ICD-10-CM

## 2020-08-22 DIAGNOSIS — R69 Illness, unspecified: Secondary | ICD-10-CM | POA: Diagnosis not present

## 2020-08-22 DIAGNOSIS — M25512 Pain in left shoulder: Secondary | ICD-10-CM

## 2020-08-22 DIAGNOSIS — R053 Chronic cough: Secondary | ICD-10-CM | POA: Diagnosis not present

## 2020-08-22 DIAGNOSIS — F17210 Nicotine dependence, cigarettes, uncomplicated: Secondary | ICD-10-CM

## 2020-08-22 DIAGNOSIS — M542 Cervicalgia: Secondary | ICD-10-CM | POA: Diagnosis not present

## 2020-08-22 MED ORDER — CYCLOBENZAPRINE HCL 5 MG PO TABS
5.0000 mg | ORAL_TABLET | Freq: Three times a day (TID) | ORAL | 1 refills | Status: DC | PRN
Start: 2020-08-22 — End: 2021-08-19

## 2020-08-22 NOTE — Patient Instructions (Addendum)
Shoulder Exercises Ask your health care provider which exercises are safe for you. Do exercises exactly as told by your health care provider and adjust them as directed. It is normal to feel mild stretching, pulling, tightness, or discomfort as you do these exercises. Stop right away if you feel sudden pain or your pain gets worse. Do not begin these exercises until told by your health care provider. Stretching exercises External rotation and abduction This exercise is sometimes called corner stretch. This exercise rotates your arm outward (external rotation) and moves your arm out from your body (abduction). 1. Stand in a doorway with one of your feet slightly in front of the other. This is called a staggered stance. If you cannot reach your forearms to the door frame, stand facing a corner of a room. 2. Choose one of the following positions as told by your health care provider: ? Place your hands and forearms on the door frame above your head. ? Place your hands and forearms on the door frame at the height of your head. ? Place your hands on the door frame at the height of your elbows. 3. Slowly move your weight onto your front foot until you feel a stretch across your chest and in the front of your shoulders. Keep your head and chest upright and keep your abdominal muscles tight. 4. Hold for __________ seconds. 5. To release the stretch, shift your weight to your back foot. Repeat __________ times. Complete this exercise __________ times a day.   Extension, standing 1. Stand and hold a broomstick, a cane, or a similar object behind your back. ? Your hands should be a little wider than shoulder width apart. ? Your palms should face away from your back. 2. Keeping your elbows straight and your shoulder muscles relaxed, move the stick away from your body until you feel a stretch in your shoulders (extension). ? Avoid shrugging your shoulders while you move the stick. Keep your shoulder blades  tucked down toward the middle of your back. 3. Hold for __________ seconds. 4. Slowly return to the starting position. Repeat __________ times. Complete this exercise __________ times a day. Range-of-motion exercises Pendulum 1. Stand near a wall or a surface that you can hold onto for balance. 2. Bend at the waist and let your left / right arm hang straight down. Use your other arm to support you. Keep your back straight and do not lock your knees. 3. Relax your left / right arm and shoulder muscles, and move your hips and your trunk so your left / right arm swings freely. Your arm should swing because of the motion of your body, not because you are using your arm or shoulder muscles. 4. Keep moving your hips and trunk so your arm swings in the following directions, as told by your health care provider: ? Side to side. ? Forward and backward. ? In clockwise and counterclockwise circles. 5. Continue each motion for __________ seconds, or for as long as told by your health care provider. 6. Slowly return to the starting position. Repeat __________ times. Complete this exercise __________ times a day.   Shoulder flexion, standing 1. Stand and hold a broomstick, a cane, or a similar object. Place your hands a little more than shoulder width apart on the object. Your left / right hand should be palm up, and your other hand should be palm down. 2. Keep your elbow straight and your shoulder muscles relaxed. Push the stick up with your healthy   arm to raise your left / right arm in front of your body, and then over your head until you feel a stretch in your shoulder (flexion). ? Avoid shrugging your shoulder while you raise your arm. Keep your shoulder blade tucked down toward the middle of your back. 3. Hold for __________ seconds. 4. Slowly return to the starting position. Repeat __________ times. Complete this exercise __________ times a day.   Shoulder abduction, standing 1. Stand and hold a  broomstick, a cane, or a similar object. Place your hands a little more than shoulder width apart on the object. Your left / right hand should be palm up, and your other hand should be palm down. 2. Keep your elbow straight and your shoulder muscles relaxed. Push the object across your body toward your left / right side. Raise your left / right arm to the side of your body (abduction) until you feel a stretch in your shoulder. ? Do not raise your arm above shoulder height unless your health care provider tells you to do that. ? If directed, raise your arm over your head. ? Avoid shrugging your shoulder while you raise your arm. Keep your shoulder blade tucked down toward the middle of your back. 3. Hold for __________ seconds. 4. Slowly return to the starting position. Repeat __________ times. Complete this exercise __________ times a day. Internal rotation 1. Place your left / right hand behind your back, palm up. 2. Use your other hand to dangle an exercise band, a towel, or a similar object over your shoulder. Grasp the band with your left / right hand so you are holding on to both ends. 3. Gently pull up on the band until you feel a stretch in the front of your left / right shoulder. The movement of your arm toward the center of your body is called internal rotation. ? Avoid shrugging your shoulder while you raise your arm. Keep your shoulder blade tucked down toward the middle of your back. 4. Hold for __________ seconds. 5. Release the stretch by letting go of the band and lowering your hands. Repeat __________ times. Complete this exercise __________ times a day.   Strengthening exercises External rotation 1. Sit in a stable chair without armrests. 2. Secure an exercise band to a stable object at elbow height on your left / right side. 3. Place a soft object, such as a folded towel or a small pillow, between your left / right upper arm and your body to move your elbow about 4 inches (10  cm) away from your side. 4. Hold the end of the exercise band so it is tight and there is no slack. 5. Keeping your elbow pressed against the soft object, slowly move your forearm out, away from your abdomen (external rotation). Keep your body steady so only your forearm moves. 6. Hold for __________ seconds. 7. Slowly return to the starting position. Repeat __________ times. Complete this exercise __________ times a day.   Shoulder abduction 1. Sit in a stable chair without armrests, or stand up. 2. Hold a __________ weight in your left / right hand, or hold an exercise band with both hands. 3. Start with your arms straight down and your left / right palm facing in, toward your body. 4. Slowly lift your left / right hand out to your side (abduction). Do not lift your hand above shoulder height unless your health care provider tells you that this is safe. ? Keep your arms straight. ? Avoid   shrugging your shoulder while you do this movement. Keep your shoulder blade tucked down toward the middle of your back. 5. Hold for __________ seconds. 6. Slowly lower your arm, and return to the starting position. Repeat __________ times. Complete this exercise __________ times a day.   Shoulder extension 1. Sit in a stable chair without armrests, or stand up. 2. Secure an exercise band to a stable object in front of you so it is at shoulder height. 3. Hold one end of the exercise band in each hand. Your palms should face each other. 4. Straighten your elbows and lift your hands up to shoulder height. 5. Step back, away from the secured end of the exercise band, until the band is tight and there is no slack. 6. Squeeze your shoulder blades together as you pull your hands down to the sides of your thighs (extension). Stop when your hands are straight down by your sides. Do not let your hands go behind your body. 7. Hold for __________ seconds. 8. Slowly return to the starting position. Repeat __________  times. Complete this exercise __________ times a day. Shoulder row 1. Sit in a stable chair without armrests, or stand up. 2. Secure an exercise band to a stable object in front of you so it is at waist height. 3. Hold one end of the exercise band in each hand. Position your palms so that your thumbs are facing the ceiling (neutral position). 4. Bend each of your elbows to a 90-degree angle (right angle) and keep your upper arms at your sides. 5. Step back until the band is tight and there is no slack. 6. Slowly pull your elbows back behind you. 7. Hold for __________ seconds. 8. Slowly return to the starting position. Repeat __________ times. Complete this exercise __________ times a day. Shoulder press-ups 1. Sit in a stable chair that has armrests. Sit upright, with your feet flat on the floor. 2. Put your hands on the armrests so your elbows are bent and your fingers are pointing forward. Your hands should be about even with the sides of your body. 3. Push down on the armrests and use your arms to lift yourself off the chair. Straighten your elbows and lift yourself up as much as you comfortably can. ? Move your shoulder blades down, and avoid letting your shoulders move up toward your ears. ? Keep your feet on the ground. As you get stronger, your feet should support less of your body weight as you lift yourself up. 4. Hold for __________ seconds. 5. Slowly lower yourself back into the chair. Repeat __________ times. Complete this exercise __________ times a day.   Wall push-ups 1. Stand so you are facing a stable wall. Your feet should be about one arm-length away from the wall. 2. Lean forward and place your palms on the wall at shoulder height. 3. Keep your feet flat on the floor as you bend your elbows and lean forward toward the wall. 4. Hold for __________ seconds. 5. Straighten your elbows to push yourself back to the starting position. Repeat __________ times. Complete this  exercise __________ times a day.   This information is not intended to replace advice given to you by your health care provider. Make sure you discuss any questions you have with your health care provider. Document Revised: 08/05/2018 Document Reviewed: 05/13/2018 Elsevier Patient Education  2021 [employer] Inc.  

## 2020-08-22 NOTE — Progress Notes (Signed)
[name] is a 70 y.o. female  Chief Complaint  Patient presents with  . Hospitalization Follow-up    HFU-neck, head, shoulder and jrt side aw joint pain when chewing, pt said that she has pain when she lay down on her pillow.    HPI: [name] is a 70 y.o. female patient seen for f/u after ER visit on 07/20/20. Pt was in the ER on 07/14/20, had imaging done but left prior to being seen by the ER attending. Pt tripped and fell on uneven concrete on 07/13/20. She hit her face and landed on outstretched arms, and slid on her LLE. Xrays of Lt foot, pelvis, B/L wrists negative for fracture. ER records reviewed personally by me today.  Pt has a h/o B/L hip replacements. Today pt complains of persistent neck and Lt shoulder pain.  She states it hurts to lay on her side. No numbness or tingling. No weakness.  She uses Blue Emu cream but no tylenol or NSAID. She has not been doing ice/heat. She has been doing home exercises. She is hesitant about taking any medication without readings all of the side effects. She also will not take any med produced by [employer] as she feels that company "killed [my] husband"  Pt also complains of 1 mo h/o cough. States she was dx with bronchitis and cough improved but then worsened. No SOB, DOE. No fever, chills. No wheeze. She took mucinex for a week or two. She cannot tolerate claritin and thinks zyrtec "did nothing" in the past.  Pt is a smoker 1-1.5ppd x 50+ years Screening lung CT in 12/2019 - Rt lung with non-solid nodule 5.45mm and solid nodules 3.34mm. repeat in 12 mo. + emphysema  Past Medical History:  Diagnosis Date  . Abdominal pain, epigastric   . Abdominal pain, epigastric   . Abdominal pain, right lower quadrant   . Abnormal weight gain   . Abnormality of gait   . Anxiety state, unspecified   . Asymptomatic varicose veins   . Blood vessel replaced by other means   . Cramp of limb   . Disorder of bone and cartilage, unspecified   .  Dizziness and giddiness   . Edema   . Encounter for long-term (current) use of other medications   . Herpes zoster without mention of complication   . Herpes zoster without mention of complication   . Hypersomnia, unspecified   . Insomnia, unspecified   . Long term (current) use of anticoagulants   . Lumbago   . Lumbago   . Muscle weakness (generalized)   . Myalgia and myositis, unspecified   . Obesity, unspecified   . Optic neuritis   . Other abnormal blood chemistry   . Other and unspecified hyperlipidemia   . Other cataract   . Pain in joint, site unspecified   . Pain in limb   . Pain in limb   . Palpitations   . Phlebitis and thrombophlebitis of lower extremities, unspecified   . Phlebitis and thrombophlebitis of other deep vessels of lower extremities   . Rash and other nonspecific skin eruption   . Spasm of muscle   . Syncope and collapse   . Tobacco use disorder   . Unspecified essential hypertension   . Unspecified hypothyroidism   . Unspecified sinusitis (chronic)   . Unspecified tinnitus     Past Surgical History:  Procedure Laterality Date  . CATARACT EXTRACTION, BILATERAL    . EYE SURGERY  zag laser procedure.  Marland Kitchen FINGER SURGERY Left 06/18/2020  . [place] IMPLANT  2008   DR DICKERSON  . HIP SURGERY LEFT  03/2010   [facility]  . HIP SURGERY RIGHT  06/2010   [facility]  . LEFT SHOULDER  2012   DR [facility]   . LOWER BACK  05/26/2007   DR JEFF BEANE  . RIGHT BREAST BIOPSY  1986  . RIGHT KNEE  1971    Social History   Socioeconomic History  . Marital status: Widowed    Spouse name: [name]  . Number of children: 0  . Years of education: college  . Highest education level: Not on file  Occupational History    Employer: [employer]    Comment: answers phones  Tobacco Use  . Smoking status: Heavy Tobacco Smoker    Packs/day: 1.50    Years: 52.00    Pack years: 78.00    Types: Cigarettes  . Smokeless tobacco:  Never Used  . Tobacco comment: 2 packs daily-01/24/2020  Vaping Use  . Vaping Use: Never used  Substance and Sexual Activity  . Alcohol use: Yes  . Drug use: No  . Sexual activity: Not Currently    Partners: Male    Comment: husband  Other Topics Concern  . Not on file  Social History Narrative   Patient  Lives at home wit her husband [name]) . Patient still works.college education.   Right handed.   Caffiene -None   Social Determinants of Health   Financial Resource Strain: Not on file  Food Insecurity: Not on file  Transportation Needs: Not on file  Physical Activity: Not on file  Stress: Not on file  Social Connections: Not on file  Intimate Partner Violence: Not on file    Family History  Problem Relation Age of Onset  . Stroke Mother   . Stroke Father   . Heart disease Father   . Stroke Brother      Immunization History  Administered Date(s) Administered  . Fluad Quad(high Dose 65+) 01/24/2019  . Influenza Split 01/24/2014  . Influenza, High Dose Seasonal PF 01/27/2018  . Influenza,inj,Quad PF,6+ Mos 12/31/2015  . Influenza-Unspecified 01/24/2013, 01/02/2014, 02/18/2015, 12/26/2016  . Pneumococcal Conjugate-13 12/31/2015  . Pneumococcal Polysaccharide-23 04/27/2012  . Rabies, IM 05/02/2018, 05/05/2018, 05/09/2018, 05/16/2018  . Td 05/25/1988  . Tdap 02/03/2014  . Zoster 04/28/2010  . Zoster Recombinat (Shingrix) 01/06/2018, 04/11/2018    Outpatient Encounter Medications as of 08/22/2020  Medication Sig  . aspirin 325 MG tablet Take 325 mg by mouth daily.  . Cholecalciferol (VITAMIN D3 PO) Take 1 tablet by mouth daily.  . cyclobenzaprine (FLEXERIL) 5 MG tablet Take 1 tablet (5 mg total) by mouth 3 (three) times daily as needed for muscle spasms.  Marland Kitchen EPINEPHRINE 0.3 mg/0.3 mL IJ SOAJ injection INJECT 0.3 MLS (0.3 MG TOTAL) INTO THE MUSCLE AS NEEDED FOR ANAPHYLAXIS.  Marland Kitchen TIROSINT 75 MCG CAPS Take 1 capsule (75 mcg total) by mouth daily.  Marland Kitchen triamcinolone  cream (KENALOG) 0.1 % Apply topically 2 (two) times daily.  Marland Kitchen triamcinolone ointment (KENALOG) 0.1 % triamcinolone acetonide 0.1 % topical ointment  APPLY A THIN LAYER TO THE AFFECTED vulvar AREA(S) BY TOPICAL ROUTE daily  . Polyethyl Glycol-Propyl Glycol (SYSTANE PRESERVATIVE FREE OP) Apply to eye.  . [DISCONTINUED] diclofenac Sodium (VOLTAREN) 1 % GEL Apply 2 g topically 4 (four) times daily. (Patient not taking: Reported on 08/22/2020)   No facility-administered encounter medications on file as  of 08/22/2020.     ROS: Pertinent positives and negatives noted in HPI. Remainder of ROS non-contributory    Allergies  Allergen Reactions  . Cortizone-10 [Hydrocortisone] Hives  . Other Anaphylaxis    Beans,carbonation, dust and mold,floride,fried foods, household cleaner, melon,mushrooms,peppers,preservatives,rasisns,spicey,spinach,trees,milk products,shellfish  . Shellfish Allergy Other (See Comments) and Anaphylaxis    Death  . Synthroid [Levothyroxine Sodium] Anaphylaxis    Internal shaking   . Azithromycin Hives  . Betadine [Povidone Iodine]     Aspiration   . Calcium Channel Blockers     Asthma  . Codeine     Flu-like symptoms   . Duloxetine Itching and Nausea Only    Dry mouth  . Fluoride Preparations Other (See Comments)    Blisters  . Iodine Other (See Comments)    Aspiration   . Levaquin [Levofloxacin In D5w]   . Lyrica [Pregabalin] Other (See Comments)    Make her very mean and angery  . Novocain [Procaine] Other (See Comments)    Heart Palpations  . Peanuts [Peanut Oil] Swelling    **Allergy to ALL NUTS** Face Swelling   . Penicillins   . Pollen Extract Other (See Comments)    Hemorrhage   . Prozac [Fluoxetine Hcl]   . Wellbutrin [Bupropion] Hives    After 1 pill fist size bumps appeared  . Lanolin Rash  . Molds & Smuts Other (See Comments)  . Mushroom Extract Complex Nausea And Vomiting    BP 122/78 (BP Location: Left Arm, Patient Position: Sitting, Cuff  Size: Normal)   Pulse 83   Temp 98.3 F (36.8 C) (Oral)   Ht 5' 7.75" (1.721 m)   Wt 179 lb 9.6 oz (81.5 kg)   SpO2 96%   BMI 27.51 kg/m   Wt Readings from Last 3 Encounters:  08/22/20 179 lb 9.6 oz (81.5 kg)  01/24/20 181 lb 6.4 oz (82.3 kg)  11/29/19 183 lb (83 kg)   Temp Readings from Last 3 Encounters:  08/22/20 98.3 F (36.8 C) (Oral)  07/20/20 98.5 F (36.9 C) (Oral)  07/14/20 98.2 F (36.8 C)   BP Readings from Last 3 Encounters:  08/22/20 122/78  07/20/20 (!) 145/79  07/14/20 (!) 154/89   Pulse Readings from Last 3 Encounters:  08/22/20 83  07/20/20 80  07/14/20 90     Physical Exam Constitutional:      General: She is not in acute distress.    Appearance: She is not ill-appearing.  Pulmonary:     Effort: Pulmonary effort is normal. No accessory muscle usage, respiratory distress or retractions.     Breath sounds: Normal breath sounds. Transmitted upper airway sounds present. No decreased air movement. No wheezing or rhonchi.     Comments: + upper airway congestion, clear with coughing Musculoskeletal:     Left shoulder: Tenderness (anterior shoulder TTP) present. No effusion, bony tenderness or crepitus. Normal range of motion. Normal strength. Normal pulse.     Cervical back: No crepitus. Pain with movement and muscular tenderness present. No spinous process tenderness. Decreased range of motion.  Lymphadenopathy:     Cervical:     Right cervical: No superficial or posterior cervical adenopathy.    Left cervical: No superficial or posterior cervical adenopathy.  Neurological:     Mental Status: She is alert and oriented to person, place, and time.  Psychiatric:        Behavior: Behavior normal.      A/P:  1. Cause of injury, accidental fall, subsequent encounter 2.  Neck pain, musculoskeletal 3. Acute pain of left shoulder - Ambulatory referral to Physical Therapy - DG Shoulder Left; Future - DG Cervical Spine Complete; Future Rx: -  cyclobenzaprine (FLEXERIL) 5 MG tablet; Take 1 tablet (5 mg total) by mouth 3 (three) times daily as needed for muscle spasms.  Dispense: 30 tablet; Refill: 1 - pt will read potential side effects and decide if she will take at bedtime as needed - pt declines NSAIDs due to taking ASA 325mg  daily  4. Chronic cough 5. Cigarette nicotine dependence without complication - stable screening lung CT in 12/2019 - recommend mucinex DM BID x 7-10 days - consider trial of corticosteroid inhaler if no improvement in 2 wks - discussed benefits of smoking cessation    This visit occurred during the SARS-CoV-2 public health emergency.  Safety protocols were in place, including screening questions prior to the visit, additional usage of staff PPE, and extensive cleaning of exam room while observing appropriate contact time as indicated for disinfecting solutions.

## 2020-08-26 ENCOUNTER — Other Ambulatory Visit: Payer: Medicare HMO

## 2020-08-27 ENCOUNTER — Telehealth: Payer: Self-pay

## 2020-08-27 NOTE — Telephone Encounter (Signed)
Pt wanted to ask Dr. Loletha Grayer how long inbetween should she wait if she takes 2 tablets verses just taking one before bedtime.  Pt was told to take 1 at bedtime and she said it did work fore a couple of days, but she now has waken up after 2 hours of good sleep.  Please advise.

## 2020-08-28 NOTE — Telephone Encounter (Signed)
Pt informed of message below.

## 2020-08-28 NOTE — Telephone Encounter (Signed)
Is patient referring to the flexeril (muscle relaxant)? It is not Rx'd for sleep but to help with her neck pain secondary to muscle spasm. She should take 1 tab at bedtime x 4-5 nights then as needed. I would also recommend trial of ibuprofen 600mg  (3 200mg  tabs) 2x/day with food x 5-7 days. If no improvement, call or send mychart message

## 2020-08-29 ENCOUNTER — Other Ambulatory Visit: Payer: Medicare HMO

## 2020-09-02 ENCOUNTER — Other Ambulatory Visit: Payer: Medicare HMO

## 2020-09-03 ENCOUNTER — Ambulatory Visit (INDEPENDENT_AMBULATORY_CARE_PROVIDER_SITE_OTHER): Payer: Medicare HMO

## 2020-09-03 ENCOUNTER — Other Ambulatory Visit: Payer: Medicare HMO

## 2020-09-03 ENCOUNTER — Other Ambulatory Visit: Payer: Self-pay

## 2020-09-03 DIAGNOSIS — M542 Cervicalgia: Secondary | ICD-10-CM

## 2020-09-03 DIAGNOSIS — M25512 Pain in left shoulder: Secondary | ICD-10-CM

## 2020-09-03 DIAGNOSIS — W19XXXD Unspecified fall, subsequent encounter: Secondary | ICD-10-CM

## 2020-09-04 ENCOUNTER — Ambulatory Visit: Payer: Medicare HMO | Attending: Family Medicine | Admitting: Rehabilitative and Restorative Service Providers"

## 2020-09-04 ENCOUNTER — Encounter: Payer: Self-pay | Admitting: Rehabilitative and Restorative Service Providers"

## 2020-09-04 DIAGNOSIS — R600 Localized edema: Secondary | ICD-10-CM | POA: Diagnosis not present

## 2020-09-04 DIAGNOSIS — M542 Cervicalgia: Secondary | ICD-10-CM | POA: Insufficient documentation

## 2020-09-04 DIAGNOSIS — M25512 Pain in left shoulder: Secondary | ICD-10-CM | POA: Insufficient documentation

## 2020-09-04 DIAGNOSIS — M6281 Muscle weakness (generalized): Secondary | ICD-10-CM

## 2020-09-04 DIAGNOSIS — G8929 Other chronic pain: Secondary | ICD-10-CM | POA: Diagnosis not present

## 2020-09-04 NOTE — Patient Instructions (Signed)
Access Code: [email or web address] URL: [email or web address] Date: 09/04/2020 Prepared by: Shelby Dubin Deborah Dondero  Exercises Seated Cervical Retraction - 1 x daily - 7 x weekly - 2 sets - 10 reps Seated Cervical Flexion AROM - 1 x daily - 7 x weekly - 2 sets - 10 reps Seated Cervical Extension AROM - 1 x daily - 7 x weekly - 2 sets - 10 reps Seated Cervical Rotation AROM - 1 x daily - 7 x weekly - 2 sets - 10 reps

## 2020-09-04 NOTE — Therapy (Signed)
[place] [place], [place], [address] Phone: [phone]   Fax:  [phone]  Physical Therapy Evaluation  Patient Details  Name: [name] MRN: [id number] Date of Birth: [birth year] Referring Provider (PT): Dr Nolon Lennert   Encounter Date: 09/04/2020   PT End of Session - 09/04/20 1321    Visit Number 1    Date for PT Re-Evaluation 11/22/20    Authorization Type [employer] Medicare    PT Start Time 5465    PT Stop Time 1315    PT Time Calculation (min) 40 min    Activity Tolerance Patient tolerated treatment well    Behavior During Therapy [facility] for tasks assessed/performed           Past Medical History:  Diagnosis Date  . Abdominal pain, epigastric   . Abdominal pain, epigastric   . Abdominal pain, right lower quadrant   . Abnormal weight gain   . Abnormality of gait   . Anxiety state, unspecified   . Asymptomatic varicose veins   . Blood vessel replaced by other means   . Cramp of limb   . Disorder of bone and cartilage, unspecified   . Dizziness and giddiness   . Edema   . Encounter for long-term (current) use of other medications   . Herpes zoster without mention of complication   . Herpes zoster without mention of complication   . Hypersomnia, unspecified   . Insomnia, unspecified   . Long term (current) use of anticoagulants   . Lumbago   . Lumbago   . Muscle weakness (generalized)   . Myalgia and myositis, unspecified   . Obesity, unspecified   . Optic neuritis   . Other abnormal blood chemistry   . Other and unspecified hyperlipidemia   . Other cataract   . Pain in joint, site unspecified   . Pain in limb   . Pain in limb   . Palpitations   . Phlebitis and thrombophlebitis of lower extremities, unspecified   . Phlebitis and thrombophlebitis of other deep vessels of lower extremities   . Rash and other nonspecific skin eruption   . Spasm of muscle   . Syncope and collapse    . Tobacco use disorder   . Unspecified essential hypertension   . Unspecified hypothyroidism   . Unspecified sinusitis (chronic)   . Unspecified tinnitus     Past Surgical History:  Procedure Laterality Date  . CATARACT EXTRACTION, BILATERAL    . EYE SURGERY     zag laser procedure.  Marland Kitchen FINGER SURGERY Left 06/18/2020  . [place] IMPLANT  2008   DR DICKERSON  . HIP SURGERY LEFT  03/2010   DR [facility]  . HIP SURGERY RIGHT  06/2010   DR [facility]  . LEFT SHOULDER  2012   DR [facility]   . LOWER BACK  05/26/2007   DR JEFF BEANE  . RIGHT BREAST BIOPSY  1986  . RIGHT KNEE  1971    There were no vitals filed for this visit.    Subjective Assessment - 09/04/20 1244    Subjective Patient states that in March of this year, [place] and her hair was blowing in the wind and she did not notice a small step in the concrete and she fell and injured her neck and shoulders.  She has been having increased pain since and her MD referred her to PT for treatment.  Pertinent History B THA    Limitations Lifting;House hold activities    Patient Stated Goals I want to avoid surgery and get rid of this whiplash.    Currently in Pain? Yes    Pain Score 7     Pain Location Shoulder    Pain Orientation Left    Pain Descriptors / Indicators Sharp;Shooting    Pain Type Acute pain    Pain Onset More than a month ago    Pain Frequency Intermittent    Multiple Pain Sites Yes    Pain Score 7    Pain Location Neck    Pain Orientation Posterior;Lateral    Pain Descriptors / Indicators Radiating;Aching    Pain Type Acute pain    Pain Radiating Towards shoulders    Pain Onset More than a month ago    Pain Frequency Constant              [facility] PT Assessment - 09/04/20 0001      Assessment   Referring Provider (PT) Dr Nolon Lennert    Onset Date/Surgical Date 07/13/20    Hand Dominance Right    Prior Therapy yes      Precautions   Precautions  None      Restrictions   Weight Bearing Restrictions No      Balance Screen   Has the patient fallen in the past 6 months Yes    How many times? 1    Has the patient had a decrease in activity level because of a fear of falling?  No    Is the patient reluctant to leave their home because of a fear of falling?  No      Home Environment   Living Environment Private residence    Living Arrangements Alone    Type of [place] to enter    Entrance Stairs-Number of Steps [name] One level    [place] - 4 wheels;Bedside commode;Shower seat;Grab bars - tub/shower      Prior Function   Level of Independence Independent    Vocation Retired    Leisure walking dog, going to the [employer]    Overall Cognitive Status Within Functional Limits for tasks assessed      Observation/Other Assessments   Focus on Therapeutic Outcomes (FOTO)  44%, Risk Adjusted 46%      Posture/Postural Control   Posture/Postural Control Postural limitations    Postural Limitations Rounded Shoulders;Forward head      ROM / Strength   AROM / PROM / Strength AROM;Strength      AROM   Overall AROM Comments Cervical ROM limited at least 25% throughout, R rotation limited approximately 50%.      Strength   Overall Strength Comments L shoulder strength of 30minus/5 grossly throughout                      Objective measurements completed on examination: See above findings.       [facility] Adult PT Treatment/Exercise - 09/04/20 0001      Exercises   Exercises Neck;Shoulder      Neck Exercises: Seated   Neck Retraction 10 reps    Cervical Rotation 10 reps    Other Seated Exercise cervical flexion/extension x10 reps                  PT Education - 09/04/20 1320  Education Details Issued HEP    Person(s) Educated Patient    Methods Explanation;Demonstration;Handout    Comprehension Verbalized understanding;Returned demonstration             PT Short Term Goals - 09/04/20 1346      PT SHORT TERM GOAL #1   Title indepednent with initial HEP    Time 2    Period Weeks    Status New             PT Long Term Goals - 09/04/20 1346      PT LONG TERM GOAL #1   Title Patient will be independent with advanced HEP.    Time 12    Period Weeks    Status New      PT LONG TERM GOAL #2   Title Increase cervical ROM to [facility] to allow her to be able to drive with ease.    Time 12    Period Weeks    Status New      PT LONG TERM GOAL #3   Title report that she is able to do the housework without pain >5/10    Time 12    Period Weeks    Status New      PT LONG TERM GOAL #4   Title Increase L shoulder strength to at least 4+/5 to allow her to perform heavy house duties.    Time 12    Period Weeks    Status New                  Plan - 09/04/20 1322    Clinical Impression Statement Patient presents to outpatient PT with a referral from Dr Letta Median s/p a fall on concrete on 07/13/20 where she has experienced resulting neck and L shoulder pain.  She presents with decreased ROM, decreased strength, difficulty performing bed mobility, and other functional deficits.  She has had PT in the past following a L shoulder surgery and is hoping that with PT she can avoid further surgery.  She presents with decreased cervical ROM, especially towards left side.  Patient would benefit from skilled PT to address her funcitonal impairments to allow her to be able to return to her prior social activities.    Personal Factors and Comorbidities Age;Comorbidity 1    Examination-Activity Limitations Dressing;Bed Mobility;Lift;Reach Overhead;Carry    Examination-Participation Restrictions Community Activity;Laundry;Yard Work    [employer]chant [employer] officer Evolving/Moderate complexity    Clinical Decision Making Moderate    Rehab Potential Good    PT Frequency 2x / week    PT Duration 12 weeks    PT  Treatment/Interventions ADLs/Self Care Home Management;Electrical Stimulation;Iontophoresis 4mg /ml Dexamethasone;Cryotherapy;Moist Heat;Traction;Ultrasound;Therapeutic activities;Therapeutic exercise;Balance training;Neuromuscular re-education;Patient/family education;Manual techniques;Passive range of motion;Dry needling;Vasopneumatic Device;Spinal Manipulations;Joint Manipulations    PT Next Visit Plan assess HEP, progress strengthening and ROM    PT Home Exercise Plan Access Code: [email or web address]    Consulted and Agree with Plan of Care Patient           Patient will benefit from skilled therapeutic intervention in order to improve the following deficits and impairments:  Hypomobility,Increased muscle spasms,Decreased range of motion,Increased edema,Decreased strength,Increased fascial restricitons,Postural dysfunction,Pain  Visit Diagnosis: Cervicalgia - Plan: PT plan of care cert/re-cert  Chronic left shoulder pain - Plan: PT plan of care cert/re-cert  Muscle weakness (generalized) - Plan: PT plan of care cert/re-cert  Localized edema - Plan: PT plan of care cert/re-cert     Problem List Patient Active Problem List  Diagnosis Date Noted  . Osteopenia 11/01/2019  . Herpes simplex 10/06/2019  . Hypersomnia 10/06/2019  . Hyperlipidemia 10/06/2019  . Obesity 10/06/2019  . Palpitations 10/06/2019  . Phlebitis 10/06/2019  . Sinusitis 10/06/2019  . Syncope 10/06/2019  . Tinnitus 10/06/2019  . Vitamin D deficiency 09/08/2019  . Varicose veins of both legs with edema 08/31/2019  . Right arm pain 06/21/2018  . Bilateral leg pain 02/17/2018  . Anxiety 12/31/2017  . Cramp of toe 07/01/2016  . Lichen sclerosus of female genitalia 05/14/2016  . Low back pain 06/04/2015  . Chronic pain 05/01/2015  . Tobacco abuse 06/04/2014  . Asthma with bronchitis 06/04/2014  . H/O cardiovascular disorder 03/17/2014  . Anterior optic neuritis 03/17/2014  . Seasonal and perennial allergic rhinitis  03/06/2014  . H/O thrombosis 12/24/2013  . Personal history of venous thrombosis and embolism 12/24/2013  . Myalgia and myositis 08/23/2013  . Chronic cough 08/23/2013  . Non-toxic uninodular goiter 03/07/2013  . Allergy 11/16/2012  . Hypothyroidism 08/31/2012  . Deep venous thrombosis ([place]) 04/27/2002    Juel Burrow, PT, DPT 09/04/2020, 1:51 PM  [place]. [place], [place], [address] Phone: [phone]   Fax:  [phone]  Name: [name] MRN: [id number] Date of Birth: 04-10-1951

## 2020-09-11 ENCOUNTER — Ambulatory Visit: Payer: Medicare HMO | Admitting: Rehabilitative and Restorative Service Providers"

## 2020-09-18 ENCOUNTER — Ambulatory Visit: Payer: Medicare HMO | Admitting: Physical Therapy

## 2020-09-18 DIAGNOSIS — L570 Actinic keratosis: Secondary | ICD-10-CM | POA: Diagnosis not present

## 2020-09-25 ENCOUNTER — Other Ambulatory Visit: Payer: Self-pay

## 2020-09-25 ENCOUNTER — Telehealth: Payer: Self-pay | Admitting: Family Medicine

## 2020-09-25 ENCOUNTER — Encounter: Payer: Self-pay | Admitting: Physical Therapy

## 2020-09-25 ENCOUNTER — Ambulatory Visit: Payer: Medicare HMO | Attending: Family Medicine | Admitting: Physical Therapy

## 2020-09-25 DIAGNOSIS — M6281 Muscle weakness (generalized): Secondary | ICD-10-CM | POA: Diagnosis not present

## 2020-09-25 DIAGNOSIS — R600 Localized edema: Secondary | ICD-10-CM | POA: Diagnosis not present

## 2020-09-25 DIAGNOSIS — M25512 Pain in left shoulder: Secondary | ICD-10-CM | POA: Insufficient documentation

## 2020-09-25 DIAGNOSIS — G8929 Other chronic pain: Secondary | ICD-10-CM | POA: Diagnosis not present

## 2020-09-25 DIAGNOSIS — M542 Cervicalgia: Secondary | ICD-10-CM | POA: Insufficient documentation

## 2020-09-25 NOTE — Therapy (Signed)
[place]. [place], [place], [address] Phone: [phone]   Fax:  [phone]  Physical Therapy Treatment  Patient Details  Name: [name] MRN: [id number] Date of Birth: 04-11-1951 Referring Provider (PT): Dr Nolon Lennert   Encounter Date: 09/25/2020   PT End of Session - 09/25/20 1421    Visit Number 2    Date for PT Re-Evaluation 11/22/20    Authorization Type Aetna Medicare    PT Start Time 3729    PT Stop Time 1425    PT Time Calculation (min) 40 min    Activity Tolerance Patient tolerated treatment well    Behavior During Therapy [facility] for tasks assessed/performed           Past Medical History:  Diagnosis Date  . Abdominal pain, epigastric   . Abdominal pain, epigastric   . Abdominal pain, right lower quadrant   . Abnormal weight gain   . Abnormality of gait   . Anxiety state, unspecified   . Asymptomatic varicose veins   . Blood vessel replaced by other means   . Cramp of limb   . Disorder of bone and cartilage, unspecified   . Dizziness and giddiness   . Edema   . Encounter for long-term (current) use of other medications   . Herpes zoster without mention of complication   . Herpes zoster without mention of complication   . Hypersomnia, unspecified   . Insomnia, unspecified   . Long term (current) use of anticoagulants   . Lumbago   . Lumbago   . Muscle weakness (generalized)   . Myalgia and myositis, unspecified   . Obesity, unspecified   . Optic neuritis   . Other abnormal blood chemistry   . Other and unspecified hyperlipidemia   . Other cataract   . Pain in joint, site unspecified   . Pain in limb   . Pain in limb   . Palpitations   . Phlebitis and thrombophlebitis of lower extremities, unspecified   . Phlebitis and thrombophlebitis of other deep vessels of lower extremities   . Rash and other nonspecific skin eruption   . Spasm of muscle   . Syncope and collapse   .  Tobacco use disorder   . Unspecified essential hypertension   . Unspecified hypothyroidism   . Unspecified sinusitis (chronic)   . Unspecified tinnitus     Past Surgical History:  Procedure Laterality Date  . CATARACT EXTRACTION, BILATERAL    . EYE SURGERY     zag laser procedure.  Marland Kitchen FINGER SURGERY Left 06/18/2020  . [place] IMPLANT  2008   DR DICKERSON  . HIP SURGERY LEFT  03/2010   DR [facility]  . HIP SURGERY RIGHT  06/2010   DR [facility]  . LEFT SHOULDER  2012   DR [facility]   . LOWER BACK  05/26/2007   DR JEFF BEANE  . RIGHT BREAST BIOPSY  1986  . RIGHT KNEE  1971    There were no vitals filed for this visit.   Subjective Assessment - 09/25/20 1347    Subjective doing ok overall, had some pain after driving to [employer] and lifting bags of potting soil    Currently in Pain? Yes    Pain Score 3     Pain Location Neck    Pain Orientation Right  [facility] Adult PT Treatment/Exercise - 09/25/20 0001      Neck Exercises: Machines for Strengthening   UBE (Upper Arm Bike) L1 x2.5each      Neck Exercises: Standing   Other Standing Exercises Rev shoulder rolls 4lb x10  x5      Neck Exercises: Seated   Neck Retraction 20 reps;3 secs      Shoulder Exercises: Standing   Extension Strengthening;Both;20 reps;Theraband    Theraband Level (Shoulder Extension) Level 3 (Green)    Row Strengthening;Both;20 reps;Theraband    Theraband Level (Shoulder Row) Level 3 (Green)      Manual Therapy   Manual Therapy Passive ROM;Soft tissue mobilization    Soft tissue mobilization Upper traps, levtor, crvical paraspinales.    Passive ROM CErvcal spine in all directions                    PT Short Term Goals - 09/25/20 1425      PT SHORT TERM GOAL #1   Title indepednent with initial HEP    Status Achieved             PT Long Term Goals - 09/04/20 1346      PT LONG TERM GOAL #1   Title Patient will  be independent with advanced HEP.    Time 12    Period Weeks    Status New      PT LONG TERM GOAL #2   Title Increase cervical ROM to [facility] to allow her to be able to drive with ease.    Time 12    Period Weeks    Status New      PT LONG TERM GOAL #3   Title report that she is able to do the housework without pain >5/10    Time 12    Period Weeks    Status New      PT LONG TERM GOAL #4   Title Increase L shoulder strength to at least 4+/5 to allow her to perform heavy house duties.    Time 12    Period Weeks    Status New                 Plan - 09/25/20 1422    Clinical Impression Statement Pt tolerated an initial progression to TE well. Some initial tightness it the upper traps noted after UBE warm up with palpation. Cues needed for pacing not to perform the exercises too fast. Postural cues needed with standing rows. Pt reports a pulling sensation in neck with cervical retractions. Improved muscular elasticity in UT after exercises and MT.    Personal Factors and Comorbidities Age;Comorbidity 1    Examination-Activity Limitations Dressing;Bed Mobility;Lift;Reach Overhead;Carry    Examination-Participation Restrictions Community Activity;Laundry;Yard Work    [employer] officer Evolving/Moderate complexity    Rehab Potential Good    PT Frequency 2x / week    PT Duration 12 weeks    PT Treatment/Interventions ADLs/Self Care Home Management;Electrical Stimulation;Iontophoresis 4mg /ml Dexamethasone;Cryotherapy;Moist Heat;Traction;Ultrasound;Therapeutic activities;Therapeutic exercise;Balance training;Neuromuscular re-education;Patient/family education;Manual techniques;Passive range of motion;Dry needling;Vasopneumatic Device;Spinal Manipulations;Joint Manipulations    PT Next Visit Plan progress strengthening and ROM           Patient will benefit from skilled therapeutic intervention in order to improve the following deficits and impairments:   Hypomobility,Increased muscle spasms,Decreased range of motion,Increased edema,Decreased strength,Increased fascial restricitons,Postural dysfunction,Pain  Visit Diagnosis: Cervicalgia  Chronic left shoulder pain  Muscle weakness (generalized)     Problem List Patient Active  Problem List   Diagnosis Date Noted  . Osteopenia 11/01/2019  . Herpes simplex 10/06/2019  . Hypersomnia 10/06/2019  . Hyperlipidemia 10/06/2019  . Obesity 10/06/2019  . Palpitations 10/06/2019  . Phlebitis 10/06/2019  . Sinusitis 10/06/2019  . Syncope 10/06/2019  . Tinnitus 10/06/2019  . Vitamin D deficiency 09/08/2019  . Varicose veins of both legs with edema 08/31/2019  . Right arm pain 06/21/2018  . Bilateral leg pain 02/17/2018  . Anxiety 12/31/2017  . Cramp of toe 07/01/2016  . Lichen sclerosus of female genitalia 05/14/2016  . Low back pain 06/04/2015  . Chronic pain 05/01/2015  . Tobacco abuse 06/04/2014  . Asthma with bronchitis 06/04/2014  . H/O cardiovascular disorder 03/17/2014  . Anterior optic neuritis 03/17/2014  . Seasonal and perennial allergic rhinitis 03/06/2014  . H/O thrombosis 12/24/2013  . Personal history of venous thrombosis and embolism 12/24/2013  . Myalgia and myositis 08/23/2013  . Chronic cough 08/23/2013  . Non-toxic uninodular goiter 03/07/2013  . Allergy 11/16/2012  . Hypothyroidism 08/31/2012  . Deep venous thrombosis ([place]) 04/27/2002    Scot Jun, PTA 09/25/2020, 2:25 PM  [place]. [place], [place], [address] Phone: [phone]   Fax:  [phone]  Name: [name] MRN: [id number] Date of Birth: 03/26/1951

## 2020-09-25 NOTE — Telephone Encounter (Signed)
Left message for patient to call back and schedule Medicare Annual Wellness Visit (AWV).  Please offer to do virtually or by telephone.   Last AWV:01/18/2019   Please schedule at anytime with Nurse Health Advisor.

## 2020-09-30 ENCOUNTER — Other Ambulatory Visit: Payer: Self-pay

## 2020-09-30 ENCOUNTER — Encounter: Payer: Self-pay | Admitting: Family Medicine

## 2020-09-30 ENCOUNTER — Encounter: Payer: Self-pay | Admitting: Physical Therapy

## 2020-09-30 ENCOUNTER — Ambulatory Visit: Payer: Medicare HMO | Admitting: Physical Therapy

## 2020-09-30 DIAGNOSIS — M25512 Pain in left shoulder: Secondary | ICD-10-CM

## 2020-09-30 DIAGNOSIS — G8929 Other chronic pain: Secondary | ICD-10-CM

## 2020-09-30 DIAGNOSIS — M542 Cervicalgia: Secondary | ICD-10-CM | POA: Diagnosis not present

## 2020-09-30 DIAGNOSIS — M6281 Muscle weakness (generalized): Secondary | ICD-10-CM

## 2020-09-30 DIAGNOSIS — R600 Localized edema: Secondary | ICD-10-CM

## 2020-09-30 NOTE — Therapy (Signed)
[place]. [place], [place], [address] Phone: [phone]   Fax:  [phone]  Physical Therapy Treatment  Patient Details  Name: [name] MRN: [id number] Date of Birth: 1950/08/10 Referring Provider (PT): Dr Nolon Lennert   Encounter Date: 09/30/2020   PT End of Session - 09/30/20 1500    Visit Number 3    Date for PT Re-Evaluation 11/22/20    Authorization Type [employer]    PT Start Time 1430    PT Stop Time 1500    PT Time Calculation (min) 30 min    Activity Tolerance Patient tolerated treatment well    Behavior During Therapy [facility] for tasks assessed/performed           Past Medical History:  Diagnosis Date  . Abdominal pain, epigastric   . Abdominal pain, epigastric   . Abdominal pain, right lower quadrant   . Abnormal weight gain   . Abnormality of gait   . Anxiety state, unspecified   . Asymptomatic varicose veins   . Blood vessel replaced by other means   . Cramp of limb   . Disorder of bone and cartilage, unspecified   . Dizziness and giddiness   . Edema   . Encounter for long-term (current) use of other medications   . Herpes zoster without mention of complication   . Herpes zoster without mention of complication   . Hypersomnia, unspecified   . Insomnia, unspecified   . Long term (current) use of anticoagulants   . Lumbago   . Lumbago   . Muscle weakness (generalized)   . Myalgia and myositis, unspecified   . Obesity, unspecified   . Optic neuritis   . Other abnormal blood chemistry   . Other and unspecified hyperlipidemia   . Other cataract   . Pain in joint, site unspecified   . Pain in limb   . Pain in limb   . Palpitations   . Phlebitis and thrombophlebitis of lower extremities, unspecified   . Phlebitis and thrombophlebitis of other deep vessels of lower extremities   . Rash and other nonspecific skin eruption   . Spasm of muscle   . Syncope and collapse   .  Tobacco use disorder   . Unspecified essential hypertension   . Unspecified hypothyroidism   . Unspecified sinusitis (chronic)   . Unspecified tinnitus     Past Surgical History:  Procedure Laterality Date  . CATARACT EXTRACTION, BILATERAL    . EYE SURGERY     zag laser procedure.  [facility] FINGER SURGERY Left 06/18/2020  . [place] IMPLANT  2008   DR DICKERSON  . HIP SURGERY LEFT  03/2010   DR [facility]  . HIP SURGERY RIGHT  06/2010   DR [facility]  . LEFT SHOULDER  2012   DR [place]   . LOWER BACK  05/26/2007   DR JEFF BEANE  . RIGHT BREAST BIOPSY  1986  . RIGHT KNEE  1971    There were no vitals filed for this visit.   Subjective Assessment - 09/30/20 1431    Subjective Doing good, last night she reports almost being in tears, felt like something was pinching the muscles in her L shoulder           0/10 pain                  [facility] Adult PT Treatment/Exercise - 09/30/20 0001  Neck Exercises: Machines for Strengthening   UBE (Upper Arm Bike) L1.2  x2.5each      Neck Exercises: Standing   Other Standing Exercises 2lb flex & Abd 2x10    Other Standing Exercises shrugs  4lb 2x10  x5      Neck Exercises: Seated   Neck Retraction 20 reps;3 secs    Other Seated Exercise cervical flexion/extension x10 reps      Shoulder Exercises: Standing   Extension Strengthening;Both;Theraband;15 reps   x2   Theraband Level (Shoulder Extension) Level 3 (Green)    Row Strengthening;Both;Theraband;15 reps   x2   Theraband Level (Shoulder Row) Level 3 (Green)    Other Standing Exercises Rows & Lats 20lb 2x10                    PT Short Term Goals - 09/25/20 1425      PT SHORT TERM GOAL #1   Title indepednent with initial HEP    Status Achieved             PT Long Term Goals - 09/04/20 1346      PT LONG TERM GOAL #1   Title Patient will be independent with advanced HEP.    Time 12    Period Weeks    Status New      PT  LONG TERM GOAL #2   Title Increase cervical ROM to [facility] to allow her to be able to drive with ease.    Time 12    Period Weeks    Status New      PT LONG TERM GOAL #3   Title report that she is able to do the housework without pain >5/10    Time 12    Period Weeks    Status New      PT LONG TERM GOAL #4   Title Increase L shoulder strength to at least 4+/5 to allow her to perform heavy house duties.    Time 12    Period Weeks    Status New                 Plan - 09/30/20 1501    Clinical Impression Statement Pt did well with today's interventions. Tactile cues needed with standing shoulder Ext. Some UE fatigue noted with flex and abduction. Less stress and pull reported with cervical retractions. No pain or symptoms reported post session.    Personal Factors and Comorbidities Age;Comorbidity 1    Examination-Activity Limitations Dressing;Bed Mobility;Lift;Reach Overhead;Carry    Examination-Participation Restrictions Community Activity;Laundry;Yard Work    [employer] Evolving/Moderate complexity    Rehab Potential Good    PT Frequency 2x / week    PT Duration 12 weeks    PT Treatment/Interventions ADLs/Self Care Home Management;Electrical Stimulation;Iontophoresis 4mg /ml Dexamethasone;Cryotherapy;Moist Heat;Traction;Ultrasound;Therapeutic activities;Therapeutic exercise;Balance training;Neuromuscular re-education;Patient/family education;Manual techniques;Passive range of motion;Dry needling;Vasopneumatic Device;Spinal Manipulations;Joint Manipulations    PT Next Visit Plan progress strengthening and ROM           Patient will benefit from skilled therapeutic intervention in order to improve the following deficits and impairments:  Hypomobility,Increased muscle spasms,Decreased range of motion,Increased edema,Decreased strength,Increased fascial restricitons,Postural dysfunction,Pain  Visit Diagnosis: Cervicalgia  Chronic left shoulder  pain  Muscle weakness (generalized)  Localized edema     Problem List Patient Active Problem List   Diagnosis Date Noted  . Osteopenia 11/01/2019  . Herpes simplex 10/06/2019  . Hypersomnia 10/06/2019  . Hyperlipidemia 10/06/2019  . Obesity 10/06/2019  . Palpitations  10/06/2019  . Phlebitis 10/06/2019  . Sinusitis 10/06/2019  . Syncope 10/06/2019  . Tinnitus 10/06/2019  . Vitamin D deficiency 09/08/2019  . Varicose veins of both legs with edema 08/31/2019  . Right arm pain 06/21/2018  . Bilateral leg pain 02/17/2018  . Anxiety 12/31/2017  . Cramp of toe 07/01/2016  . Lichen sclerosus of female genitalia 05/14/2016  . Low back pain 06/04/2015  . Chronic pain 05/01/2015  . Tobacco abuse 06/04/2014  . Asthma with bronchitis 06/04/2014  . H/O cardiovascular disorder 03/17/2014  . Anterior optic neuritis 03/17/2014  . Seasonal and perennial allergic rhinitis 03/06/2014  . H/O thrombosis 12/24/2013  . Personal history of venous thrombosis and embolism 12/24/2013  . Myalgia and myositis 08/23/2013  . Chronic cough 08/23/2013  . Non-toxic uninodular goiter 03/07/2013  . Allergy 11/16/2012  . Hypothyroidism 08/31/2012  . Deep venous thrombosis ([place]) 04/27/2002    Scot Jun 09/30/2020, 3:03 PM  [place]. [place], [place], [address] Phone: [phone]   Fax:  [phone]  Name: [name] MRN: [id number] Date of Birth: 1950/08/24

## 2020-10-09 ENCOUNTER — Other Ambulatory Visit: Payer: Self-pay

## 2020-10-09 ENCOUNTER — Encounter: Payer: Self-pay | Admitting: Physical Therapy

## 2020-10-09 ENCOUNTER — Ambulatory Visit: Payer: Medicare HMO | Admitting: Physical Therapy

## 2020-10-09 DIAGNOSIS — G8929 Other chronic pain: Secondary | ICD-10-CM | POA: Diagnosis not present

## 2020-10-09 DIAGNOSIS — M542 Cervicalgia: Secondary | ICD-10-CM

## 2020-10-09 DIAGNOSIS — M25512 Pain in left shoulder: Secondary | ICD-10-CM | POA: Diagnosis not present

## 2020-10-09 DIAGNOSIS — R600 Localized edema: Secondary | ICD-10-CM | POA: Diagnosis not present

## 2020-10-09 DIAGNOSIS — M6281 Muscle weakness (generalized): Secondary | ICD-10-CM | POA: Diagnosis not present

## 2020-10-09 NOTE — Patient Instructions (Signed)
Access Code: [email or web address] URL: [email or web address] Date: 10/09/2020 Prepared by: Cheri Fowler  Exercises Shoulder Abduction - Thumbs Up - 1 x daily - 7 x weekly - 3 sets - 10 reps Standing Shoulder Flexion to 90 Degrees - 1 x daily - 7 x weekly - 3 sets - 10 reps Standing Shoulder Shrugs - 1 x daily - 7 x weekly - 3 sets - 10 reps

## 2020-10-09 NOTE — Therapy (Signed)
[place]. [place], [place], [address] Phone: [phone]   Fax:  [phone]  Physical Therapy Treatment  Patient Details  Name: [name] MRN: [id number] Date of Birth: 1951-03-16 Referring Provider (PT): Dr Nolon Lennert   Encounter Date: 10/09/2020   PT End of Session - 10/09/20 1504     Visit Number 4    Date for PT Re-Evaluation 11/22/20    Authorization Type [employer] Medicare    PT Start Time 1430    PT Stop Time 1504    PT Time Calculation (min) 34 min    Activity Tolerance Patient tolerated treatment well    Behavior During Therapy [facility] for tasks assessed/performed             Past Medical History:  Diagnosis Date   Abdominal pain, epigastric    Abdominal pain, epigastric    Abdominal pain, right lower quadrant    Abnormal weight gain    Abnormality of gait    Anxiety state, unspecified    Asymptomatic varicose veins    Blood vessel replaced by other means    Cramp of limb    Disorder of bone and cartilage, unspecified    Dizziness and giddiness    Edema    Encounter for long-term (current) use of other medications    Herpes zoster without mention of complication    Herpes zoster without mention of complication    Hypersomnia, unspecified    Insomnia, unspecified    Long term (current) use of anticoagulants    Lumbago    Lumbago    Muscle weakness (generalized)    Myalgia and myositis, unspecified    Obesity, unspecified    Optic neuritis    Other abnormal blood chemistry    Other and unspecified hyperlipidemia    Other cataract    Pain in joint, site unspecified    Pain in limb    Pain in limb    Palpitations    Phlebitis and thrombophlebitis of lower extremities, unspecified    Phlebitis and thrombophlebitis of other deep vessels of lower extremities    Rash and other nonspecific skin eruption    Spasm of muscle    Syncope and collapse    Tobacco use disorder     Unspecified essential hypertension    Unspecified hypothyroidism    Unspecified sinusitis (chronic)    Unspecified tinnitus     Past Surgical History:  Procedure Laterality Date   CATARACT EXTRACTION, BILATERAL     EYE SURGERY     zag laser procedure.   FINGER SURGERY Left 06/18/2020   [place] IMPLANT  2008   DR [facility]   HIP SURGERY LEFT  03/2010   DR CHRIS [facility]   HIP SURGERY RIGHT  06/2010   DR [facility]   LEFT SHOULDER  2012   DR CHRIS [facility]    LOWER BACK  05/26/2007   DR JEFF BEANE   RIGHT BREAST BIOPSY  1986   RIGHT KNEE  1971    There were no vitals filed for this visit.   Subjective Assessment - 10/09/20 1431     Subjective Feeling really good today, has not felt good in several days.    Currently in Pain? No/denies                [facility] PT Assessment - 10/09/20 0001       AROM   AROM Assessment Site Cervical    Cervical Flexion  WFL    Cervical Extension WFL    Cervical - Right Side Bend 50% limited    Cervical - Left Side Bend 50% limited    Cervical - Right Rotation 25% Limited    Cervical - Left Rotation 25% limited                           [facility] Adult PT Treatment/Exercise - 10/09/20 0001       Neck Exercises: Machines for Strengthening   UBE (Upper Arm Bike) L1.2  x3 each      Neck Exercises: Standing   Other Standing Exercises 2lb flex & Abd 2x10    Other Standing Exercises shrugs  3lb 2x10      Neck Exercises: Seated   Neck Retraction 20 reps;3 secs    Other Seated Exercise [facility] yellow 2x10      Shoulder Exercises: Standing   Extension Strengthening;20 reps;Weights    Extension Weight (lbs) 5    Row Strengthening;Both;20 reps;Weights    Row Weight (lbs) 10                      PT Short Term Goals - 09/25/20 1425       PT SHORT TERM GOAL #1   Title indepednent with initial HEP    Status Achieved               PT Long Term Goals - 10/09/20 1459       PT  LONG TERM GOAL #1   Title Patient will be independent with advanced HEP.      PT LONG TERM GOAL #2   Title Increase cervical ROM to [facility] to allow her to be able to drive with ease.    Status Partially Met      PT LONG TERM GOAL #3   Title report that she is able to do the housework without pain >5/10    Status Partially Met      PT LONG TERM GOAL #4   Title Increase L shoulder strength to at least 4+/5 to allow her to perform heavy house duties.    Status On-going                   Plan - 10/09/20 1504     Clinical Impression Statement Pt did well today and has progressed towards goals. Gave pt updated HEP.  Postural cues needed with standing rows and shoulder extensions. He has progressed increasing her cervical flexion and extension ROM. Cues needed to hold cervical retractions for two seconds.    Personal Factors and Comorbidities Age;Comorbidity 1    Examination-Activity Limitations Dressing;Bed Mobility;Lift;Reach Overhead;Carry    Examination-Participation Restrictions Community Activity;Laundry;Yard Work    [employer] officer Evolving/Moderate complexity    Rehab Potential Good    PT Frequency 2x / week    PT Duration 12 weeks    PT Treatment/Interventions ADLs/Self Care Home Management;Electrical Stimulation;Iontophoresis 36m/ml Dexamethasone;Cryotherapy;Moist Heat;Traction;Ultrasound;Therapeutic activities;Therapeutic exercise;Balance training;Neuromuscular re-education;Patient/family education;Manual techniques;Passive range of motion;Dry needling;Vasopneumatic Device;Spinal Manipulations;Joint Manipulations    PT Next Visit Plan progress strengthening and ROM             Patient will benefit from skilled therapeutic intervention in order to improve the following deficits and impairments:  Hypomobility, Increased muscle spasms, Decreased range of motion, Increased edema, Decreased strength, Increased fascial restricitons, Postural dysfunction,  Pain  Visit Diagnosis: Cervicalgia  Localized edema  Chronic left shoulder pain  Muscle weakness (generalized)     Problem List Patient Active Problem List   Diagnosis Date Noted   Osteopenia 11/01/2019   Herpes simplex 10/06/2019   Hypersomnia 10/06/2019   Hyperlipidemia 10/06/2019   Obesity 10/06/2019   Palpitations 10/06/2019   Phlebitis 10/06/2019   Sinusitis 10/06/2019   Syncope 10/06/2019   Tinnitus 10/06/2019   Vitamin D deficiency 09/08/2019   Varicose veins of both legs with edema 08/31/2019   Right arm pain 06/21/2018   Bilateral leg pain 02/17/2018   Anxiety 12/31/2017   Cramp of toe 41/06/129   Lichen sclerosus of female genitalia 05/14/2016   Low back pain 06/04/2015   Chronic pain 05/01/2015   Tobacco abuse 06/04/2014   Asthma with bronchitis 06/04/2014   H/O cardiovascular disorder 03/17/2014   Anterior optic neuritis 03/17/2014   Seasonal and perennial allergic rhinitis 03/06/2014   H/O thrombosis 12/24/2013   Personal history of venous thrombosis and embolism 12/24/2013   Myalgia and myositis 08/23/2013   Chronic cough 08/23/2013   Non-toxic uninodular goiter 03/07/2013   Allergy 11/16/2012   Hypothyroidism 08/31/2012   Deep venous thrombosis ([place]) 04/27/2002    Scot Jun 10/09/2020, 3:10 PM  [place]. [place], [place], [address] Phone: [phone]   Fax:  [phone]  Name: [name] MRN: [id number] Date of Birth: [birth year]

## 2020-12-09 ENCOUNTER — Encounter: Payer: Self-pay | Admitting: Family Medicine

## 2020-12-09 ENCOUNTER — Ambulatory Visit (INDEPENDENT_AMBULATORY_CARE_PROVIDER_SITE_OTHER): Payer: Medicare HMO | Admitting: Family Medicine

## 2020-12-09 ENCOUNTER — Other Ambulatory Visit: Payer: Self-pay

## 2020-12-09 VITALS — BP 142/74 | HR 81 | Temp 97.4°F | Ht 67.0 in | Wt 180.4 lb

## 2020-12-09 DIAGNOSIS — E039 Hypothyroidism, unspecified: Secondary | ICD-10-CM | POA: Diagnosis not present

## 2020-12-09 DIAGNOSIS — R69 Illness, unspecified: Secondary | ICD-10-CM | POA: Diagnosis not present

## 2020-12-09 DIAGNOSIS — J309 Allergic rhinitis, unspecified: Secondary | ICD-10-CM | POA: Diagnosis not present

## 2020-12-09 DIAGNOSIS — Z0000 Encounter for general adult medical examination without abnormal findings: Secondary | ICD-10-CM | POA: Insufficient documentation

## 2020-12-09 DIAGNOSIS — Z1211 Encounter for screening for malignant neoplasm of colon: Secondary | ICD-10-CM | POA: Insufficient documentation

## 2020-12-09 DIAGNOSIS — F17210 Nicotine dependence, cigarettes, uncomplicated: Secondary | ICD-10-CM

## 2020-12-09 DIAGNOSIS — R030 Elevated blood-pressure reading, without diagnosis of hypertension: Secondary | ICD-10-CM | POA: Diagnosis not present

## 2020-12-09 DIAGNOSIS — R519 Headache, unspecified: Secondary | ICD-10-CM

## 2020-12-09 LAB — CBC
HCT: 41.4 % (ref 36.0–46.0)
Hemoglobin: 13.8 g/dL (ref 12.0–15.0)
MCHC: 33.4 g/dL (ref 30.0–36.0)
MCV: 89.8 fl (ref 78.0–100.0)
Platelets: 252 10*3/uL (ref 150.0–400.0)
RBC: 4.61 Mil/uL (ref 3.87–5.11)
RDW: 14.6 % (ref 11.5–15.5)
WBC: 8.6 10*3/uL (ref 4.0–10.5)

## 2020-12-09 LAB — COMPREHENSIVE METABOLIC PANEL
ALT: 16 U/L (ref 0–35)
AST: 17 U/L (ref 0–37)
Albumin: 4.3 g/dL (ref 3.5–5.2)
Alkaline Phosphatase: 77 U/L (ref 39–117)
BUN: 13 mg/dL (ref 6–23)
CO2: 26 mEq/L (ref 19–32)
Calcium: 9.3 mg/dL (ref 8.4–10.5)
Chloride: 103 mEq/L (ref 96–112)
Creatinine, Ser: 1.02 mg/dL (ref 0.40–1.20)
GFR: 55.57 mL/min — ABNORMAL LOW (ref >60.00)
Glucose, Bld: 88 mg/dL (ref 70–99)
Potassium: 3.9 mEq/L (ref 3.5–5.1)
Sodium: 137 mEq/L (ref 135–145)
Total Bilirubin: 0.4 mg/dL (ref 0.2–1.2)
Total Protein: 7.6 g/dL (ref 6.0–8.3)

## 2020-12-09 LAB — TSH: TSH: 1.88 u[IU]/mL (ref 0.35–5.50)

## 2020-12-09 MED ORDER — CETIRIZINE HCL 10 MG PO TABS: 10.0000 mg | ORAL_TABLET | Freq: Every day | ORAL | 11 refills | Status: DC

## 2020-12-09 NOTE — Progress Notes (Signed)
Established Patient Office Visit  Subjective:  Patient ID: [name], female    DOB: 1950-07-22  Age: 70 y.o. MRN: [id number]  CC:  Chief Complaint  Patient presents with   Headache    C/O of headaches that come and go x 1 month.     HPI [employer] presents for evaluation of a 1 to 32-monthhistory of intermittent headaches.  There are no prodromal symptoms.  They tend to be located in her right posterior occiput, left posterior occiput and on top of her head.  These headaches come and go randomly.  They last for seconds.  There is no nausea or blurred vision with them.  They may be related to a neck injury she sustained a month or so ago.  Neck is doing better.  It is still stiff.  Was unable to complete physical therapy secondary to cost constraints.  She has an ongoing history of itchy watery eyes ears nose and throat, sneezing and copious rhinorrhea.  She did not tolerate Flonase in the past.  Has an allergy to hydrocortisone.  She smokes.  Past Medical History:  Diagnosis Date   Abdominal pain, epigastric    Abdominal pain, epigastric    Abdominal pain, right lower quadrant    Abnormal weight gain    Abnormality of gait    Anxiety state, unspecified    Asymptomatic varicose veins    Blood vessel replaced by other means    Cramp of limb    Disorder of bone and cartilage, unspecified    Dizziness and giddiness    Edema    Encounter for long-term (current) use of other medications    Herpes zoster without mention of complication    Herpes zoster without mention of complication    Hypersomnia, unspecified    Insomnia, unspecified    Long term (current) use of anticoagulants    Lumbago    Lumbago    Muscle weakness (generalized)    Myalgia and myositis, unspecified    Obesity, unspecified    Optic neuritis    Other abnormal blood chemistry    Other and unspecified hyperlipidemia    Other cataract    Pain in joint, site unspecified    Pain in limb    Pain  in limb    Palpitations    Phlebitis and thrombophlebitis of lower extremities, unspecified    Phlebitis and thrombophlebitis of other deep vessels of lower extremities    Rash and other nonspecific skin eruption    Spasm of muscle    Syncope and collapse    Tobacco use disorder    Unspecified essential hypertension    Unspecified hypothyroidism    Unspecified sinusitis (chronic)    Unspecified tinnitus     Past Surgical History:  Procedure Laterality Date   CATARACT EXTRACTION, BILATERAL     EYE SURGERY     zag laser procedure.   FINGER SURGERY Left 06/18/2020   GREENWOOD FIL[facility]  2008   [facility]  [facility]  [facility]/2010   [facility]  [facility]2010   [facility]   LOWER BACK  05/26/2007   DR JEFF [facility]   RIGHT BREAST BIOPSY  1986   RIGHT KNEE  1971    Family History  Problem Relation Age of Onset   Stroke Mother    Stroke Father    Heart  disease Father    Stroke Brother     Social History   Socioeconomic History   Marital status: Widowed    Spouse name: [name]   Number of children: 0   Years of education: college   Highest education level: Not on file  Occupational History    Employer: [employer]    Comment: answers phones  Tobacco Use   Smoking status: Heavy Smoker    Packs/day: 1.50    Years: 52.00    Pack years: 78.00    Types: Cigarettes   Smokeless tobacco: Never   Tobacco comments:    2 packs daily-01/24/2020  Vaping Use   Vaping Use: Never used  Substance and Sexual Activity   Alcohol use: Yes   Drug use: No   Sexual activity: Not Currently    Partners: Male    Comment: husband  Other Topics Concern   Not on file  Social History Narrative   Patient  Lives at home wit her husband [name]) . Patient still works.college education.   Right handed.   Caffiene -None   Social Determinants of Radio broadcast assistant Strain: Not on file  Food  Insecurity: Not on file  Transportation Needs: Not on file  Physical Activity: Not on file  Stress: Not on file  Social Connections: Not on file  Intimate Partner Violence: Not on file    Outpatient Medications Prior to Visit  Medication Sig Dispense Refill   aspirin 325 MG tablet Take 325 mg by mouth daily.     Cholecalciferol (VITAMIN D3 PO) Take 1 tablet by mouth daily.     cyclobenzaprine (FLEXERIL) 5 MG tablet Take 1 tablet (5 mg total) by mouth 3 (three) times daily as needed for muscle spasms. 30 tablet 1   EPINEPHRINE 0.3 mg/0.3 mL IJ SOAJ injection INJECT 0.3 MLS (0.3 MG TOTAL) INTO THE MUSCLE AS NEEDED FOR ANAPHYLAXIS. 2 each 1   Polyethyl Glycol-Propyl Glycol (SYSTANE PRESERVATIVE FREE OP) Apply to eye.     TIROSINT 75 MCG CAPS Take 1 capsule (75 mcg total) by mouth daily. 90 capsule 3   triamcinolone ointment (KENALOG) 0.1 % triamcinolone acetonide 0.1 % topical ointment  APPLY A THIN LAYER TO THE AFFECTED vulvar AREA(S) BY TOPICAL ROUTE daily     triamcinolone cream (KENALOG) 0.1 % Apply topically 2 (two) times daily.     No facility-administered medications prior to visit.    Allergies  Allergen Reactions   Cortizone-10 [Hydrocortisone] Hives   Other Anaphylaxis    Beans,carbonation, dust and mold,floride,fried foods, household cleaner, melon,mushrooms,peppers,preservatives,rasisns,spicey,spinach,trees,milk products,shellfish   Shellfish Allergy Other (See Comments) and Anaphylaxis    Death   Synthroid [Levothyroxine Sodium] Anaphylaxis    Internal shaking    Azithromycin Hives   Betadine [Povidone Iodine]     Aspiration    Calcium Channel Blockers     Asthma   Codeine     Flu-like symptoms    Duloxetine Itching and Nausea Only    Dry mouth   Fluoride Preparations Other (See Comments)    Blisters   Iodine Other (See Comments)    Aspiration    Levaquin [Levofloxacin In D5w]    Lyrica [Pregabalin] Other (See Comments)    Make her very mean and angery    Novocain [Procaine] Other (See Comments)    Heart Palpations   Peanuts [Peanut Oil] Swelling    **Allergy to ALL NUTS** Face Swelling    Penicillins    Pollen Extract Other (See Comments)  Hemorrhage    Prozac [Fluoxetine Hcl]    Wellbutrin [Bupropion] Hives    After 1 pill fist size bumps appeared   Lanolin Rash   Molds & Smuts Other (See Comments)   Mushroom Extract Complex Nausea And Vomiting    ROS Review of Systems  Constitutional:  Negative for chills, diaphoresis, fatigue, fever and unexpected weight change.  HENT:  Positive for congestion, rhinorrhea and sneezing. Negative for sinus pressure, sinus pain and voice change.   Eyes:  Positive for itching. Negative for photophobia and visual disturbance.  Respiratory:  Positive for choking. Negative for shortness of breath and wheezing.   Gastrointestinal:  Negative for nausea and vomiting.  Endocrine: Negative for polyphagia and polyuria.  Genitourinary: Negative.   Musculoskeletal:  Positive for neck pain and neck stiffness.  Neurological:  Positive for headaches. Negative for speech difficulty and weakness.     Objective:    Physical Exam Vitals and nursing note reviewed.  Constitutional:      General: She is not in acute distress.    Appearance: She is well-developed. She is not ill-appearing, toxic-appearing or diaphoretic.  HENT:     Head: Normocephalic and atraumatic.  Eyes:     General: No scleral icterus.    Extraocular Movements: Extraocular movements intact.     Pupils: Pupils are equal, round, and reactive to light. Pupils are equal.  Cardiovascular:     Rate and Rhythm: Normal rate and regular rhythm.  Pulmonary:     Effort: Pulmonary effort is normal.     Breath sounds: Normal breath sounds. No wheezing, rhonchi or rales.  Abdominal:     General: Bowel sounds are normal.  Musculoskeletal:     Cervical back: Normal range of motion and neck supple. No rigidity.  Lymphadenopathy:     Cervical: No  cervical adenopathy.  Neurological:     Mental Status: She is alert and oriented to person, place, and time.     Cranial Nerves: No cranial nerve deficit, dysarthria or facial asymmetry.  Psychiatric:        Mood and Affect: Mood normal.        Behavior: Behavior normal.    BP (!) 142/74 (BP Location: Left Arm, Patient Position: Sitting, Cuff Size: Normal)   Pulse 81   Temp (!) 97.4 F (36.3 C) (Temporal)   Ht '5\' 7"'$  (1.702 m)   Wt 180 lb 6.4 oz (81.8 kg)   SpO2 95%   BMI 28.25 kg/m  Wt Readings from Last 3 Encounters:  12/09/20 180 lb 6.4 oz (81.8 kg)  08/22/20 179 lb 9.6 oz (81.5 kg)  01/24/20 181 lb 6.4 oz (82.3 kg)     Health Maintenance Due  Topic Date Due   MAMMOGRAM  01/03/2000   PNA vac Low Risk Adult (2 of 2 - PPSV23) 04/27/2017   PAP SMEAR-Modifier  01/09/2019   Fecal DNA (Cologuard)  10/03/2020   INFLUENZA VACCINE  11/25/2020    There are no preventive care reminders to display for this patient.  Lab Results  Component Value Date   TSH 1.000 11/25/2016   Lab Results  Component Value Date   WBC 8.4 08/30/2019   HGB 14.9 08/30/2019   HCT 43.7 08/30/2019   MCV 85.5 08/30/2019   PLT 243.0 08/30/2019   Lab Results  Component Value Date   NA 140 08/30/2019   K 4.5 08/30/2019   CO2 28 08/30/2019   GLUCOSE 95 08/30/2019   BUN 12 08/30/2019   CREATININE 0.76 08/30/2019  BILITOT 0.5 11/25/2016   ALKPHOS 85 11/25/2016   AST 12 08/30/2019   ALT 9 08/30/2019   PROT 6.5 11/25/2016   ALBUMIN 4.5 11/25/2016   CALCIUM 9.2 08/30/2019   GFR 75.31 08/30/2019   Lab Results  Component Value Date   CHOL 207 (H) 08/30/2019   Lab Results  Component Value Date   HDL 63.70 08/30/2019   Lab Results  Component Value Date   LDLCALC 126 (H) 08/30/2019   Lab Results  Component Value Date   TRIG 87.0 08/30/2019   Lab Results  Component Value Date   CHOLHDL 3 08/30/2019   No results found for: HGBA1C    Assessment & Plan:   Problem List Items  Addressed This Visit       Respiratory   Allergic rhinitis   Relevant Medications   cetirizine (ZYRTEC) 10 MG tablet     Endocrine   Hypothyroidism - Primary (Chronic)   Relevant Orders   TSH     Other   Healthcare maintenance   Cigarette nicotine dependence without complication   Relevant Orders   Ambulatory referral to Smoking Cessation Program   Elevated BP without diagnosis of hypertension   Relevant Orders   CBC   Comprehensive metabolic panel   Urinalysis, Routine w reflex microscopic   Nonintractable episodic headache    Meds ordered this encounter  Medications   cetirizine (ZYRTEC) 10 MG tablet    Sig: Take 1 tablet (10 mg total) by mouth daily.    Dispense:  30 tablet    Refill:  11    Follow-up: Return in about 1 month (around 01/09/2021).  Patient's complaint of headaches could be relateted to her blood pressure and/or her longstanding history of allergies.  We will try Zyrtec daily.  She will follow-up in 1 month.  A screening CT scan has been ordered for her and encouraged her to go for it.  Have referred her to the smoking cessation program.  Rechecking her TSH.  Given information on coping with quitting smoking and also preventing hypertension.  Libby Maw, MD

## 2020-12-09 NOTE — Addendum Note (Signed)
Addended by: Lynnea Ferrier on: 12/09/2020 04:19 PM   Modules accepted: Orders

## 2020-12-10 LAB — URINALYSIS, ROUTINE W REFLEX MICROSCOPIC
Bilirubin Urine: NEGATIVE
Glucose, UA: NEGATIVE
Hgb urine dipstick: NEGATIVE
Ketones, ur: NEGATIVE
Leukocytes,Ua: NEGATIVE
Nitrite: NEGATIVE
Protein, ur: NEGATIVE
Specific Gravity, Urine: 1.019 (ref 1.001–1.035)
pH: 5.5 (ref 5.0–8.0)

## 2020-12-10 NOTE — Progress Notes (Signed)
Labs are mostly okay. Kidney function has fallen a bit. Be sure to follow up in one month as planned for a recheck on your blood pressure.

## 2020-12-11 ENCOUNTER — Telehealth: Payer: Self-pay | Admitting: Family Medicine

## 2020-12-11 ENCOUNTER — Telehealth: Payer: Self-pay | Admitting: Acute Care

## 2020-12-11 NOTE — Telephone Encounter (Signed)
Pt called in and said that she was worried about taking cetirizine (ZYRTEC) 10 MG tablet but ended up taking yesterday morning and fell asleep immediately and slept until 6 when her dog woke her up and then she went back to sleep shortly after that and still slept all night. She wanted to know if maybe she should try half a pill or if she should take at night. Please advise

## 2020-12-11 NOTE — Telephone Encounter (Signed)
Spoke with patient who verbally understood she should try taking her allergy medication at bed time if she still seems to be having issues with sleeping more during the please let us know.

## 2020-12-12 NOTE — Telephone Encounter (Signed)
Left detailed message and voicemail per DPR that next low dose CT wont be due until 01/14/21 and that she will be getting a call in the next few weeks to get his set up. I left my call back number if anything further is needed.

## 2020-12-17 ENCOUNTER — Ambulatory Visit: Payer: Medicare HMO

## 2020-12-17 ENCOUNTER — Telehealth: Payer: Self-pay | Admitting: Family Medicine

## 2020-12-17 NOTE — Telephone Encounter (Signed)
Pt called, says Zyrtec is making her dizzy. Also, she is having pain in waist and back. Would like to speak with nurse.

## 2020-12-18 NOTE — Telephone Encounter (Signed)
Returned patients call no answer LMTCB 

## 2020-12-19 ENCOUNTER — Telehealth: Payer: Self-pay | Admitting: Family Medicine

## 2020-12-19 NOTE — Telephone Encounter (Signed)
Pt stopped taking her Zrytec 2 to 3 days ago and is still dizzy. She wonders if it's bc of a [employer] brand D3 pill. Would like to speak with Tequila.

## 2020-12-24 NOTE — Telephone Encounter (Signed)
Appointment scheduled for OV

## 2021-01-09 ENCOUNTER — Ambulatory Visit: Payer: Medicare HMO | Admitting: Family Medicine

## 2021-01-09 DIAGNOSIS — D225 Melanocytic nevi of trunk: Secondary | ICD-10-CM | POA: Diagnosis not present

## 2021-01-09 DIAGNOSIS — L821 Other seborrheic keratosis: Secondary | ICD-10-CM | POA: Diagnosis not present

## 2021-01-09 DIAGNOSIS — L308 Other specified dermatitis: Secondary | ICD-10-CM | POA: Diagnosis not present

## 2021-01-09 DIAGNOSIS — D2239 Melanocytic nevi of other parts of face: Secondary | ICD-10-CM | POA: Diagnosis not present

## 2021-01-09 DIAGNOSIS — L578 Other skin changes due to chronic exposure to nonionizing radiation: Secondary | ICD-10-CM | POA: Diagnosis not present

## 2021-01-09 DIAGNOSIS — L853 Xerosis cutis: Secondary | ICD-10-CM | POA: Diagnosis not present

## 2021-01-10 ENCOUNTER — Encounter: Payer: Self-pay | Admitting: Family Medicine

## 2021-01-10 ENCOUNTER — Telehealth: Payer: Self-pay | Admitting: Family Medicine

## 2021-01-10 ENCOUNTER — Other Ambulatory Visit: Payer: Self-pay

## 2021-01-10 ENCOUNTER — Ambulatory Visit (INDEPENDENT_AMBULATORY_CARE_PROVIDER_SITE_OTHER): Payer: Medicare HMO | Admitting: Family Medicine

## 2021-01-10 VITALS — BP 118/64 | HR 81 | Temp 97.7°F | Ht 67.0 in | Wt 183.0 lb

## 2021-01-10 DIAGNOSIS — J309 Allergic rhinitis, unspecified: Secondary | ICD-10-CM | POA: Diagnosis not present

## 2021-01-10 DIAGNOSIS — E7800 Pure hypercholesterolemia, unspecified: Secondary | ICD-10-CM

## 2021-01-10 DIAGNOSIS — R030 Elevated blood-pressure reading, without diagnosis of hypertension: Secondary | ICD-10-CM | POA: Diagnosis not present

## 2021-01-10 MED ORDER — MOMETASONE FUROATE 50 MCG/ACT NA SUSP: NASAL | 12 refills | Status: DC

## 2021-01-10 NOTE — Progress Notes (Addendum)
Established Patient Office Visit  Subjective:  Patient ID: [name], female    DOB: June 03, 1950  Age: 70 y.o. MRN: [id number]  CC:  Chief Complaint  Patient presents with   Follow-up    1 month follow up on headaches, per patient she still seems to be having more headaches.     HPI [employer] presents for follow-up of allergy rhinitis and headaches.  She had developed a spinning sensation after taking Zyrtec.  This is mostly resolved.  Ongoing nasal congestion postnasal drip and sneezing.  She had tried Flonase in the past for couple months and did not seem to help.  She asks about what else she can do.  Would prefer not to take prednisone.  Headaches seem to be better.  They are not as often and have not increased in intensity.  She cannot have a colonoscopy secondary to problems with the prep.  Has been monitoring blood pressure at home and it is running in the 120 over 70s range.  Past Medical History:  Diagnosis Date   Abdominal pain, epigastric    Abdominal pain, epigastric    Abdominal pain, right lower quadrant    Abnormal weight gain    Abnormality of gait    Anxiety state, unspecified    Asymptomatic varicose veins    Blood vessel replaced by other means    Cramp of limb    Disorder of bone and cartilage, unspecified    Dizziness and giddiness    Edema    Encounter for long-term (current) use of other medications    Herpes zoster without mention of complication    Herpes zoster without mention of complication    Hypersomnia, unspecified    Insomnia, unspecified    Long term (current) use of anticoagulants    Lumbago    Lumbago    Muscle weakness (generalized)    Myalgia and myositis, unspecified    Obesity, unspecified    Optic neuritis    Other abnormal blood chemistry    Other and unspecified hyperlipidemia    Other cataract    Pain in joint, site unspecified    Pain in limb    Pain in limb    Palpitations    Phlebitis and thrombophlebitis of  lower extremities, unspecified    Phlebitis and thrombophlebitis of other deep vessels of lower extremities    Rash and other nonspecific skin eruption    Spasm of muscle    Syncope and collapse    Tobacco use disorder    Unspecified essential hypertension    Unspecified hypothyroidism    Unspecified sinusitis (chronic)    Unspecified tinnitus     Past Surgical History:  Procedure Laterality Date   CATARACT EXTRACTION, BILATERAL     EYE SURGERY     zag laser procedure.   FINGER SURGERY Left 06/18/2020   GREENWOOD FILTER IMPLANT  2008   [facility]   HIP SURGERY LEFT  03/2010   [facility]   HIP SURGERY RIGHT  06/2010   DR [place]   LEFT SHOULDER  2012   [facility]    LOWER BACK  05/26/2007   DR [facility]   RIGHT BREAST BIOPSY  1986   RIGHT KNEE  1971    Family History  Problem Relation Age of Onset   Stroke Mother    Stroke Father    Heart disease Father    Stroke Brother     Social History  Socioeconomic History   Marital status: Widowed    Spouse name: [name]   Number of children: 0   Years of education: college   Highest education level: Not on file  Occupational History    Employer: [employer]    Comment: answers phones  Tobacco Use   Smoking status: Heavy Smoker    Packs/day: 1.50    Years: 52.00    Pack years: 78.00    Types: Cigarettes   Smokeless tobacco: Never   Tobacco comments:    2 packs daily-01/24/2020  Vaping Use   Vaping Use: Never used  Substance and Sexual Activity   Alcohol use: Yes   Drug use: No   Sexual activity: Not Currently    Partners: Male    Comment: husband  Other Topics Concern   Not on file  Social History Narrative   Patient  Lives at home wit her husband [name]) . Patient still works.college education.   Right handed.   Caffiene -None   Social Determinants of Radio broadcast assistant Strain: Not on file  Food Insecurity: Not on file  Transportation Needs: Not on file   Physical Activity: Not on file  Stress: Not on file  Social Connections: Not on file  Intimate Partner Violence: Not on file    Outpatient Medications Prior to Visit  Medication Sig Dispense Refill   aspirin 325 MG tablet Take 325 mg by mouth daily.     Cholecalciferol (VITAMIN D3 PO) Take 1 tablet by mouth daily.     cyclobenzaprine (FLEXERIL) 5 MG tablet Take 1 tablet (5 mg total) by mouth 3 (three) times daily as needed for muscle spasms. 30 tablet 1   EPINEPHRINE 0.3 mg/0.3 mL IJ SOAJ injection INJECT 0.3 MLS (0.3 MG TOTAL) INTO THE MUSCLE AS NEEDED FOR ANAPHYLAXIS. 2 each 1   Polyethyl Glycol-Propyl Glycol (SYSTANE PRESERVATIVE FREE OP) Apply to eye.     TIROSINT 75 MCG CAPS Take 1 capsule (75 mcg total) by mouth daily. 90 capsule 3   triamcinolone ointment (KENALOG) 0.1 % triamcinolone acetonide 0.1 % topical ointment  APPLY A THIN LAYER TO THE AFFECTED vulvar AREA(S) BY TOPICAL ROUTE daily     cetirizine (ZYRTEC) 10 MG tablet Take 1 tablet (10 mg total) by mouth daily. (Patient not taking: Reported on 01/10/2021) 30 tablet 11   No facility-administered medications prior to visit.    Allergies  Allergen Reactions   Cortizone-10 [Hydrocortisone] Hives   Other Anaphylaxis    Beans,carbonation, dust and mold,floride,fried foods, household cleaner, melon,mushrooms,peppers,preservatives,rasisns,spicey,spinach,trees,milk products,shellfish   Shellfish Allergy Other (See Comments) and Anaphylaxis    Death   Synthroid [Levothyroxine Sodium] Anaphylaxis    Internal shaking    Azithromycin Hives   Betadine [Povidone Iodine]     Aspiration    Calcium Channel Blockers     Asthma   Codeine     Flu-like symptoms    Duloxetine Itching and Nausea Only    Dry mouth   Fluoride Preparations Other (See Comments)    Blisters   Iodine Other (See Comments)    Aspiration    Levaquin [Levofloxacin In D5w]    Lyrica [Pregabalin] Other (See Comments)    Make her very mean and angery    Novocain [Procaine] Other (See Comments)    Heart Palpations   Peanuts [Peanut Oil] Swelling    **Allergy to ALL NUTS** Face Swelling    Penicillins    Pollen Extract Other (See Comments)    Hemorrhage    Prozac [  Fluoxetine Hcl]    Wellbutrin [Bupropion] Hives    After 1 pill fist size bumps appeared   Lanolin Rash   Molds & Smuts Other (See Comments)   Mushroom Extract Complex Nausea And Vomiting    ROS Review of Systems  Constitutional:  Negative for chills, diaphoresis, fatigue, fever and unexpected weight change.  HENT:  Positive for congestion, postnasal drip and sneezing. Negative for hearing loss.   Eyes:  Negative for photophobia and visual disturbance.  Respiratory:  Positive for cough.   Cardiovascular: Negative.   Gastrointestinal: Negative.   Genitourinary: Negative.   Musculoskeletal:  Negative for gait problem and joint swelling.  Neurological:  Positive for headaches.  Psychiatric/Behavioral: Negative.       Depression screen [facility] 2/9 01/10/2021 12/09/2020 01/18/2019  Decreased Interest 0 0 0  Down, Depressed, Hopeless 0 0 1  PHQ - 2 Score 0 0 1  Altered sleeping - - -  Tired, decreased energy - - -  Change in appetite - - -  Feeling bad or failure about yourself  - - -  Trouble concentrating - - -  Moving slowly or fidgety/restless - - -  Suicidal thoughts - - -  PHQ-9 Score - - -     Objective:    Physical Exam Vitals and nursing note reviewed.  Constitutional:      General: She is not in acute distress.    Appearance: Normal appearance. She is not ill-appearing, toxic-appearing or diaphoretic.  HENT:     Head: Normocephalic and atraumatic.     Right Ear: Tympanic membrane, ear canal and external ear normal.     Left Ear: Tympanic membrane, ear canal and external ear normal.     Mouth/Throat:     Mouth: Mucous membranes are moist.     Tongue: No lesions. Tongue does not deviate from midline.     Pharynx: Oropharynx is clear. No oropharyngeal  exudate or posterior oropharyngeal erythema.  Eyes:     General:        Right eye: No discharge.        Left eye: No discharge.     Extraocular Movements: Extraocular movements intact.     Conjunctiva/sclera: Conjunctivae normal.     Pupils: Pupils are equal, round, and reactive to light.  Neck:     Vascular: No carotid bruit.  Cardiovascular:     Rate and Rhythm: Normal rate and regular rhythm.  Pulmonary:     Effort: Pulmonary effort is normal. No respiratory distress.     Breath sounds: Normal breath sounds. No stridor. No wheezing, rhonchi or rales.  Abdominal:     General: Bowel sounds are normal.  Musculoskeletal:     Cervical back: No rigidity or tenderness.     Right lower leg: No edema.     Left lower leg: No edema.  Lymphadenopathy:     Cervical: No cervical adenopathy.  Neurological:     Mental Status: She is alert and oriented to person, place, and time.     Cranial Nerves: No cranial nerve deficit, dysarthria or facial asymmetry.  Psychiatric:        Mood and Affect: Mood normal.        Behavior: Behavior normal.    BP 118/64 (BP Location: Left Arm, Patient Position: Sitting, Cuff Size: Normal)   Pulse 81   Temp 97.7 F (36.5 C) (Temporal)   Ht '5\' 7"'$  (1.702 m)   Wt 183 lb (83 kg)   SpO2 94%  BMI 28.66 kg/m  Wt Readings from Last 3 Encounters:  01/10/21 183 lb (83 kg)  12/09/20 180 lb 6.4 oz (81.8 kg)  08/22/20 179 lb 9.6 oz (81.5 kg)     Health Maintenance Due  Topic Date Due   MAMMOGRAM  01/03/2000   PAP SMEAR-Modifier  01/09/2019   Fecal DNA (Cologuard)  10/03/2020    There are no preventive care reminders to display for this patient.  Lab Results  Component Value Date   TSH 1.88 12/09/2020   Lab Results  Component Value Date   WBC 8.6 12/09/2020   HGB 13.8 12/09/2020   HCT 41.4 12/09/2020   MCV 89.8 12/09/2020   PLT 252.0 12/09/2020   Lab Results  Component Value Date   NA 137 12/09/2020   K 3.9 12/09/2020   CO2 26 12/09/2020    GLUCOSE 88 12/09/2020   BUN 13 12/09/2020   CREATININE 1.02 12/09/2020   BILITOT 0.4 12/09/2020   ALKPHOS 77 12/09/2020   AST 17 12/09/2020   ALT 16 12/09/2020   PROT 7.6 12/09/2020   ALBUMIN 4.3 12/09/2020   CALCIUM 9.3 12/09/2020   GFR 55.57 (L) 12/09/2020   Lab Results  Component Value Date   CHOL 207 (H) 08/30/2019   Lab Results  Component Value Date   HDL 63.70 08/30/2019   Lab Results  Component Value Date   LDLCALC 126 (H) 08/30/2019   Lab Results  Component Value Date   TRIG 87.0 08/30/2019   Lab Results  Component Value Date   CHOLHDL 3 08/30/2019   No results found for: HGBA1C    Assessment & Plan:   Problem List Items Addressed This Visit       Respiratory   Allergic rhinitis - Primary   Relevant Medications   mometasone (NASONEX) 50 MCG/ACT nasal spray     Other   Elevated BP without diagnosis of hypertension   Other Visit Diagnoses     Elevated LDL cholesterol level       Relevant Orders   Lipid panel       Meds ordered this encounter  Medications   mometasone (NASONEX) 50 MCG/ACT nasal spray    Sig: Use one spray in each nostril daily.    Dispense:  1 each    Refill:  12    Follow-up: Return in about 8 weeks (around 03/07/2021).   Will return fasting for above ordered lipid profile.  Continue to check blood pressure periodically.  Given pends In-Exsufflator trial of at least a few weeks. Libby Maw, MD

## 2021-01-10 NOTE — Telephone Encounter (Signed)
Pt was here for appt 9/16 and sched a follow up appt for nov. Does she still need the TOC appt for Oct ? (Pt was very confused about this)

## 2021-01-11 ENCOUNTER — Telehealth: Payer: Self-pay

## 2021-01-11 NOTE — Telephone Encounter (Signed)
Lft VM to schedule an AWV.  Dm/cma

## 2021-01-14 ENCOUNTER — Other Ambulatory Visit: Payer: Self-pay

## 2021-01-14 ENCOUNTER — Other Ambulatory Visit (INDEPENDENT_AMBULATORY_CARE_PROVIDER_SITE_OTHER): Payer: Medicare HMO

## 2021-01-14 DIAGNOSIS — E7800 Pure hypercholesterolemia, unspecified: Secondary | ICD-10-CM | POA: Diagnosis not present

## 2021-01-14 LAB — LIPID PANEL
Cholesterol: 239 mg/dL — ABNORMAL HIGH (ref 0–200)
HDL: 63.5 mg/dL (ref >39.00)
LDL Cholesterol: 154 mg/dL — ABNORMAL HIGH (ref 0–99)
NonHDL: 175.19
Total CHOL/HDL Ratio: 4
Triglycerides: 107 mg/dL (ref 0.0–149.0)
VLDL: 21.4 mg/dL (ref 0.0–40.0)

## 2021-01-14 NOTE — Telephone Encounter (Signed)
Please advise 

## 2021-01-14 NOTE — Progress Notes (Signed)
Ldl cholesterol is elevated. Her good cholesterol levels are good. Because she smokes, she has an elevated risk for having a vascular event in the next 10 years. I am recommending that she take a statin. Please let me know.   The 10-year ASCVD risk score (Arnett DK, et al., 2019) is: 14.5%   Values used to calculate the score:     Age: 70 years     Sex: Female     Is Non-Hispanic African American: No     Diabetic: No     Tobacco smoker: Yes     Systolic Blood Pressure: 797 mmHg     Is BP treated: No     HDL Cholesterol: 63.5 mg/dL     Total Cholesterol: 239 mg/dL

## 2021-01-15 NOTE — Telephone Encounter (Signed)
October appointment canceled. Patient aware and will come in for follow up appointment.

## 2021-01-27 ENCOUNTER — Other Ambulatory Visit: Payer: Self-pay | Admitting: *Deleted

## 2021-01-27 DIAGNOSIS — F17210 Nicotine dependence, cigarettes, uncomplicated: Secondary | ICD-10-CM

## 2021-01-27 DIAGNOSIS — Z87891 Personal history of nicotine dependence: Secondary | ICD-10-CM

## 2021-01-31 ENCOUNTER — Other Ambulatory Visit: Payer: Self-pay

## 2021-01-31 ENCOUNTER — Ambulatory Visit (INDEPENDENT_AMBULATORY_CARE_PROVIDER_SITE_OTHER)
Admission: RE | Admit: 2021-01-31 | Discharge: 2021-01-31 | Disposition: A | Discharge status: Home / Self Care | Payer: Medicare HMO | Source: Ambulatory Visit | Attending: Family Medicine | Admitting: Family Medicine

## 2021-01-31 ENCOUNTER — Encounter: Payer: Medicare HMO | Admitting: Family Medicine

## 2021-01-31 DIAGNOSIS — Z87891 Personal history of nicotine dependence: Secondary | ICD-10-CM

## 2021-01-31 DIAGNOSIS — F17210 Nicotine dependence, cigarettes, uncomplicated: Secondary | ICD-10-CM | POA: Diagnosis not present

## 2021-01-31 DIAGNOSIS — R69 Illness, unspecified: Secondary | ICD-10-CM | POA: Diagnosis not present

## 2021-02-03 ENCOUNTER — Telehealth: Payer: Self-pay | Admitting: Family Medicine

## 2021-02-03 NOTE — Telephone Encounter (Signed)
Please advise message below patient not sure if she can take [facility] with current medications. Please advise.

## 2021-02-06 NOTE — Telephone Encounter (Signed)
Patient notified VIA phone. No further questions. Dm/cma  

## 2021-02-18 ENCOUNTER — Telehealth: Payer: Self-pay | Admitting: Family Medicine

## 2021-02-18 NOTE — Telephone Encounter (Signed)
I attempted to leave message for patient to call back and schedule Medicare Annual Wellness Visit (AWV) in office. No voice mail.  If not able to come in office, please offer to do virtually or by telephone.  Left office number and my jabber [phone].  Last AWV:: 01/18/2019  Please schedule at anytime with Nurse Health Advisor.

## 2021-02-21 ENCOUNTER — Other Ambulatory Visit: Payer: Self-pay | Admitting: Acute Care

## 2021-02-21 DIAGNOSIS — F17210 Nicotine dependence, cigarettes, uncomplicated: Secondary | ICD-10-CM

## 2021-02-21 DIAGNOSIS — Z87891 Personal history of nicotine dependence: Secondary | ICD-10-CM

## 2021-02-25 ENCOUNTER — Encounter: Payer: Self-pay | Admitting: Family Medicine

## 2021-02-27 DIAGNOSIS — H26491 Other secondary cataract, right eye: Secondary | ICD-10-CM | POA: Diagnosis not present

## 2021-02-27 DIAGNOSIS — H4720 Unspecified optic atrophy: Secondary | ICD-10-CM | POA: Diagnosis not present

## 2021-03-11 ENCOUNTER — Ambulatory Visit (INDEPENDENT_AMBULATORY_CARE_PROVIDER_SITE_OTHER): Payer: Medicare HMO | Admitting: Family Medicine

## 2021-03-11 ENCOUNTER — Encounter: Payer: Self-pay | Admitting: Family Medicine

## 2021-03-11 ENCOUNTER — Telehealth: Payer: Self-pay | Admitting: Family Medicine

## 2021-03-11 ENCOUNTER — Other Ambulatory Visit: Payer: Self-pay

## 2021-03-11 VITALS — BP 124/72 | HR 75 | Temp 97.4°F | Ht 67.0 in | Wt 181.0 lb

## 2021-03-11 DIAGNOSIS — J309 Allergic rhinitis, unspecified: Secondary | ICD-10-CM

## 2021-03-11 DIAGNOSIS — H8309 Labyrinthitis, unspecified ear: Secondary | ICD-10-CM | POA: Diagnosis not present

## 2021-03-11 DIAGNOSIS — R69 Illness, unspecified: Secondary | ICD-10-CM | POA: Diagnosis not present

## 2021-03-11 DIAGNOSIS — F17210 Nicotine dependence, cigarettes, uncomplicated: Secondary | ICD-10-CM | POA: Diagnosis not present

## 2021-03-11 MED ORDER — MECLIZINE HCL 25 MG PO TABS: 25.0000 mg | ORAL_TABLET | Freq: Three times a day (TID) | ORAL | 0 refills | Status: DC | PRN

## 2021-03-11 MED ORDER — FLUTICASONE PROPIONATE 50 MCG/ACT NA SUSP: 2.0000 | Freq: Every day | NASAL | 6 refills | Status: DC

## 2021-03-11 NOTE — Telephone Encounter (Signed)
Pt didn't know when she should schedule for her next follow up appt, when is she do?

## 2021-03-11 NOTE — Telephone Encounter (Signed)
She doesn't have a follow up he said if she does not get better within a few weeks to give [place] a call I believe he's going to refer her somewhere.

## 2021-03-11 NOTE — Progress Notes (Signed)
Established Patient Office Visit  Subjective:  Patient ID: [name], female    DOB: 06/23/1950  Age: 70 y.o. MRN: [id number]  CC:  Chief Complaint  Patient presents with   Follow-up    8 week follow up on dizziness. Per patient still dizzy at times.     HPI [employer] presents for follow-up of ongoing issues with sneezing rhinorrhea postnasal drip.  Could not afford the Nasonex.  She has been experiencing dizzy spells described as a movement that is not occurring.  Denies lightheadedness.  Denies headache.  She does have a distant history of migraines and feels like she did after her migraines years ago when the dizziness stops.  She does have some light sensitivity and nausea with these spells.  Past Medical History:  Diagnosis Date   Abdominal pain, epigastric    Abdominal pain, epigastric    Abdominal pain, right lower quadrant    Abnormal weight gain    Abnormality of gait    Anxiety state, unspecified    Asymptomatic varicose veins    Blood vessel replaced by other means    Cramp of limb    Disorder of bone and cartilage, unspecified    Dizziness and giddiness    Edema    Encounter for long-term (current) use of other medications    Herpes zoster without mention of complication    Herpes zoster without mention of complication    Hypersomnia, unspecified    Insomnia, unspecified    Long term (current) use of anticoagulants    Lumbago    Lumbago    Muscle weakness (generalized)    Myalgia and myositis, unspecified    Obesity, unspecified    Optic neuritis    Other abnormal blood chemistry    Other and unspecified hyperlipidemia    Other cataract    Pain in joint, site unspecified    Pain in limb    Pain in limb    Palpitations    Phlebitis and thrombophlebitis of lower extremities, unspecified    Phlebitis and thrombophlebitis of other deep vessels of lower extremities    Rash and other nonspecific skin eruption    Spasm of muscle    Syncope and  collapse    Tobacco use disorder    Unspecified essential hypertension    Unspecified hypothyroidism    Unspecified sinusitis (chronic)    Unspecified tinnitus     Past Surgical History:  Procedure Laterality Date   CATARACT EXTRACTION, BILATERAL     EYE SURGERY     zag laser procedure.   FINGER SURGERY Left 06/18/2020   [place] FILTER IMPLANT  2008   DR [facility]   HIP SURGERY LEFT  03/2010   DR [facility]   HIP SURGERY RIGHT  06/2010   DR [place]   LEFT SHOULDER  2012   DR [facility]    LOWER BACK  05/26/2007   DR JEFF [facility]   RIGHT BREAST BIOPSY  1986   RIGHT KNEE  1971    Family History  Problem Relation Age of Onset   Stroke Mother    Stroke Father    Heart disease Father    Stroke Brother     Social History   Socioeconomic History   Marital status: Widowed    Spouse name: [name]   Number of children: 0   Years of education: college   Highest education level: Not on file  Occupational History    Employer: [employer]  Comment: answers phones  Tobacco Use   Smoking status: Heavy Smoker    Packs/day: 1.50    Years: 52.00    Pack years: 78.00    Types: Cigarettes   Smokeless tobacco: Never   Tobacco comments:    2 packs daily-01/24/2020  Vaping Use   Vaping Use: Never used  Substance and Sexual Activity   Alcohol use: Yes   Drug use: No   Sexual activity: Not Currently    Partners: Male    Comment: husband  Other Topics Concern   Not on file  Social History Narrative   Patient  Lives at home wit her husband [name]) . Patient still works.college education.   Right handed.   Caffiene -None   Social Determinants of Radio broadcast assistant Strain: Not on file  Food Insecurity: Not on file  Transportation Needs: Not on file  Physical Activity: Not on file  Stress: Not on file  Social Connections: Not on file  Intimate Partner Violence: Not on file    Outpatient Medications Prior to Visit  Medication Sig  Dispense Refill   aspirin 325 MG tablet Take 325 mg by mouth daily.     Cholecalciferol (VITAMIN D3 PO) Take 1 tablet by mouth daily.     cyclobenzaprine (FLEXERIL) 5 MG tablet Take 1 tablet (5 mg total) by mouth 3 (three) times daily as needed for muscle spasms. 30 tablet 1   EPINEPHRINE 0.3 mg/0.3 mL IJ SOAJ injection INJECT 0.3 MLS (0.3 MG TOTAL) INTO THE MUSCLE AS NEEDED FOR ANAPHYLAXIS. 2 each 1   Polyethyl Glycol-Propyl Glycol (SYSTANE PRESERVATIVE FREE OP) Apply to eye.     TIROSINT 75 MCG CAPS Take 1 capsule (75 mcg total) by mouth daily. 90 capsule 3   triamcinolone ointment (KENALOG) 0.1 % triamcinolone acetonide 0.1 % topical ointment  APPLY A THIN LAYER TO THE AFFECTED vulvar AREA(S) BY TOPICAL ROUTE daily     mometasone (NASONEX) 50 MCG/ACT nasal spray Use one spray in each nostril daily. 1 each 12   Zinc 100 MG TABS Take by mouth.     cetirizine (ZYRTEC) 10 MG tablet Take 1 tablet (10 mg total) by mouth daily. (Patient not taking: Reported on 01/10/2021) 30 tablet 11   No facility-administered medications prior to visit.    Allergies  Allergen Reactions   Cortizone-10 [Hydrocortisone] Hives   Other Anaphylaxis    Beans,carbonation, dust and mold,floride,fried foods, household cleaner, melon,mushrooms,peppers,preservatives,rasisns,spicey,spinach,trees,milk products,shellfish   Shellfish Allergy Other (See Comments) and Anaphylaxis    Death   Synthroid [Levothyroxine Sodium] Anaphylaxis    Internal shaking    Azithromycin Hives   Betadine [Povidone Iodine]     Aspiration    Calcium Channel Blockers     Asthma   Codeine     Flu-like symptoms    Duloxetine Itching and Nausea Only    Dry mouth   Fluoride Preparations Other (See Comments)    Blisters   Iodine Other (See Comments)    Aspiration    Levaquin [Levofloxacin In D5w]    Lyrica [Pregabalin] Other (See Comments)    Make her very mean and angery   Novocain [Procaine] Other (See Comments)    Heart Palpations    Peanuts [Peanut Oil] Swelling    **Allergy to ALL NUTS** Face Swelling    Penicillins    Pfizerpen [Penicillin G]     Patient would prefer not take any pfizer brand products   Pollen Extract Other (See Comments)    Hemorrhage  Prozac [Fluoxetine Hcl]    Wellbutrin [Bupropion] Hives    After 1 pill fist size bumps appeared   Lanolin Rash   Molds & Smuts Other (See Comments)   Mushroom Extract Complex Nausea And Vomiting    ROS Review of Systems  Constitutional:  Negative for chills, diaphoresis, fatigue, fever and unexpected weight change.  HENT:  Positive for congestion, postnasal drip, rhinorrhea and sneezing.   Respiratory: Negative.    Cardiovascular: Negative.   Gastrointestinal: Negative.   Genitourinary: Negative.   Musculoskeletal: Negative.   Neurological:  Positive for dizziness. Negative for speech difficulty, weakness, light-headedness and headaches.  Psychiatric/Behavioral:  Positive for sleep disturbance.      Objective:    Physical Exam Vitals and nursing note reviewed.  Constitutional:      General: She is not in acute distress.    Appearance: Normal appearance. She is not ill-appearing, toxic-appearing or diaphoretic.  HENT:     Head: Normocephalic and atraumatic.     Right Ear: Tympanic membrane, ear canal and external ear normal.     Left Ear: Tympanic membrane, ear canal and external ear normal.     Mouth/Throat:     Mouth: Mucous membranes are moist.     Pharynx: Oropharynx is clear. No oropharyngeal exudate or posterior oropharyngeal erythema.  Eyes:     General: No visual field deficit.       Right eye: No discharge.        Left eye: No discharge.     Extraocular Movements: Extraocular movements intact.     Conjunctiva/sclera: Conjunctivae normal.     Pupils: Pupils are equal, round, and reactive to light.  Neck:     Vascular: No carotid bruit.  Cardiovascular:     Rate and Rhythm: Normal rate and regular rhythm.  Pulmonary:     Effort:  Pulmonary effort is normal.     Breath sounds: Normal breath sounds.  Abdominal:     General: Bowel sounds are normal.  Musculoskeletal:     Cervical back: No rigidity or tenderness.  Lymphadenopathy:     Cervical: No cervical adenopathy.  Skin:    General: Skin is warm and dry.  Neurological:     General: No focal deficit present.     Mental Status: She is alert and oriented to person, place, and time.     Cranial Nerves: No cranial nerve deficit, dysarthria or facial asymmetry.     Motor: No weakness.     Coordination: Romberg sign negative.  Psychiatric:        Mood and Affect: Mood normal.        Behavior: Behavior normal.    BP 124/72 (BP Location: Left Arm, Patient Position: Sitting, Cuff Size: Normal)   Pulse 75   Temp (!) 97.4 F (36.3 C) (Temporal)   Ht 5\' 7"  (1.702 m)   Wt 181 lb (82.1 kg)   SpO2 98%   BMI 28.35 kg/m  Wt Readings from Last 3 Encounters:  03/11/21 181 lb (82.1 kg)  01/10/21 183 lb (83 kg)  12/09/20 180 lb 6.4 oz (81.8 kg)     Health Maintenance Due  Topic Date Due   MAMMOGRAM  01/03/2000   Pneumonia Vaccine 85+ Years old (3 - PPSV23 if available, else PCV20) 04/27/2017   PAP SMEAR-Modifier  01/09/2019   Fecal DNA (Cologuard)  10/03/2020    There are no preventive care reminders to display for this patient.  Lab Results  Component Value Date   TSH 1.88  12/09/2020   Lab Results  Component Value Date   WBC 8.6 12/09/2020   HGB 13.8 12/09/2020   HCT 41.4 12/09/2020   MCV 89.8 12/09/2020   PLT 252.0 12/09/2020   Lab Results  Component Value Date   NA 137 12/09/2020   K 3.9 12/09/2020   CO2 26 12/09/2020   GLUCOSE 88 12/09/2020   BUN 13 12/09/2020   CREATININE 1.02 12/09/2020   BILITOT 0.4 12/09/2020   ALKPHOS 77 12/09/2020   AST 17 12/09/2020   ALT 16 12/09/2020   PROT 7.6 12/09/2020   ALBUMIN 4.3 12/09/2020   CALCIUM 9.3 12/09/2020   GFR 55.57 (L) 12/09/2020   Lab Results  Component Value Date   CHOL 239 (H)  01/14/2021   Lab Results  Component Value Date   HDL 63.50 01/14/2021   Lab Results  Component Value Date   LDLCALC 154 (H) 01/14/2021   Lab Results  Component Value Date   TRIG 107.0 01/14/2021   Lab Results  Component Value Date   CHOLHDL 4 01/14/2021   No results found for: HGBA1C    Assessment & Plan:   Problem List Items Addressed This Visit       Respiratory   Allergic rhinitis - Primary   Relevant Medications   fluticasone (FLONASE) 50 MCG/ACT nasal spray     Nervous and Auditory   Labyrinthitis   Relevant Medications   meclizine (ANTIVERT) 25 MG tablet     Other   Cigarette nicotine dependence without complication    Meds ordered this encounter  Medications   fluticasone (FLONASE) 50 MCG/ACT nasal spray    Sig: Place 2 sprays into both nostrils daily.    Dispense:  16 g    Refill:  6   meclizine (ANTIVERT) 25 MG tablet    Sig: Take 1 tablet (25 mg total) by mouth 3 (three) times daily as needed for dizziness.    Dispense:  30 tablet    Refill:  0    Follow-up: No follow-ups on file.   Will try Antivert for dizziness.  Drowsy precautions given.  We will try Flonase and hopefully will be less expensive.  She will ask the pharmacist for cheaper over-the-counter nasal steroid if Flonase is too expensive.  We will consider her thought vestibular migraine if this does not improve. Libby Maw, MD

## 2021-03-13 ENCOUNTER — Telehealth: Payer: Self-pay | Admitting: Family Medicine

## 2021-03-13 NOTE — Telephone Encounter (Signed)
Called patient to inform that it is okay to cut meds in half. No answer LM on detailed VM asked patient to call back with any questions.

## 2021-03-26 DIAGNOSIS — J3089 Other allergic rhinitis: Secondary | ICD-10-CM | POA: Diagnosis not present

## 2021-04-09 ENCOUNTER — Telehealth: Payer: Self-pay

## 2021-04-09 ENCOUNTER — Ambulatory Visit: Payer: Medicare HMO

## 2021-04-09 NOTE — Telephone Encounter (Signed)
Called patient for 1245 Mwv , no answer x3 , patient may reschedule for next available appointment.  L.Akito Boomhower,LPN

## 2021-04-29 ENCOUNTER — Telehealth: Payer: Self-pay | Admitting: Family Medicine

## 2021-04-29 DIAGNOSIS — E039 Hypothyroidism, unspecified: Secondary | ICD-10-CM

## 2021-04-29 MED ORDER — TIROSINT 75 MCG PO CAPS
1.0000 | ORAL_CAPSULE | Freq: Every day | ORAL | 3 refills | Status: DC
Start: 2021-04-29 — End: 2021-10-14

## 2021-04-29 NOTE — Telephone Encounter (Signed)
Refill sent in

## 2021-05-20 DIAGNOSIS — Z01419 Encounter for gynecological examination (general) (routine) without abnormal findings: Secondary | ICD-10-CM | POA: Diagnosis not present

## 2021-05-27 ENCOUNTER — Telehealth: Payer: Self-pay | Admitting: Family Medicine

## 2021-05-27 NOTE — Telephone Encounter (Signed)
Please advise message below  °

## 2021-05-27 NOTE — Telephone Encounter (Signed)
Pt  is not having any improvement with her runny nose, getting dizzy after bending over more than 2 times. She also feels she can not use her fluticasone (FLONASE) 50 MCG/ACT nasal spray [[id number]]  more than 2 days straight or she will get a headache. Should she stay on course with her scripts Flonase and Antivert or should she make changes. Please advise pt at [phone].

## 2021-05-28 NOTE — Telephone Encounter (Signed)
Spoke with patient who verbally understood message below. Per patient she will call back for recheck.

## 2021-06-27 ENCOUNTER — Encounter: Payer: Self-pay | Admitting: Family Medicine

## 2021-07-09 DIAGNOSIS — N950 Postmenopausal bleeding: Secondary | ICD-10-CM | POA: Diagnosis not present

## 2021-07-09 DIAGNOSIS — L900 Lichen sclerosus et atrophicus: Secondary | ICD-10-CM | POA: Diagnosis not present

## 2021-07-16 ENCOUNTER — Other Ambulatory Visit: Payer: Self-pay | Admitting: Family Medicine

## 2021-07-16 DIAGNOSIS — E039 Hypothyroidism, unspecified: Secondary | ICD-10-CM

## 2021-08-04 ENCOUNTER — Telehealth: Payer: Self-pay | Admitting: Family Medicine

## 2021-08-04 NOTE — Telephone Encounter (Signed)
Left message for patient to call back and schedule Medicare Annual Wellness Visit (AWV).   Please offer to do virtually or by telephone.  Left office number and my jabber #[phone].  Last AWV:01/18/2019  Please schedule at anytime with Nurse Health Advisor.   

## 2021-08-19 ENCOUNTER — Encounter: Payer: Self-pay | Admitting: Family Medicine

## 2021-08-19 ENCOUNTER — Ambulatory Visit (INDEPENDENT_AMBULATORY_CARE_PROVIDER_SITE_OTHER): Payer: Medicare HMO | Admitting: Family Medicine

## 2021-08-19 VITALS — BP 134/68 | HR 78 | Temp 97.9°F | Ht 67.0 in | Wt 185.4 lb

## 2021-08-19 DIAGNOSIS — J309 Allergic rhinitis, unspecified: Secondary | ICD-10-CM | POA: Diagnosis not present

## 2021-08-19 DIAGNOSIS — E7800 Pure hypercholesterolemia, unspecified: Secondary | ICD-10-CM

## 2021-08-19 DIAGNOSIS — H8309 Labyrinthitis, unspecified ear: Secondary | ICD-10-CM

## 2021-08-19 DIAGNOSIS — R69 Illness, unspecified: Secondary | ICD-10-CM | POA: Diagnosis not present

## 2021-08-19 DIAGNOSIS — F17210 Nicotine dependence, cigarettes, uncomplicated: Secondary | ICD-10-CM

## 2021-08-19 MED ORDER — MECLIZINE HCL 25 MG PO TABS
25.0000 mg | ORAL_TABLET | Freq: Three times a day (TID) | ORAL | 1 refills | Status: DC | PRN
Start: 2021-08-19 — End: 2021-09-03

## 2021-08-19 MED ORDER — MOMETASONE FUROATE 50 MCG/ACT NA SUSP: 2.0000 | Freq: Every day | NASAL | 12 refills | Status: AC

## 2021-08-19 NOTE — Progress Notes (Signed)
? ?Established Patient Office Visit ? ?Subjective   ?Patient ID: [name], female    DOB: 1951/01/24  Age: 71 y.o. MRN: [id number] ? ?Chief Complaint  ?Patient presents with  ? Dizziness  ?  Follow up on dizziness, per patient have not gotten better.   ? ? ?Dizziness ?Associated symptoms include congestion and coughing. Pertinent negatives include no abdominal pain, chest pain, chills, diaphoresis, myalgias or weakness.  follow-up of dizziness/lightheadedness and allergy rhinitis.  She describes dizziness as a lightheaded feeling when she stands or moves her head but there is also a sensation of movement that is not present.  She had tried meclizine but it caused drowsiness and she discontinued it.  She had tried Flonase but experienced headaches that have abated since she discontinued it.  Continues to smoke heavily. ? ? ? ?Review of Systems  ?Constitutional:  Negative for chills, diaphoresis, malaise/fatigue and weight loss.  ?HENT:  Positive for congestion.   ?Eyes: Negative.  Negative for blurred vision and double vision.  ?Respiratory:  Positive for cough. Negative for shortness of breath and wheezing.   ?Cardiovascular:  Negative for chest pain.  ?Gastrointestinal:  Negative for abdominal pain.  ?Genitourinary: Negative.   ?Musculoskeletal:  Negative for falls and myalgias.  ?Neurological:  Positive for dizziness. Negative for speech change, loss of consciousness and weakness.  ?Psychiatric/Behavioral: Negative.    ? ?  ?Objective:  ?  ? ?BP 134/68 (BP Location: Right Arm, Patient Position: Sitting, Cuff Size: Normal)   Pulse 78   Temp 97.9 ?F (36.6 ?C) (Temporal)   Ht '5\' 7"'$  (1.702 m)   Wt 185 lb 6.4 oz (84.1 kg)   SpO2 95%   BMI 29.04 kg/m?  ? ? ?Physical Exam ?Constitutional:   ?   General: She is not in acute distress. ?   Appearance: Normal appearance. She is not ill-appearing, toxic-appearing or diaphoretic.  ?HENT:  ?   Head: Normocephalic and atraumatic.  ?   Right Ear: External ear normal.   ?   Left Ear: External ear normal.  ?   Mouth/Throat:  ?   Mouth: Mucous membranes are moist.  ?   Pharynx: Oropharynx is clear. No oropharyngeal exudate or posterior oropharyngeal erythema.  ?Eyes:  ?   General: No scleral icterus.    ?   Right eye: No discharge.     ?   Left eye: No discharge.  ?   Extraocular Movements: Extraocular movements intact.  ?   Conjunctiva/sclera: Conjunctivae normal.  ?   Pupils: Pupils are equal, round, and reactive to light.  ?Cardiovascular:  ?   Rate and Rhythm: Normal rate and regular rhythm.  ?Pulmonary:  ?   Effort: Pulmonary effort is normal. No respiratory distress.  ?   Breath sounds: Normal breath sounds. No stridor. No wheezing, rhonchi or rales.  ?Abdominal:  ?   General: Bowel sounds are normal.  ?   Tenderness: There is no abdominal tenderness. There is no guarding.  ?Musculoskeletal:  ?   Cervical back: No rigidity or tenderness.  ?Skin: ?   General: Skin is warm and dry.  ?Neurological:  ?   Mental Status: She is alert and oriented to person, place, and time.  ?   Cranial Nerves: No cranial nerve deficit.  ?Psychiatric:     ?   Mood and Affect: Mood normal.     ?   Behavior: Behavior normal.  ? ? ? ?No results found for any visits on 08/19/21. ? ? ? ?  The 10-year ASCVD risk score (Arnett DK, et al., 2019) is: 18.2% ? ?  ?Assessment & Plan:  ? ?Problem List Items Addressed This Visit   ? ?  ? Respiratory  ? Allergic rhinitis - Primary  ? Relevant Medications  ? mometasone (NASONEX) 50 MCG/ACT nasal spray  ?  ? Nervous and Auditory  ? Labyrinthitis  ? Relevant Medications  ? meclizine (ANTIVERT) 25 MG tablet  ?  ? Other  ? Cigarette nicotine dependence without complication  ? Elevated LDL cholesterol level  ? ? ?Return in about 4 weeks (around 09/16/2021).  ?Will restart meclizine.  Advised that with regular use the drowsiness side effect tends to abate.  It could help allergy symptoms as well.  We will try Nasonex.  Epley maneuvers were discussed.  She was given  information on how to perform them at home.  Declines PT for now.  We will discuss elevated LDL next visit again.  Will consider neurology referral if dizziness/lightheadedness symptoms do not improve.  Advised to stop smoking. ? ?Libby Maw, MD ? ?

## 2021-08-20 ENCOUNTER — Telehealth: Payer: Self-pay | Admitting: Family Medicine

## 2021-08-20 NOTE — Telephone Encounter (Signed)
Please advise message below  °

## 2021-08-20 NOTE — Telephone Encounter (Signed)
Pt called and said that she took meclizine (ANTIVERT) 25 MG tablet around 7pm and she fell asleep right after fully clothed on the couch and didn't wake up until this morning 9:30 when her dog woke her up, she said her head didn't even clear up for her to be able to call us until after 12. She said this is the same tablet she was prescribed last month and it did the same thing. She said Dr Ethelene Hal wanted her to take for a week and the sleepiness should go away but she said she cant do this for a week. Please advise. She said she feels drugged when taking it. Please call back at [phone] ?

## 2021-08-21 DIAGNOSIS — R21 Rash and other nonspecific skin eruption: Secondary | ICD-10-CM | POA: Diagnosis not present

## 2021-08-21 DIAGNOSIS — W57XXXA Bitten or stung by nonvenomous insect and other nonvenomous arthropods, initial encounter: Secondary | ICD-10-CM | POA: Diagnosis not present

## 2021-08-21 NOTE — Telephone Encounter (Signed)
Patient notified VIA phone. Dm/cma  

## 2021-08-22 NOTE — Telephone Encounter (Addendum)
Patient had pain and itching on back and she went to urgent care yesterday and they pulled 2 ticks that had been there for about a month. They put her on doxycycline and said that it might help take care of the dizziness. She wanted to apologize to Dr Ethelene Hal because she forgot to bring it up at her appt and it would have got caught sooner if she had. They just wanted her to follow up with PCP in 4 weeks and she already has appt then ?

## 2021-08-22 NOTE — Telephone Encounter (Signed)
Message below FYI.

## 2021-09-03 ENCOUNTER — Ambulatory Visit (INDEPENDENT_AMBULATORY_CARE_PROVIDER_SITE_OTHER): Payer: Medicare HMO | Admitting: Family Medicine

## 2021-09-03 ENCOUNTER — Encounter: Payer: Self-pay | Admitting: Family Medicine

## 2021-09-03 DIAGNOSIS — E7800 Pure hypercholesterolemia, unspecified: Secondary | ICD-10-CM | POA: Diagnosis not present

## 2021-09-03 DIAGNOSIS — S20462A Insect bite (nonvenomous) of left back wall of thorax, initial encounter: Secondary | ICD-10-CM

## 2021-09-03 DIAGNOSIS — R69 Illness, unspecified: Secondary | ICD-10-CM | POA: Diagnosis not present

## 2021-09-03 DIAGNOSIS — Z9189 Other specified personal risk factors, not elsewhere classified: Secondary | ICD-10-CM

## 2021-09-03 DIAGNOSIS — W57XXXA Bitten or stung by nonvenomous insect and other nonvenomous arthropods, initial encounter: Secondary | ICD-10-CM

## 2021-09-03 DIAGNOSIS — H8309 Labyrinthitis, unspecified ear: Secondary | ICD-10-CM

## 2021-09-03 DIAGNOSIS — F17210 Nicotine dependence, cigarettes, uncomplicated: Secondary | ICD-10-CM | POA: Diagnosis not present

## 2021-09-03 NOTE — Progress Notes (Addendum)
Established Patient Office Visit  Subjective   Patient ID: [name], female    DOB: May 22, 1950  Age: 71 y.o. MRN: [id number]  Chief Complaint  Patient presents with   Follow-up    Follow up from urgent care seen for tick bite removed from back about 2 weeks ago. Have had soreness in neck not sure if this is from tick bite, still dizzy at time.     HPI for follow-up of 2 tick bites to her left back area couple weeks ago.  Was seen at urgent care and prescribed doxycycline.  Bites are scabbed over to itch a bit.   Dizziness persist.  Has not had a chance to start the Epley maneuvers.  Elevated ASCVD risk score    Review of Systems  Constitutional:  Negative for chills, diaphoresis, malaise/fatigue and weight loss.  HENT: Negative.    Eyes: Negative.  Negative for blurred vision and double vision.  Respiratory:  Positive for cough.   Cardiovascular:  Negative for chest pain.  Gastrointestinal:  Negative for abdominal pain.  Genitourinary: Negative.   Musculoskeletal:  Negative for falls and myalgias.  Neurological:  Negative for speech change, loss of consciousness, weakness and headaches.  Psychiatric/Behavioral: Negative.       Objective:     BP 124/70 (BP Location: Left Arm, Patient Position: Sitting, Cuff Size: Large)   Pulse 85   Temp (!) 97.1 F (36.2 C) (Temporal)   Ht '5\' 7"'$  (1.702 m)   Wt 181 lb 9.6 oz (82.4 kg)   SpO2 96%   BMI 28.44 kg/m    Physical Exam Constitutional:      General: She is not in acute distress.    Appearance: Normal appearance. She is not ill-appearing, toxic-appearing or diaphoretic.  HENT:     Head: Normocephalic and atraumatic.     Right Ear: External ear normal.     Left Ear: External ear normal.     Mouth/Throat:     Mouth: Mucous membranes are moist.     Pharynx: Oropharynx is clear. No oropharyngeal exudate or posterior oropharyngeal erythema.  Eyes:     General: No scleral icterus.       Right eye: No discharge.         Left eye: No discharge.     Extraocular Movements: Extraocular movements intact.     Conjunctiva/sclera: Conjunctivae normal.     Pupils: Pupils are equal, round, and reactive to light.  Cardiovascular:     Rate and Rhythm: Normal rate and regular rhythm.  Pulmonary:     Effort: Pulmonary effort is normal. No respiratory distress.     Breath sounds: Normal breath sounds.  Abdominal:     General: Bowel sounds are normal.  Musculoskeletal:     Cervical back: No rigidity or tenderness.  Skin:    General: Skin is warm and dry.       Neurological:     Mental Status: She is alert and oriented to person, place, and time.  Psychiatric:        Mood and Affect: Mood normal.        Behavior: Behavior normal.     No results found for any visits on 09/03/21.    The 10-year ASCVD risk score (Arnett DK, et al., 2019) is: 15.9%    Assessment & Plan:   Problem List Items Addressed This Visit       Nervous and Auditory   Labyrinthitis   Relevant Orders   Ambulatory referral  to ENT   Ambulatory referral to Physical Therapy     Musculoskeletal and Integument   Tick bite of left back wall of thorax     Other   Cigarette nicotine dependence without complication   Elevated LDL cholesterol level   At risk for cardiovascular event    Return in about 6 months (around 03/06/2022), or if symptoms worsen or fail to improve.  Discussed her elevated ASCVD risk score.  Offered and recommended a statin.  Patient declined.  She would like to try to quit smoking and lose weight.  Feels as though a statin killed her late husband.  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  She would like to see a neurologist for follow-up of her chronic bronchitis.  Advised smoking cessation.  Discussed Chantix.  She had tried it before believe she had experienced bad dreams.  She completed doxycycline for tick bites.  Given information on the  high cholesterol and managing the challenges quitting smoking.  Libby Maw, MD

## 2021-09-08 ENCOUNTER — Telehealth: Payer: Self-pay | Admitting: Family Medicine

## 2021-09-08 NOTE — Telephone Encounter (Signed)
Left message for patient to call back and schedule Medicare Annual Wellness Visit (AWV).   Please offer to do virtually or by telephone.  Left office number and my jabber #[phone].  Last AWV:01/18/2019  Please schedule at anytime with Nurse Health Advisor.   

## 2021-09-11 NOTE — Addendum Note (Signed)
Addended by: Abelino Derrick A on: 09/11/2021 01:07 PM   Modules accepted: Orders

## 2021-09-11 NOTE — Addendum Note (Signed)
Addended by: Jon Billings on: 09/11/2021 12:51 PM   Modules accepted: Orders

## 2021-09-16 ENCOUNTER — Ambulatory Visit (INDEPENDENT_AMBULATORY_CARE_PROVIDER_SITE_OTHER): Payer: Medicare HMO

## 2021-09-16 ENCOUNTER — Ambulatory Visit: Payer: Medicare HMO | Admitting: Family Medicine

## 2021-09-16 DIAGNOSIS — Z0000 Encounter for general adult medical examination without abnormal findings: Secondary | ICD-10-CM | POA: Diagnosis not present

## 2021-09-16 NOTE — Progress Notes (Signed)
Subjective:   [name] is a 70 y.o. female who presents for Medicare Annual (Subsequent) preventive examination.   I connected with [employer] today by telephone and verified that I am speaking with the correct person using two identifiers. Location patient: home Location provider: work Persons participating in the virtual visit: patient, provider.   I discussed the limitations, risks, security and privacy concerns of performing an evaluation and management service by telephone and the availability of in person appointments. I also discussed with the patient that there may be a patient responsible charge related to this service. The patient expressed understanding and verbally consented to this telephonic visit.    Interactive audio and video telecommunications were attempted between this provider and patient, however failed, due to patient having technical difficulties OR patient did not have access to video capability.  We continued and completed visit with audio only.    Review of Systems     Cardiac Risk Factors include: advanced age (>33mn, >>41women)     Objective:    Today's Vitals   There is no height or weight on file to calculate BMI.     09/16/2021    1:15 PM 01/18/2019    3:23 PM 12/25/2016   11:19 AM 07/01/2016    3:05 PM 02/26/2016    1:33 PM 12/31/2015    1:48 PM 08/07/2015    2:50 PM  Advanced Directives  Does Patient Have a Medical Advance Directive? Yes Yes No Yes Yes Yes No  Type of [place]amed[place]of [place] will [place] will  Healthcare Power of [place] will   Does patient want to make changes to medical advance directive?  No - Patient declined       Copy of [place] Chart? No - copy requested No - copy requested  Yes No - copy requested No - copy requested   Would patient like information on creating a  medical advance directive?       No - patient declined information    Current Medications (verified) Outpatient Encounter Medications as of 09/16/2021  Medication Sig   aspirin 325 MG tablet Take 325 mg by mouth daily.   Cholecalciferol (VITAMIN D3 PO) Take 1 tablet by mouth daily.   EPINEPHRINE 0.3 mg/0.3 mL IJ SOAJ injection INJECT 0.3 MLS (0.3 MG TOTAL) INTO THE MUSCLE AS NEEDED FOR ANAPHYLAXIS.   mometasone (NASONEX) 50 MCG/ACT nasal spray Place 2 sprays into the nose daily.   Polyethyl Glycol-Propyl Glycol (SYSTANE PRESERVATIVE FREE OP) Apply to eye.   TIROSINT 75 MCG CAPS Take 1 capsule (75 mcg total) by mouth daily.   triamcinolone ointment (KENALOG) 0.1 % triamcinolone acetonide 0.1 % topical ointment  APPLY A THIN LAYER TO THE AFFECTED vulvar AREA(S) BY TOPICAL ROUTE daily   meclizine (ANTIVERT) 25 MG tablet Take 1 tablet (25 mg total) by mouth 3 (three) times daily as needed for dizziness. (Patient not taking: Reported on 09/03/2021)   Zinc 100 MG TABS Take by mouth. (Patient not taking: Reported on 09/16/2021)   No facility-administered encounter medications on file as of 09/16/2021.    Allergies (verified) Cortizone-10 [hydrocortisone], Other, Shellfish allergy, Synthroid [levothyroxine sodium], Azithromycin, Betadine [povidone iodine], Calcium channel blockers, Codeine, Duloxetine, Fluoride preparations, Iodine, Levaquin [levofloxacin in d5w], Lyrica [pregabalin], Novocain [procaine], Peanuts [peanut oil], Penicillins, Pfizerpen [penicillin g], Pollen extract, Prozac [fluoxetine hcl], Wellbutrin [bupropion], Lanolin, Molds & smuts, and Mushroom extract  complex   History: Past Medical History:  Diagnosis Date   Abdominal pain, epigastric    Abdominal pain, epigastric    Abdominal pain, right lower quadrant    Abnormal weight gain    Abnormality of gait    Anxiety state, unspecified    Asymptomatic varicose veins    Blood vessel replaced by other means    Cramp of limb     Disorder of bone and cartilage, unspecified    Dizziness and giddiness    Edema    Encounter for long-term (current) use of other medications    Herpes zoster without mention of complication    Herpes zoster without mention of complication    Hypersomnia, unspecified    Insomnia, unspecified    Long term (current) use of anticoagulants    Lumbago    Lumbago    Muscle weakness (generalized)    Myalgia and myositis, unspecified    Obesity, unspecified    Optic neuritis    Other abnormal blood chemistry    Other and unspecified hyperlipidemia    Other cataract    Pain in joint, site unspecified    Pain in limb    Pain in limb    Palpitations    Phlebitis and thrombophlebitis of lower extremities, unspecified    Phlebitis and thrombophlebitis of other deep vessels of lower extremities    Rash and other nonspecific skin eruption    Spasm of muscle    Syncope and collapse    Tobacco use disorder    Unspecified essential hypertension    Unspecified hypothyroidism    Unspecified sinusitis (chronic)    Unspecified tinnitus    Past Surgical History:  Procedure Laterality Date   CATARACT EXTRACTION, BILATERAL     EYE SURGERY     zag laser procedure.   FINGER SURGERY Left 06/18/2020   GREENWOOD FILTER IMPLANT  2008   DR [facility]   HIP SURGERY LEFT  03/2010   DR [facility]   HIP SURGERY RIGHT  06/2010   DR [place]   LEFT SHOULDER  2012   DR [facility]    LOWER BACK  05/26/2007   DR JEFF BEANE   RIGHT BREAST BIOPSY  1986   RIGHT KNEE  1971   Family History  Problem Relation Age of Onset   Stroke Mother    Stroke Father    Heart disease Father    Stroke Brother    Social History   Socioeconomic History   Marital status: Widowed    Spouse name: [name]   Number of children: 0   Years of education: college   Highest education level: Not on file  Occupational History    Employer: [employer]    Comment: answers phones  Tobacco Use   Smoking  status: Heavy Smoker    Packs/day: 1.50    Years: 52.00    Pack years: 78.00    Types: Cigarettes   Smokeless tobacco: Never   Tobacco comments:    2 packs daily-01/24/2020  Vaping Use   Vaping Use: Never used  Substance and Sexual Activity   Alcohol use: Yes   Drug use: No   Sexual activity: Not Currently    Partners: Male    Comment: husband  Other Topics Concern   Not on file  Social History Narrative   Patient  Lives at home wit her husband [name]) . Patient still works.college education.   Right handed.   Caffiene -None   Social Determinants of Health  Financial Resource Strain: Low Risk    Difficulty of Paying Living Expenses: Not hard at all  Food Insecurity: No Food Insecurity   Worried About Charity fundraiser in the Last Year: Never true   Ran Out of Food in the Last Year: Never true  Transportation Needs: No Transportation Needs   Lack of Transportation (Medical): No   Lack of Transportation (Non-Medical): No  Physical Activity: Inactive   Days of Exercise per Week: 0 days   Minutes of Exercise per Session: 0 min  Stress: No Stress Concern Present   Feeling of Stress : Not at all  Social Connections: Moderately Isolated   Frequency of Communication with Friends and Family: Three times a week   Frequency of Social Gatherings with Friends and Family: Three times a week   Attends Religious Services: 1 to 4 times per year   Active Member of Clubs or Organizations: No   Attends Archivist Meetings: Never   Marital Status: Widowed    Tobacco Counseling Ready to quit: Not Answered Counseling given: Not Answered Tobacco comments: 2 packs daily-01/24/2020   Clinical Intake:  Pre-visit preparation completed: Yes  Pain : No/denies pain     Nutritional Risks: None Diabetes: No  How often do you need to have someone help you when you read instructions, pamphlets, or other written materials from your doctor or pharmacy?: 1 - Never What is  the last grade level you completed in school?: college  Diabetic?no   Interpreter Needed?: No  Information entered by :: L.Jaylene Arrowood,LPN   Activities of Daily Living    09/16/2021    1:16 PM  In your present state of health, do you have any difficulty performing the following activities:  Hearing? 0  Vision? 0  Difficulty concentrating or making decisions? 0  Walking or climbing stairs? 0  Dressing or bathing? 0  Doing errands, shopping? 0  Preparing Food and eating ? N  Using the Toilet? N  In the past six months, have you accidently leaked urine? N  Do you have problems with loss of bowel control? N  Managing your Medications? N  Managing your Finances? N  Housekeeping or managing your Housekeeping? N    Patient Care Team: Libby Maw, MD as PCP - General (Family Medicine) Mcarthur Rossetti, MD as Attending Physician (Orthopedic Surgery) Jacelyn Pi, MD as Consulting Physician (Endocrinology) Jerelyn Charles, MD as Consulting Physician (Gynecology)  Indicate any recent Medical Services you may have received from other than [facility] providers in the past year (date may be approximate).     Assessment:   This is a routine wellness examination for Fahima.  Hearing/Vision screen Vision Screening - Comments:: Annual eye exams   Dietary issues and exercise activities discussed: Current Exercise Habits: The patient does not participate in regular exercise at present, Exercise limited by: neurologic condition(s)   Goals Addressed             This Visit's Progress    Quit smoking / using tobacco   On track    Starting 02/26/16, I will attempt to quit smoking.        Depression Screen    09/16/2021    1:16 PM 09/16/2021    1:14 PM 09/03/2021   11:12 AM 08/19/2021   10:52 AM 03/11/2021    3:02 PM 01/10/2021    2:05 PM 12/09/2020   11:10 AM  PHQ 2/9 Scores  PHQ - 2 Score 0 0 0 0 1 0 0  Fall Risk    09/16/2021    1:15 PM 09/03/2021   11:12 AM  08/19/2021   10:53 AM 03/11/2021    3:03 PM 01/10/2021    2:05 PM  Fall Risk   Falls in the past year? 0 0 0 0 0  Number falls in past yr: 0 0 0 0   Injury with Fall? 0      Follow up Falls evaluation completed        [place]:  Any stairs in or around the home? Yes  If so, are there any without handrails? No  Home free of loose throw rugs in walkways, pet beds, electrical cords, etc? Yes  Adequate lighting in your home to reduce risk of falls? Yes   ASSISTIVE DEVICES UTILIZED TO PREVENT FALLS:  Life alert? No  Use of a cane, walker or w/c? No  Grab bars in the bathroom? Yes  Shower chair or bench in shower? Yes  Elevated toilet seat or a handicapped toilet? Yes    Cognitive Function:  Normal cognitive status assessed by telephone conversation  by this Nurse Health Advisor. No abnormalities found.      06/24/2017    4:03 PM 02/26/2016    1:59 PM  MMSE - Mini Mental State Exam  Orientation to time 5 5  Orientation to Place 5 5  Registration 3 3  Attention/ Calculation 5 5  Recall 3 3  Language- name 2 objects 2 2  Language- repeat 1 1  Language- follow 3 step command 3 3  Language- read & follow direction 1 1  Write a sentence 1 1  Copy design 1 1  Total score 30 30        Immunizations Immunization History  Administered Date(s) Administered   Fluad Quad(high Dose 65+) 01/24/2019   Influenza Split 01/24/2014   Influenza, High Dose Seasonal PF 01/27/2018   Influenza,inj,Quad PF,6+ Mos 12/31/2015   Influenza-Unspecified 01/24/2013, 01/02/2014, 02/18/2015, 12/26/2016   Pneumococcal Conjugate-13 12/31/2015   Pneumococcal Polysaccharide-23 04/27/2012   Rabies, IM 05/02/2018, 05/05/2018, 05/09/2018, 05/16/2018   Td 05/25/1988   Tdap 02/03/2014   Zoster Recombinat (Shingrix) 01/06/2018, 04/11/2018   Zoster, Live 04/28/2010    TDAP status: Up to date  Flu Vaccine status: Up to date  Pneumococcal vaccine status: Up to  date  Covid-19 vaccine status: Completed vaccines  Qualifies for Shingles Vaccine? Yes   Zostavax completed No   Shingrix Completed?: No.    Education has been provided regarding the importance of this vaccine. Patient has been advised to call insurance company to determine out of pocket expense if they have not yet received this vaccine. Advised may also receive vaccine at local pharmacy or Health Dept. Verbalized acceptance and understanding.  Screening Tests Health Maintenance  Topic Date Due   Fecal DNA (Cologuard)  08/12/2022 (Originally 10/03/2020)   PAP SMEAR-Modifier  01/23/2023   TETANUS/TDAP  02/04/2024   DEXA SCAN  Completed   Hepatitis C Screening  Completed   Zoster Vaccines- Shingrix  Completed   HPV VACCINES  Aged Out   Pneumonia Vaccine 13+ Years old  Discontinued   INFLUENZA VACCINE  Discontinued   MAMMOGRAM  Discontinued   COVID-19 Vaccine  Discontinued    Health Maintenance  There are no preventive care reminders to display for this patient.  Colorectal cancer screening: Referral to GI placed patient has cologuard kit to complete. Pt aware the office will call re: appt.  Mammogram status: Ordered declined .  Pt provided with contact info and advised to call to schedule appt.   Bone Density status: Completed 10/31/2019. Results reflect: Bone density results: OSTEOPENIA. Repeat every 5 years.  Lung Cancer Screening: (Low Dose CT Chest recommended if Age 59-80 years, 30 pack-year currently smoking OR have quit w/in 15years.) does not qualify.   Lung Cancer Screening Referral: n/a  Additional Screening:  Hepatitis C Screening: does not qualify;   Vision Screening: Recommended annual ophthalmology exams for early detection of glaucoma and other disorders of the eye. Is the patient up to date with their annual eye exam?  Yes  Who is the provider or what is the name of the office in which the patient attends annual eye exams? [facility]  If pt is not  established with a provider, would they like to be referred to a provider to establish care? No .   Dental Screening: Recommended annual dental exams for proper oral hygiene  Community Resource Referral / Chronic Care Management: CRR required this visit?  No   CCM required this visit?  No      Plan:     I have personally reviewed and noted the following in the patient's chart:   Medical and social history Use of alcohol, tobacco or illicit drugs  Current medications and supplements including opioid prescriptions.  Functional ability and status Nutritional status Physical activity Advanced directives List of other physicians Hospitalizations, surgeries, and ER visits in previous 12 months Vitals Screenings to include cognitive, depression, and falls Referrals and appointments  In addition, I have reviewed and discussed with patient certain preventive protocols, quality metrics, and best practice recommendations. A written personalized care plan for preventive services as well as general preventive health recommendations were provided to patient.     Randel Pigg, LPN   10/31/6149   Nurse Notes: none

## 2021-09-16 NOTE — Patient Instructions (Signed)
[name] , Thank you for taking time to come for your Medicare Wellness Visit. I appreciate your ongoing commitment to your health goals. Please review the following plan we discussed and let me know if I can assist you in the future.   Screening recommendations/referrals: Colonoscopy: Patient has Cologuard Kit  Mammogram: declined  Bone Density: 10/31/2019 Recommended yearly ophthalmology/optometry visit for glaucoma screening and checkup Recommended yearly dental visit for hygiene and checkup  Vaccinations: Influenza vaccine: completed  Pneumococcal vaccine: completed  Tdap vaccine: 10/102015 Shingles vaccine: completed     Advanced directives: yes  Conditions/risks identified: none   Next appointment: none    Preventive Care 23 Years and Older, Female Preventive care refers to lifestyle choices and visits with your health care provider that can promote health and wellness. What does preventive care include? A yearly physical exam. This is also called an annual well check. Dental exams once or twice a year. Routine eye exams. Ask your health care provider how often you should have your eyes checked. Personal lifestyle choices, including: Daily care of your teeth and gums. Regular physical activity. Eating a healthy diet. Avoiding tobacco and drug use. Limiting alcohol use. Practicing safe sex. Taking low-dose aspirin every day. Taking vitamin and mineral supplements as recommended by your health care provider. What happens during an annual well check? The services and screenings done by your health care provider during your annual well check will depend on your age, overall health, lifestyle risk factors, and family history of disease. Counseling  Your health care provider may ask you questions about your: Alcohol use. Tobacco use. Drug use. Emotional well-being. Home and relationship well-being. Sexual activity. Eating habits. History of falls. Memory and ability  to understand (cognition). Work and work Statistician. Reproductive health. Screening  You may have the following tests or measurements: Height, weight, and BMI. Blood pressure. Lipid and cholesterol levels. These may be checked every 5 years, or more frequently if you are over 47 years old. Skin check. Lung cancer screening. You may have this screening every year starting at age 76 if you have a 30-pack-year history of smoking and currently smoke or have quit within the past 15 years. Fecal occult blood test (FOBT) of the stool. You may have this test every year starting at age 71. Flexible sigmoidoscopy or colonoscopy. You may have a sigmoidoscopy every 5 years or a colonoscopy every 10 years starting at age 19. Hepatitis C blood test. Hepatitis B blood test. Sexually transmitted disease (STD) testing. Diabetes screening. This is done by checking your blood sugar (glucose) after you have not eaten for a while (fasting). You may have this done every 1-3 years. Bone density scan. This is done to screen for osteoporosis. You may have this done starting at age 24. Mammogram. This may be done every 1-2 years. Talk to your health care provider about how often you should have regular mammograms. Talk with your health care provider about your test results, treatment options, and if necessary, the need for more tests. Vaccines  Your health care provider may recommend certain vaccines, such as: Influenza vaccine. This is recommended every year. Tetanus, diphtheria, and acellular pertussis (Tdap, Td) vaccine. You may need a Td booster every 10 years. Zoster vaccine. You may need this after age 49. Pneumococcal 13-valent conjugate (PCV13) vaccine. One dose is recommended after age 11. Pneumococcal polysaccharide (PPSV23) vaccine. One dose is recommended after age 23. Talk to your health care provider about which screenings and vaccines you need  and how often you need them. This information is not  intended to replace advice given to you by your health care provider. Make sure you discuss any questions you have with your health care provider. Document Released: 05/10/2015 Document Revised: 01/01/2016 Document Reviewed: 02/12/2015 Elsevier Interactive Patient Education  2017 [place] Prevention in the Home Falls can cause injuries. They can happen to people of all ages. There are many things you can do to make your home safe and to help prevent falls. What can I do on the outside of my home? Regularly fix the edges of walkways and driveways and fix any cracks. Remove anything that might make you trip as you walk through a door, such as a raised step or threshold. Trim any bushes or trees on the path to your home. Use bright outdoor lighting. Clear any walking paths of anything that might make someone trip, such as rocks or tools. Regularly check to see if handrails are loose or broken. Make sure that both sides of any steps have handrails. Any raised decks and porches should have guardrails on the edges. Have any leaves, snow, or ice cleared regularly. Use sand or salt on walking paths during winter. Clean up any spills in your garage right away. This includes oil or grease spills. What can I do in the bathroom? Use night lights. Install grab bars by the toilet and in the tub and shower. Do not use towel bars as grab bars. Use non-skid mats or decals in the tub or shower. If you need to sit down in the shower, use a plastic, non-slip stool. Keep the floor dry. Clean up any water that spills on the floor as soon as it happens. Remove soap buildup in the tub or shower regularly. Attach bath mats securely with double-sided non-slip rug tape. Do not have throw rugs and other things on the floor that can make you trip. What can I do in the bedroom? Use night lights. Make sure that you have a light by your bed that is easy to reach. Do not use any sheets or blankets that are  too big for your bed. They should not hang down onto the floor. Have a firm chair that has side arms. You can use this for support while you get dressed. Do not have throw rugs and other things on the floor that can make you trip. What can I do in the kitchen? Clean up any spills right away. Avoid walking on wet floors. Keep items that you use a lot in easy-to-reach places. If you need to reach something above you, use a strong step stool that has a grab bar. Keep electrical cords out of the way. Do not use floor polish or wax that makes floors slippery. If you must use wax, use non-skid floor wax. Do not have throw rugs and other things on the floor that can make you trip. What can I do with my stairs? Do not leave any items on the stairs. Make sure that there are handrails on both sides of the stairs and use them. Fix handrails that are broken or loose. Make sure that handrails are as long as the stairways. Check any carpeting to make sure that it is firmly attached to the stairs. Fix any carpet that is loose or worn. Avoid having throw rugs at the top or bottom of the stairs. If you do have throw rugs, attach them to the floor with carpet tape. Make sure that you  have a light switch at the top of the stairs and the bottom of the stairs. If you do not have them, ask someone to add them for you. What else can I do to help prevent falls? Wear shoes that: Do not have high heels. Have rubber bottoms. Are comfortable and fit you well. Are closed at the toe. Do not wear sandals. If you use a stepladder: Make sure that it is fully opened. Do not climb a closed stepladder. Make sure that both sides of the stepladder are locked into place. Ask someone to hold it for you, if possible. Clearly mark and make sure that you can see: Any grab bars or handrails. First and last steps. Where the edge of each step is. Use tools that help you move around (mobility aids) if they are needed. These  include: Canes. Walkers. Scooters. Crutches. Turn on the lights when you go into a dark area. Replace any light bulbs as soon as they burn out. Set up your furniture so you have a clear path. Avoid moving your furniture around. If any of your floors are uneven, fix them. If there are any pets around you, be aware of where they are. Review your medicines with your doctor. Some medicines can make you feel dizzy. This can increase your chance of falling. Ask your doctor what other things that you can do to help prevent falls. This information is not intended to replace advice given to you by your health care provider. Make sure you discuss any questions you have with your health care provider. Document Released: 02/07/2009 Document Revised: 09/19/2015 Document Reviewed: 05/18/2014 Elsevier Interactive Patient Education  2017 [employer].

## 2021-09-23 DIAGNOSIS — L900 Lichen sclerosus et atrophicus: Secondary | ICD-10-CM | POA: Diagnosis not present

## 2021-10-14 ENCOUNTER — Other Ambulatory Visit: Payer: Self-pay | Admitting: Family Medicine

## 2021-10-14 DIAGNOSIS — E039 Hypothyroidism, unspecified: Secondary | ICD-10-CM

## 2021-10-16 ENCOUNTER — Telehealth: Payer: Self-pay | Admitting: Family Medicine

## 2021-10-16 DIAGNOSIS — E559 Vitamin D deficiency, unspecified: Secondary | ICD-10-CM

## 2021-10-16 DIAGNOSIS — R42 Dizziness and giddiness: Secondary | ICD-10-CM

## 2021-10-16 NOTE — Telephone Encounter (Signed)
Pt stated that she talked to ENT  [place]  [phone] pt stated that she needs a referral from you .

## 2021-10-16 NOTE — Telephone Encounter (Signed)
Spoke with patient regarding requested referral to ENT per patient she would like the referral to be seen for continued Vertigo. Okay to send referral?

## 2021-10-17 ENCOUNTER — Other Ambulatory Visit (INDEPENDENT_AMBULATORY_CARE_PROVIDER_SITE_OTHER): Payer: Medicare HMO

## 2021-10-17 DIAGNOSIS — E559 Vitamin D deficiency, unspecified: Secondary | ICD-10-CM | POA: Diagnosis not present

## 2021-10-17 LAB — VITAMIN D 25 HYDROXY (VIT D DEFICIENCY, FRACTURES): VITD: 55.63 ng/mL (ref 30.00–100.00)

## 2021-10-17 NOTE — Telephone Encounter (Signed)
Patient aware that referral placed  

## 2021-10-30 ENCOUNTER — Ambulatory Visit: Payer: Medicare HMO | Admitting: Neurology

## 2021-10-30 ENCOUNTER — Telehealth: Payer: Self-pay | Admitting: Neurology

## 2021-10-30 VITALS — BP 125/73 | HR 76 | Ht 67.0 in | Wt 181.0 lb

## 2021-10-30 DIAGNOSIS — R519 Headache, unspecified: Secondary | ICD-10-CM | POA: Diagnosis not present

## 2021-10-30 DIAGNOSIS — M542 Cervicalgia: Secondary | ICD-10-CM

## 2021-10-30 DIAGNOSIS — G8929 Other chronic pain: Secondary | ICD-10-CM | POA: Diagnosis not present

## 2021-10-30 DIAGNOSIS — R6889 Other general symptoms and signs: Secondary | ICD-10-CM | POA: Diagnosis not present

## 2021-10-30 DIAGNOSIS — E538 Deficiency of other specified B group vitamins: Secondary | ICD-10-CM | POA: Diagnosis not present

## 2021-10-30 DIAGNOSIS — R7982 Elevated C-reactive protein (CRP): Secondary | ICD-10-CM | POA: Diagnosis not present

## 2021-10-30 DIAGNOSIS — R269 Unspecified abnormalities of gait and mobility: Secondary | ICD-10-CM | POA: Diagnosis not present

## 2021-10-30 DIAGNOSIS — R7989 Other specified abnormal findings of blood chemistry: Secondary | ICD-10-CM | POA: Diagnosis not present

## 2021-10-30 DIAGNOSIS — Z01812 Encounter for preprocedural laboratory examination: Secondary | ICD-10-CM | POA: Diagnosis not present

## 2021-10-30 DIAGNOSIS — R700 Elevated erythrocyte sedimentation rate: Secondary | ICD-10-CM | POA: Diagnosis not present

## 2021-10-30 MED ORDER — VENLAFAXINE HCL ER 37.5 MG PO CP24: 37.5000 mg | ORAL_CAPSULE | Freq: Every day | ORAL | 6 refills | Status: DC

## 2021-10-30 NOTE — Telephone Encounter (Signed)
[employer] sent to GI they obtain auth 
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
## 2021-10-31 ENCOUNTER — Encounter: Payer: Self-pay | Admitting: Neurology

## 2021-10-31 LAB — COMPREHENSIVE METABOLIC PANEL
ALT: 9 IU/L (ref 0–32)
AST: 14 IU/L (ref 0–40)
Albumin/Globulin Ratio: 2.1 (ref 1.2–2.2)
Albumin: 4.6 g/dL (ref 3.7–4.7)
Alkaline Phosphatase: 92 IU/L (ref 44–121)
BUN/Creatinine Ratio: 14 (ref 12–28)
BUN: 14 mg/dL (ref 8–27)
Bilirubin Total: 0.4 mg/dL (ref 0.0–1.2)
CO2: 23 mmol/L (ref 20–29)
Calcium: 9.4 mg/dL (ref 8.7–10.3)
Chloride: 102 mmol/L (ref 96–106)
Creatinine, Ser: 0.98 mg/dL (ref 0.57–1.00)
Globulin, Total: 2.2 g/dL (ref 1.5–4.5)
Glucose: 93 mg/dL (ref 70–99)
Potassium: 5 mmol/L (ref 3.5–5.2)
Sodium: 139 mmol/L (ref 134–144)
Total Protein: 6.8 g/dL (ref 6.0–8.5)
eGFR: 62 mL/min/{1.73_m2} (ref >59)

## 2021-10-31 LAB — CBC WITH DIFFERENTIAL/PLATELET
Basophils Absolute: 0.1 10*3/uL (ref 0.0–0.2)
Basos: 1 %
EOS (ABSOLUTE): 0.2 10*3/uL (ref 0.0–0.4)
Eos: 2 %
Hematocrit: 46.3 % (ref 34.0–46.6)
Hemoglobin: 15.4 g/dL (ref 11.1–15.9)
Immature Grans (Abs): 0 10*3/uL (ref 0.0–0.1)
Immature Granulocytes: 0 %
Lymphocytes Absolute: 2.6 10*3/uL (ref 0.7–3.1)
Lymphs: 27 %
MCH: 28.2 pg (ref 26.6–33.0)
MCHC: 33.3 g/dL (ref 31.5–35.7)
MCV: 85 fL (ref 79–97)
Monocytes Absolute: 0.7 10*3/uL (ref 0.1–0.9)
Monocytes: 7 %
Neutrophils Absolute: 6 10*3/uL (ref 1.4–7.0)
Neutrophils: 63 %
Platelets: 324 10*3/uL (ref 150–450)
RBC: 5.46 x10E6/uL — ABNORMAL HIGH (ref 3.77–5.28)
RDW: 13.2 % (ref 11.7–15.4)
WBC: 9.7 10*3/uL (ref 3.4–10.8)

## 2021-10-31 LAB — VITAMIN B12: Vitamin B-12: 354 pg/mL (ref 232–1245)

## 2021-10-31 LAB — C-REACTIVE PROTEIN: CRP: 2 mg/L (ref 0–10)

## 2021-10-31 LAB — TSH: TSH: 0.619 u[IU]/mL (ref 0.450–4.500)

## 2021-10-31 LAB — SEDIMENTATION RATE: Sed Rate: 4 mm/hr (ref 0–40)

## 2021-11-06 ENCOUNTER — Encounter: Payer: Self-pay | Admitting: Neurology

## 2021-11-07 ENCOUNTER — Encounter: Payer: Self-pay | Admitting: Family Medicine

## 2021-11-07 ENCOUNTER — Telehealth: Payer: Self-pay | Admitting: Family Medicine

## 2021-11-07 NOTE — Telephone Encounter (Signed)
Type of forms received:jury duty excuse  Routed [id number]  Paperwork received by : terrill   Individual made aware of 3-5 business day turn around (Y/N):y  Form completed and patient made aware of charges(Y/N):y   Faxed to :   Form location:   dr Ethelene Hal folder

## 2021-11-07 NOTE — Telephone Encounter (Signed)
Form received patient also sent message via Mychart regarding this matter. Waiting to be completed and signed by Provider.

## 2021-11-11 ENCOUNTER — Encounter: Payer: Self-pay | Admitting: Neurology

## 2021-11-12 ENCOUNTER — Other Ambulatory Visit: Payer: Medicare HMO

## 2021-11-12 NOTE — Telephone Encounter (Signed)
From GI: Her appointment was originally scheduled for today, yesterday before we had to reschedule [employer] had only approved the spine mri and the brain mri was still pending review. I explained that she could keep her appt for the spine mri but we would have to reschedule the brain. She did state that it would cost more to do them different days which she is correct so I understand that. So we went ahead and rescheduled her for the 29th, however as of this morning the brain mri was approved as well. So both are approved now and she is good to go!

## 2021-11-14 ENCOUNTER — Encounter: Payer: Self-pay | Admitting: Neurology

## 2021-11-14 ENCOUNTER — Encounter: Payer: Self-pay | Admitting: *Deleted

## 2021-11-22 ENCOUNTER — Ambulatory Visit
Admission: RE | Admit: 2021-11-22 | Discharge: 2021-11-22 | Disposition: A | Discharge status: Home / Self Care | Payer: Medicare HMO | Source: Ambulatory Visit | Attending: Neurology | Admitting: Neurology

## 2021-11-22 DIAGNOSIS — G8929 Other chronic pain: Secondary | ICD-10-CM

## 2021-11-22 DIAGNOSIS — R519 Headache, unspecified: Secondary | ICD-10-CM

## 2021-11-22 DIAGNOSIS — R269 Unspecified abnormalities of gait and mobility: Secondary | ICD-10-CM

## 2021-11-22 DIAGNOSIS — M542 Cervicalgia: Secondary | ICD-10-CM | POA: Diagnosis not present

## 2021-11-22 MED ORDER — GADOBENATE DIMEGLUMINE 529 MG/ML IV SOLN
17.0000 mL | Freq: Once | INTRAVENOUS | Status: AC | PRN
  Administered 2021-11-22: 17 mL via INTRAVENOUS

## 2021-11-24 ENCOUNTER — Telehealth: Payer: Self-pay | Admitting: *Deleted

## 2021-11-24 NOTE — Telephone Encounter (Signed)
I spoke with the patient and informed her of the results of the MRI Neck. The patient verbalized understanding of the findings and expressed appreciation for the call. She stated she prefers to further discuss the findings with Dr. Krista Blue upon her return before making a decision for surgery consultation.

## 2021-11-24 NOTE — Telephone Encounter (Signed)
I spoke with the patient and informed her of the results. She verbalized understanding and appreciation for the call.

## 2021-11-24 NOTE — Telephone Encounter (Signed)
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

## 2021-11-24 NOTE — Telephone Encounter (Signed)
-----   Message from Star Age, MD sent at 11/24/2021 12:17 PM EDT ----- Neck MRI showed degenerative changes at multiple levels, most prominent at C5-6 with arthritic changes.  If she would like to see a spine surgeon for consultation, we can facilitate with a referral to neurosurgery or orthopedics, as per her preference or if she would like to wait and discuss this further with primary neurologist, Dr. Krista Blue upon her return, we can also wait.

## 2021-11-25 ENCOUNTER — Encounter: Payer: Self-pay | Admitting: Neurology

## 2021-12-08 NOTE — Telephone Encounter (Signed)
She is on schedule for October 2023  May move up her appointment to my next available, if she prefers virtual visit, it is OK too

## 2021-12-09 NOTE — Telephone Encounter (Signed)
I spoke to the patient. She was agreeable to move her appt to 12/24/21.

## 2021-12-11 DIAGNOSIS — H938X3 Other specified disorders of ear, bilateral: Secondary | ICD-10-CM | POA: Diagnosis not present

## 2021-12-24 ENCOUNTER — Ambulatory Visit: Payer: Medicare HMO | Admitting: Neurology

## 2021-12-24 ENCOUNTER — Encounter: Payer: Self-pay | Admitting: Neurology

## 2021-12-24 VITALS — BP 157/88 | HR 99 | Ht 67.0 in | Wt 176.0 lb

## 2021-12-24 DIAGNOSIS — G4486 Cervicogenic headache: Secondary | ICD-10-CM | POA: Insufficient documentation

## 2021-12-24 DIAGNOSIS — I679 Cerebrovascular disease, unspecified: Secondary | ICD-10-CM | POA: Insufficient documentation

## 2021-12-24 DIAGNOSIS — M47812 Spondylosis without myelopathy or radiculopathy, cervical region: Secondary | ICD-10-CM | POA: Diagnosis not present

## 2021-12-24 DIAGNOSIS — G4710 Hypersomnia, unspecified: Secondary | ICD-10-CM

## 2021-12-24 MED ORDER — VENLAFAXINE HCL ER 37.5 MG PO CP24: 37.5000 mg | ORAL_CAPSULE | Freq: Every day | ORAL | 3 refills | Status: DC
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
## 2022-01-05 ENCOUNTER — Ambulatory Visit (HOSPITAL_COMMUNITY)
Admission: RE | Admit: 2022-01-05 | Discharge: 2022-01-05 | Disposition: A | Discharge status: Home / Self Care | Payer: Medicare HMO | Source: Ambulatory Visit | Attending: Neurology | Admitting: Neurology

## 2022-01-05 ENCOUNTER — Ambulatory Visit (HOSPITAL_BASED_OUTPATIENT_CLINIC_OR_DEPARTMENT_OTHER)
Admission: RE | Admit: 2022-01-05 | Discharge: 2022-01-05 | Disposition: A | Discharge status: Home / Self Care | Payer: Medicare HMO | Source: Ambulatory Visit | Attending: Neurology | Admitting: Neurology

## 2022-01-05 DIAGNOSIS — I3481 Nonrheumatic mitral (valve) annulus calcification: Secondary | ICD-10-CM | POA: Insufficient documentation

## 2022-01-05 DIAGNOSIS — I679 Cerebrovascular disease, unspecified: Secondary | ICD-10-CM

## 2022-01-05 DIAGNOSIS — G4710 Hypersomnia, unspecified: Secondary | ICD-10-CM

## 2022-01-05 DIAGNOSIS — G4486 Cervicogenic headache: Secondary | ICD-10-CM

## 2022-01-05 DIAGNOSIS — M47812 Spondylosis without myelopathy or radiculopathy, cervical region: Secondary | ICD-10-CM | POA: Diagnosis not present

## 2022-01-05 DIAGNOSIS — I517 Cardiomegaly: Secondary | ICD-10-CM | POA: Insufficient documentation

## 2022-01-05 DIAGNOSIS — G459 Transient cerebral ischemic attack, unspecified: Secondary | ICD-10-CM | POA: Insufficient documentation

## 2022-01-05 LAB — ECHOCARDIOGRAM COMPLETE
AR max vel: 1.8 cm2
AV Peak grad: 7.1 mmHg
Ao pk vel: 1.34 m/s
Area-P 1/2: 2.67 cm2
S' Lateral: 2.8 cm

## 2022-01-05 NOTE — Progress Notes (Signed)
VASCULAR LAB    Carotid duplex has been performed.  See CV proc for preliminary results.   Janaiya Beauchesne, RVT 01/05/2022, 2:25 PM

## 2022-01-12 ENCOUNTER — Other Ambulatory Visit: Payer: Self-pay | Admitting: Family Medicine

## 2022-01-12 DIAGNOSIS — E039 Hypothyroidism, unspecified: Secondary | ICD-10-CM

## 2022-01-14 LAB — COLOGUARD

## 2022-01-15 DIAGNOSIS — D2239 Melanocytic nevi of other parts of face: Secondary | ICD-10-CM | POA: Diagnosis not present

## 2022-01-15 DIAGNOSIS — D225 Melanocytic nevi of trunk: Secondary | ICD-10-CM | POA: Diagnosis not present

## 2022-01-15 DIAGNOSIS — L821 Other seborrheic keratosis: Secondary | ICD-10-CM | POA: Diagnosis not present

## 2022-01-15 DIAGNOSIS — L570 Actinic keratosis: Secondary | ICD-10-CM | POA: Diagnosis not present

## 2022-01-15 DIAGNOSIS — L578 Other skin changes due to chronic exposure to nonionizing radiation: Secondary | ICD-10-CM | POA: Diagnosis not present

## 2022-02-02 ENCOUNTER — Ambulatory Visit
Admission: RE | Admit: 2022-02-02 | Discharge: 2022-02-02 | Disposition: A | Discharge status: Home / Self Care | Payer: Medicare HMO | Source: Ambulatory Visit | Attending: Acute Care | Admitting: Acute Care

## 2022-02-02 DIAGNOSIS — Z87891 Personal history of nicotine dependence: Secondary | ICD-10-CM

## 2022-02-02 DIAGNOSIS — F17210 Nicotine dependence, cigarettes, uncomplicated: Secondary | ICD-10-CM | POA: Diagnosis not present

## 2022-02-02 DIAGNOSIS — R69 Illness, unspecified: Secondary | ICD-10-CM | POA: Diagnosis not present

## 2022-02-02 DIAGNOSIS — M439 Deforming dorsopathy, unspecified: Secondary | ICD-10-CM | POA: Diagnosis not present

## 2022-02-02 DIAGNOSIS — J432 Centrilobular emphysema: Secondary | ICD-10-CM | POA: Diagnosis not present

## 2022-02-02 DIAGNOSIS — I2510 Atherosclerotic heart disease of native coronary artery without angina pectoris: Secondary | ICD-10-CM | POA: Diagnosis not present

## 2022-02-04 ENCOUNTER — Other Ambulatory Visit: Payer: Self-pay

## 2022-02-04 DIAGNOSIS — L900 Lichen sclerosus et atrophicus: Secondary | ICD-10-CM | POA: Diagnosis not present

## 2022-02-04 DIAGNOSIS — Z122 Encounter for screening for malignant neoplasm of respiratory organs: Secondary | ICD-10-CM

## 2022-02-04 DIAGNOSIS — F17210 Nicotine dependence, cigarettes, uncomplicated: Secondary | ICD-10-CM

## 2022-02-04 DIAGNOSIS — Z87891 Personal history of nicotine dependence: Secondary | ICD-10-CM

## 2022-02-05 ENCOUNTER — Ambulatory Visit: Payer: Medicare HMO | Admitting: Neurology

## 2022-03-03 ENCOUNTER — Ambulatory Visit: Payer: Medicare HMO | Admitting: Family Medicine

## 2022-03-05 DIAGNOSIS — H26491 Other secondary cataract, right eye: Secondary | ICD-10-CM | POA: Diagnosis not present

## 2022-03-05 DIAGNOSIS — H4720 Unspecified optic atrophy: Secondary | ICD-10-CM | POA: Diagnosis not present

## 2022-03-17 ENCOUNTER — Ambulatory Visit (INDEPENDENT_AMBULATORY_CARE_PROVIDER_SITE_OTHER): Payer: Medicare HMO | Admitting: Family Medicine

## 2022-03-17 ENCOUNTER — Encounter: Payer: Self-pay | Admitting: Family Medicine

## 2022-03-17 VITALS — BP 126/74 | HR 74 | Temp 97.3°F | Ht 67.0 in | Wt 172.2 lb

## 2022-03-17 DIAGNOSIS — E7800 Pure hypercholesterolemia, unspecified: Secondary | ICD-10-CM

## 2022-03-17 DIAGNOSIS — Z9189 Other specified personal risk factors, not elsewhere classified: Secondary | ICD-10-CM

## 2022-03-17 DIAGNOSIS — E039 Hypothyroidism, unspecified: Secondary | ICD-10-CM | POA: Diagnosis not present

## 2022-03-17 DIAGNOSIS — E538 Deficiency of other specified B group vitamins: Secondary | ICD-10-CM

## 2022-03-17 DIAGNOSIS — F17210 Nicotine dependence, cigarettes, uncomplicated: Secondary | ICD-10-CM | POA: Diagnosis not present

## 2022-03-17 DIAGNOSIS — R69 Illness, unspecified: Secondary | ICD-10-CM | POA: Diagnosis not present

## 2022-03-17 LAB — VITAMIN B12: Vitamin B-12: 954 pg/mL — ABNORMAL HIGH (ref 211–911)

## 2022-03-17 MED ORDER — VITAMIN B-12 1000 MCG PO TABS: 1000.0000 ug | ORAL_TABLET | Freq: Every day | ORAL | 2 refills | Status: AC

## 2022-03-17 NOTE — Progress Notes (Signed)
Established Patient Office Visit  Subjective   Patient ID: [name], female    DOB: [birth year]  Age: 71 y.o. MRN: [id number]  Chief Complaint  Patient presents with   Follow-up    6 month follow up, would like referral for dermatologist.     HPI follow-up of tobacco abuse, elevated ldl cholesterol and B12 deficiency.  Continues to smoke about a pack a day.    Review of Systems  Constitutional: Negative.   HENT: Negative.    Eyes:  Negative for blurred vision, discharge and redness.  Respiratory:  Positive for cough. Negative for hemoptysis, sputum production, shortness of breath and wheezing.   Cardiovascular: Negative.   Gastrointestinal:  Negative for abdominal pain.  Genitourinary: Negative.   Musculoskeletal: Negative.  Negative for myalgias.  Skin:  Negative for rash.  Neurological:  Negative for tingling, loss of consciousness and weakness.  Endo/Heme/Allergies:  Negative for polydipsia.      Objective:     BP 126/74 (BP Location: Left Arm, Patient Position: Sitting, Cuff Size: Normal)   Pulse 74   Temp (!) 97.3 F (36.3 C) (Temporal)   Ht '5\' 7"'$  (1.702 m)   Wt 172 lb 3.2 oz (78.1 kg)   SpO2 99%   BMI 26.97 kg/m    Physical Exam Constitutional:      General: She is not in acute distress.    Appearance: Normal appearance. She is not ill-appearing, toxic-appearing or diaphoretic.  HENT:     Head: Normocephalic and atraumatic.     Right Ear: External ear normal.     Left Ear: External ear normal.     Mouth/Throat:     Mouth: Mucous membranes are moist.     Pharynx: Oropharynx is clear. No oropharyngeal exudate or posterior oropharyngeal erythema.  Eyes:     General: No scleral icterus.       Right eye: No discharge.        Left eye: No discharge.     Extraocular Movements: Extraocular movements intact.     Conjunctiva/sclera: Conjunctivae normal.     Pupils: Pupils are equal, round, and reactive to light.  Cardiovascular:     Rate and Rhythm:  Normal rate and regular rhythm.  Pulmonary:     Effort: Pulmonary effort is normal. No respiratory distress.     Breath sounds: Normal breath sounds.  Abdominal:     General: Bowel sounds are normal.     Tenderness: There is no abdominal tenderness. There is no guarding.  Musculoskeletal:     Cervical back: No rigidity or tenderness.  Skin:    General: Skin is warm and dry.  Neurological:     Mental Status: She is alert and oriented to person, place, and time.  Psychiatric:        Mood and Affect: Mood normal.        Behavior: Behavior normal.      No results found for any visits on 03/17/22.    The 10-year ASCVD risk score (Arnett DK, et al., 2019) is: 17.7%    Assessment & Plan:   Problem List Items Addressed This Visit       Endocrine   RESOLVED: Hypothyroidism (Chronic)     Other   Cigarette nicotine dependence without complication - Primary   Elevated LDL cholesterol level   At risk for cardiovascular event   B12 deficiency   Relevant Medications   cyanocobalamin (VITAMIN B12) 1000 MCG tablet   Other Relevant Orders   Vitamin  B12    Return in about 6 months (around 09/15/2022).  We had a long discussion about quitting smoking.  Advised that with most people take several attempts.  She believes that she had had bad dreams with Chantix in the past but otherwise tolerated it.  Said that it was too expensive for her to afford now.  Advised that there is a generic available.  She will call her insurance company.  ASCVD risk score is elevated mostly due to to her smoking.  She understands that by quitting smoking, that alone would lower her risk score significantly.  We will call [facility] dermatology to schedule a mole check.  Can refer if needed.  Rechecking B12 levels.  Information was given on B12 deficiency, health risks of smoking and Chantix.  She may be willing to try Chantix again.  Discussed issues if Chantix is being depression, bad dreams and extreme anger.   Developed breathing issues after multiple different vaccines and avoid standing for that reason.  Libby Maw, MD

## 2022-04-14 ENCOUNTER — Encounter: Payer: Self-pay | Admitting: Neurology

## 2022-04-15 MED ORDER — VENLAFAXINE HCL ER 75 MG PO CP24: 75.0000 mg | ORAL_CAPSULE | Freq: Every day | ORAL | 3 refills | Status: DC

## 2022-04-15 NOTE — Telephone Encounter (Signed)
Meds ordered this encounter  Medications   venlafaxine XR (EFFEXOR XR) 75 MG 24 hr capsule    Sig: Take 1 capsule (75 mg total) by mouth daily with breakfast.    Dispense:  90 capsule    Refill:  3

## 2022-07-03 ENCOUNTER — Encounter: Payer: Self-pay | Admitting: Neurology

## 2022-07-21 ENCOUNTER — Encounter: Payer: Self-pay | Admitting: Family Medicine

## 2022-07-21 ENCOUNTER — Ambulatory Visit (INDEPENDENT_AMBULATORY_CARE_PROVIDER_SITE_OTHER): Payer: Medicare HMO | Admitting: Family Medicine

## 2022-07-21 VITALS — BP 134/76 | HR 76 | Temp 98.3°F | Ht 67.0 in | Wt 173.2 lb

## 2022-07-21 DIAGNOSIS — R69 Illness, unspecified: Secondary | ICD-10-CM | POA: Diagnosis not present

## 2022-07-21 DIAGNOSIS — T466X5A Adverse effect of antihyperlipidemic and antiarteriosclerotic drugs, initial encounter: Secondary | ICD-10-CM | POA: Diagnosis not present

## 2022-07-21 DIAGNOSIS — E039 Hypothyroidism, unspecified: Secondary | ICD-10-CM

## 2022-07-21 DIAGNOSIS — M791 Myalgia, unspecified site: Secondary | ICD-10-CM | POA: Diagnosis not present

## 2022-07-21 DIAGNOSIS — F17210 Nicotine dependence, cigarettes, uncomplicated: Secondary | ICD-10-CM

## 2022-07-21 DIAGNOSIS — R251 Tremor, unspecified: Secondary | ICD-10-CM | POA: Diagnosis not present

## 2022-07-21 NOTE — Progress Notes (Signed)
Established Patient Office Visit   Subjective:  Patient ID: [name], female    DOB: [birth year]  Age: 72 y.o. MRN: [id number]  Chief Complaint  Patient presents with   Thyroid Problem    Patient would like thyroid levels checked due to frequent hand tremors.     Thyroid Problem Symptoms include tremors.   Encounter Diagnoses  Name Primary?   Hypothyroidism, unspecified type Yes   Tremor    Cigarette nicotine dependence without complication    Myalgia due to statin    Status post visit with the neurologist ongoing neck pain, tremor was noticed.  Here for follow-up.  Continues levothyroxine 75 mcg daily for hypothyroidism.  Last thyroid ultrasound in 2021 was reviewed today.  Nodules that had been followed were stable and no further follow-up was recommended.   Review of Systems  Constitutional: Negative.   HENT: Negative.    Eyes:  Negative for blurred vision, discharge and redness.  Respiratory: Negative.    Cardiovascular: Negative.   Gastrointestinal:  Negative for abdominal pain.  Genitourinary: Negative.   Musculoskeletal: Negative.  Negative for myalgias.  Skin:  Negative for rash.  Neurological:  Positive for tremors. Negative for tingling, loss of consciousness and weakness.  Endo/Heme/Allergies:  Negative for polydipsia.      07/21/2022    1:43 PM 03/17/2022    1:02 PM 09/16/2021    1:16 PM  Depression screen PHQ 2/9  Decreased Interest 0 0 0  Down, Depressed, Hopeless 0 0 0  PHQ - 2 Score 0 0 0      Current Outpatient Medications:    aspirin 325 MG tablet, Take 325 mg by mouth daily., Disp: , Rfl:    Cholecalciferol (VITAMIN D3 PO), Take 1 tablet by mouth daily., Disp: , Rfl:    cyanocobalamin (VITAMIN B12) 1000 MCG tablet, Take 1 tablet (1,000 mcg total) by mouth daily., Disp: 90 tablet, Rfl: 2   EPINEPHRINE 0.3 mg/0.3 mL IJ SOAJ injection, INJECT 0.3 MLS (0.3 MG TOTAL) INTO THE MUSCLE AS NEEDED FOR ANAPHYLAXIS., Disp: 2 each, Rfl: 1    mometasone (NASONEX) 50 MCG/ACT nasal spray, Place 2 sprays into the nose daily., Disp: 1 each, Rfl: 12   Polyethyl Glycol-Propyl Glycol (SYSTANE PRESERVATIVE FREE OP), Apply to eye., Disp: , Rfl:    TIROSINT 75 MCG CAPS, TAKE ONE CAPSULE BY MOUTH ONE TIME DAILY, Disp: 90 capsule, Rfl: 3   triamcinolone ointment (KENALOG) 0.1 %, triamcinolone acetonide 0.1 % topical ointment  APPLY A THIN LAYER TO THE AFFECTED vulvar AREA(S) BY TOPICAL ROUTE daily, Disp: , Rfl:    venlafaxine XR (EFFEXOR XR) 75 MG 24 hr capsule, Take 1 capsule (75 mg total) by mouth daily with breakfast., Disp: 90 capsule, Rfl: 3   Objective:     BP 134/76 (BP Location: Right Arm, Patient Position: Sitting, Cuff Size: Normal)   Pulse 76   Temp 98.3 F (36.8 C) (Temporal)   Ht 5\' 7"  (1.702 m)   Wt 173 lb 3.2 oz (78.6 kg)   SpO2 97%   BMI 27.13 kg/m    Physical Exam Constitutional:      General: She is not in acute distress.    Appearance: Normal appearance. She is not ill-appearing, toxic-appearing or diaphoretic.  HENT:     Head: Normocephalic and atraumatic.     Right Ear: External ear normal.     Left Ear: External ear normal.  Eyes:     General: No scleral icterus.  Right eye: No discharge.        Left eye: No discharge.     Extraocular Movements: Extraocular movements intact.     Conjunctiva/sclera: Conjunctivae normal.  Cardiovascular:     Rate and Rhythm: Normal rate and regular rhythm.  Pulmonary:     Effort: Pulmonary effort is normal. No respiratory distress.     Breath sounds: Decreased air movement present.  Skin:    General: Skin is warm and dry.  Neurological:     Mental Status: She is alert and oriented to person, place, and time.     Motor: Tremor present. No weakness.     Coordination: Finger-Nose-Finger Test normal.     Gait: Gait is intact.     Comments: Tremor does appear to be a little worse on the right than the left with outstretched hands.  There was no intention tremor noted.   Psychiatric:        Mood and Affect: Mood normal.        Behavior: Behavior normal.      No results found for any visits on 07/21/22.    The 10-year ASCVD risk score (Arnett DK, et al., 2019) is: 19.7%    Assessment & Plan:   Hypothyroidism, unspecified type -     TSH  Tremor  Cigarette nicotine dependence without complication  Myalgia due to statin    Return in about 3 months (around 10/21/2022).  By exam today tremor is a little worse in the right hand than the left.  Rechecking TSH.  If it is on the low side we will consider adjusting levothyroxine downward.  She is also seeing a neurologist for this issue.  Again discussed using Chantix to help her quit smoking.  Libby Maw, MD

## 2022-07-22 LAB — TSH: TSH: 0.98 u[IU]/mL (ref 0.35–5.50)

## 2022-07-22 MED ORDER — TIROSINT 62.5 MCG PO CAPS: 1.0000 | ORAL_CAPSULE | Freq: Every day | ORAL | 0 refills | Status: DC

## 2022-07-22 NOTE — Addendum Note (Signed)
Addended by: Jon Billings on: 07/22/2022 11:51 AM   Modules accepted: Orders

## 2022-07-24 ENCOUNTER — Encounter: Payer: Self-pay | Admitting: Family Medicine

## 2022-07-24 ENCOUNTER — Encounter: Payer: Self-pay | Admitting: Neurology

## 2022-08-11 ENCOUNTER — Telehealth: Payer: Self-pay | Admitting: Family Medicine

## 2022-08-11 NOTE — Telephone Encounter (Signed)
Contacted [name] to schedule their annual wellness visit. Appointment made for 08/14/22.  [name] AWV direct phone # [phone]

## 2022-08-14 ENCOUNTER — Ambulatory Visit (INDEPENDENT_AMBULATORY_CARE_PROVIDER_SITE_OTHER): Payer: Medicare HMO

## 2022-08-14 VITALS — Ht 67.0 in | Wt 170.0 lb

## 2022-08-14 DIAGNOSIS — Z0000 Encounter for general adult medical examination without abnormal findings: Secondary | ICD-10-CM | POA: Diagnosis not present

## 2022-08-14 NOTE — Progress Notes (Signed)
I connected with  [name] on 08/14/22 by a audio enabled telemedicine application and verified that I am speaking with the correct person using two identifiers.  Patient Location: Home  Provider Location: Office/Clinic  I discussed the limitations of evaluation and management by telemedicine. The patient expressed understanding and agreed to proceed.  Subjective:   [name] is a 72 y.o. female who presents for Medicare Annual (Subsequent) preventive examination.  Review of Systems     Cardiac Risk Factors include: advanced age (>51men, >54 women);dyslipidemia;smoking/ tobacco exposure     Objective:    Today's Vitals   08/14/22 1126 08/14/22 1128  Weight: 170 lb (77.1 kg)   Height: 5\' 7"  (1.702 m)   PainSc:  6    Body mass index is 26.63 kg/m.     08/14/2022   11:35 AM 09/16/2021    1:15 PM 01/18/2019    3:23 PM 12/25/2016   11:19 AM 07/01/2016    3:05 PM 02/26/2016    1:33 PM 12/31/2015    1:48 PM  Advanced Directives  Does Patient Have a Medical Advance Directive? Yes Yes Yes No Yes Yes Yes  Type of Estate agent of [place];Living will Healthcare Power of [place];Living will Healthcare Power of [place];Living will  Healthcare Power of [employer] Power of [employer] Power of [place];Living will  Does patient want to make changes to medical advance directive?   No - Patient declined      Copy of Healthcare Power of Attorney in Chart? No - copy requested No - copy requested No - copy requested  Yes No - copy requested No - copy requested    Current Medications (verified) Outpatient Encounter Medications as of 08/14/2022  Medication Sig   aspirin 325 MG tablet Take 325 mg by mouth daily.   Cholecalciferol (VITAMIN D3 PO) Take 1 tablet by mouth daily.   cyanocobalamin (VITAMIN B12) 1000 MCG tablet Take 1 tablet (1,000 mcg total) by mouth daily.   EPINEPHRINE 0.3 mg/0.3 mL IJ SOAJ injection INJECT 0.3 MLS (0.3 MG  TOTAL) INTO THE MUSCLE AS NEEDED FOR ANAPHYLAXIS.   Levothyroxine Sodium (TIROSINT) 62.5 MCG CAPS Take 1 tablet by mouth daily before breakfast.   Polyethyl Glycol-Propyl Glycol (SYSTANE PRESERVATIVE FREE OP) Apply to eye.   triamcinolone ointment (KENALOG) 0.1 % triamcinolone acetonide 0.1 % topical ointment  APPLY A THIN LAYER TO THE AFFECTED vulvar AREA(S) BY TOPICAL ROUTE daily   venlafaxine XR (EFFEXOR XR) 75 MG 24 hr capsule Take 1 capsule (75 mg total) by mouth daily with breakfast.   mometasone (NASONEX) 50 MCG/ACT nasal spray Place 2 sprays into the nose daily. (Patient not taking: Reported on 08/14/2022)   No facility-administered encounter medications on file as of 08/14/2022.    Allergies (verified) Cortizone-10 [hydrocortisone], Other, Shellfish allergy, Synthroid [levothyroxine sodium], Azithromycin, Betadine [povidone iodine], Calcium channel blockers, Codeine, Duloxetine, Fluoride preparations, Iodine, Levaquin [levofloxacin in d5w], Lyrica [pregabalin], Novocain [procaine], Peanuts [peanut oil], Penicillins, Pfizerpen [penicillin g], Pollen extract, Prozac [fluoxetine hcl], Wellbutrin [bupropion], Lanolin, Molds & smuts, and Mushroom extract complex   History: Past Medical History:  Diagnosis Date   Abdominal pain, epigastric    Abdominal pain, epigastric    Abdominal pain, right lower quadrant    Abnormal weight gain    Abnormality of gait    Anxiety state, unspecified    Asymptomatic varicose veins    Blood vessel replaced by other means    Cramp of limb    Disorder of  bone and cartilage, unspecified    Dizziness and giddiness    Edema    Encounter for long-term (current) use of other medications    Herpes zoster without mention of complication    Herpes zoster without mention of complication    Hypersomnia, unspecified    Insomnia, unspecified    Long term (current) use of anticoagulants    Lumbago    Lumbago    Muscle weakness (generalized)    Myalgia and  myositis, unspecified    Obesity, unspecified    Optic neuritis    Other abnormal blood chemistry    Other and unspecified hyperlipidemia    Other cataract    Pain in joint, site unspecified    Pain in limb    Pain in limb    Palpitations    Phlebitis and thrombophlebitis of lower extremities, unspecified    Phlebitis and thrombophlebitis of other deep vessels of lower extremities    Rash and other nonspecific skin eruption    Spasm of muscle    Syncope and collapse    Tobacco use disorder    Unspecified essential hypertension    Unspecified hypothyroidism    Unspecified sinusitis (chronic)    Unspecified tinnitus    Past Surgical History:  Procedure Laterality Date   CATARACT EXTRACTION, BILATERAL     EYE SURGERY     zag laser procedure.   FINGER SURGERY Left 06/18/2020   [place] FILTER IMPLANT  2008   DR [facility]   HIP SURGERY LEFT  03/2010   DR [facility]   HIP SURGERY RIGHT  06/2010   DR [facility]   LEFT SHOULDER  2012   DR [facility]    LOWER BACK  05/26/2007   DR JEFF BEANE   RIGHT BREAST BIOPSY  1986   RIGHT KNEE  1971   Family History  Problem Relation Age of Onset   Stroke Mother    Stroke Father    Heart disease Father    Stroke Brother    Social History   Socioeconomic History   Marital status: Widowed    Spouse name: Leonette Most   Number of children: 0   Years of education: college   Highest education level: Not on file  Occupational History    Employer: [employer]    Comment: answers phones  Tobacco Use   Smoking status: Heavy Smoker    Packs/day: 1.50    Years: 52.00    Additional pack years: 0.00    Total pack years: 78.00    Types: Cigarettes   Smokeless tobacco: Never   Tobacco comments:    2 packs daily-01/24/2020  Vaping Use   Vaping Use: Never used  Substance and Sexual Activity   Alcohol use: Yes    Comment: occasionally   Drug use: No   Sexual activity: Not Currently    Partners: Male    Comment: husband   Other Topics Concern   Not on file  Social History Narrative   Patient  Lives at home wit her husband Leonette Most) . Patient still works.college education.   Right handed.   Caffiene -None   Social Determinants of Health   Financial Resource Strain: Low Risk  (08/14/2022)   Overall Financial Resource Strain (CARDIA)    Difficulty of Paying Living Expenses: Not hard at all  Food Insecurity: No Food Insecurity (08/14/2022)   Hunger Vital Sign    Worried About Running Out of Food in the Last Year: Never true    Ran Out  of Food in the Last Year: Never true  Transportation Needs: No Transportation Needs (08/14/2022)   PRAPARE - Administrator, Civil Service (Medical): No    Lack of Transportation (Non-Medical): No  Physical Activity: Inactive (08/14/2022)   Exercise Vital Sign    Days of Exercise per Week: 0 days    Minutes of Exercise per Session: 0 min  Stress: Stress Concern Present (08/14/2022)   [employer] of Occupational Health - Occupational Stress Questionnaire    Feeling of Stress : To some extent  Social Connections: Moderately Isolated (09/16/2021)   Social Connection and Isolation Panel [NHANES]    Frequency of Communication with Friends and Family: Three times a week    Frequency of Social Gatherings with Friends and Family: Three times a week    Attends Religious Services: 1 to 4 times per year    Active Member of Clubs or Organizations: No    Attends Banker Meetings: Never    Marital Status: Widowed    Tobacco Counseling Ready to quit: Yes Counseling given: Not Answered Tobacco comments: 2 packs daily-01/24/2020   Clinical Intake:  Pre-visit preparation completed: Yes  Pain : 0-10 Pain Score: 6  Pain Type: Acute pain Pain Location: Neck (head) Pain Descriptors / Indicators: Aching Pain Onset: In the past 7 days Pain Frequency: Constant     Nutritional Status: BMI 25 -29 Overweight Nutritional Risks: Nausea/ vomitting/  diarrhea (vomiting and diarrhea since missing medication) Diabetes: No  How often do you need to have someone help you when you read instructions, pamphlets, or other written materials from your doctor or pharmacy?: 1 - Never  Diabetic? no  Interpreter Needed?: No  Information entered by :: NAllen LPN   Activities of Daily Living    08/14/2022   11:36 AM 09/16/2021    1:16 PM  In your present state of health, do you have any difficulty performing the following activities:  Hearing? 1 0  Vision? 0 0  Difficulty concentrating or making decisions? 0 0  Walking or climbing stairs? 1 0  Dressing or bathing? 0 0  Doing errands, shopping? 0 0  Preparing Food and eating ? N N  Using the Toilet? N N  In the past six months, have you accidently leaked urine? Y N  Do you have problems with loss of bowel control? N N  Managing your Medications? N N  Managing your Finances? N N  Housekeeping or managing your Housekeeping? N N    Patient Care Team: Mliss Sax, MD as PCP - General (Family Medicine) Kathryne Hitch, MD as Attending Physician (Orthopedic Surgery) Dorisann Frames, MD as Consulting Physician (Endocrinology) Marlow Baars, MD as Consulting Physician (Gynecology)  Indicate any recent Medical Services you may have received from other than [facility] providers in the past year (date may be approximate).     Assessment:   This is a routine wellness examination for Shanyn.  Hearing/Vision screen Vision Screening - Comments:: Regular eye exams, [facility]  Dietary issues and exercise activities discussed: Current Exercise Habits: The patient does not participate in regular exercise at present   Goals Addressed             This Visit's Progress    Patient Stated       08/14/2022, quit smoking and lose weight       Depression Screen    08/14/2022   11:35 AM 07/21/2022    1:43 PM 03/17/2022    1:02  PM 09/16/2021    1:16 PM 09/16/2021    1:14 PM  09/03/2021   11:12 AM 08/19/2021   10:52 AM  PHQ 2/9 Scores  PHQ - 2 Score 0 0 0 0 0 0 0    Fall Risk    08/14/2022   11:35 AM 07/21/2022    1:43 PM 03/17/2022    1:02 PM 09/16/2021    1:15 PM 09/03/2021   11:12 AM  Fall Risk   Falls in the past year? 0 0 0 0 0  Number falls in past yr: 0 0 0 0 0  Injury with Fall? 0 0  0   Risk for fall due to : Medication side effect No Fall Risks     Follow up Falls prevention discussed;Education provided;Falls evaluation completed Falls evaluation completed  Falls evaluation completed     FALL RISK PREVENTION PERTAINING TO THE HOME:  Any stairs in or around the home? No  If so, are there any without handrails? N/a Home free of loose throw rugs in walkways, pet beds, electrical cords, etc? Yes  Adequate lighting in your home to reduce risk of falls? Yes   ASSISTIVE DEVICES UTILIZED TO PREVENT FALLS:  Life alert? No  Use of a cane, walker or w/c? No  Grab bars in the bathroom? Yes  Shower chair or bench in shower? Yes  Elevated toilet seat or a handicapped toilet? Yes   TIMED UP AND GO:  Was the test performed? No .      Cognitive Function:    06/24/2017    4:03 PM 02/26/2016    1:59 PM  MMSE - Mini Mental State Exam  Orientation to time 5 5  Orientation to Place 5 5  Registration 3 3  Attention/ Calculation 5 5  Recall 3 3  Language- name 2 objects 2 2  Language- repeat 1 1  Language- follow 3 step command 3 3  Language- read & follow direction 1 1  Write a sentence 1 1  Copy design 1 1  Total score 30 30        08/14/2022   11:38 AM  6CIT Screen  What Year? 0 points  What month? 0 points  What time? 0 points  Count back from 20 0 points  Months in reverse 0 points  Repeat phrase 0 points  Total Score 0 points    Immunizations Immunization History  Administered Date(s) Administered   Fluad Quad(high Dose 65+) 01/24/2019   Influenza Split 01/24/2014   Influenza, High Dose Seasonal PF 01/27/2018    Influenza,inj,Quad PF,6+ Mos 12/31/2015   Influenza-Unspecified 01/24/2013, 01/02/2014, 02/18/2015, 12/26/2016   Pneumococcal Conjugate-13 12/31/2015   Pneumococcal Polysaccharide-23 04/27/2012   Rabies, IM 05/02/2018, 05/05/2018, 05/09/2018, 05/16/2018   Td 05/25/1988   Tdap 02/03/2014   Zoster Recombinat (Shingrix) 01/06/2018, 04/11/2018   Zoster, Live 04/28/2010    TDAP status: Up to date  Flu Vaccine status: Up to date  Pneumococcal vaccine status: Up to date  Covid-19 vaccine status: Declined, Education has been provided regarding the importance of this vaccine but patient still declined. Advised may receive this vaccine at local pharmacy or Health Dept.or vaccine clinic. Aware to provide a copy of the vaccination record if obtained from local pharmacy or Health Dept. Verbalized acceptance and understanding.  Qualifies for Shingles Vaccine? Yes   Zostavax completed Yes   Shingrix Completed?: Yes  Screening Tests Health Maintenance  Topic Date Due   Fecal DNA (Cologuard)  10/03/2020   Lung Cancer  Screening  02/03/2023   PAP SMEAR-Modifier  05/21/2023   Medicare Annual Wellness (AWV)  08/14/2023   DTaP/Tdap/Td (3 - Td or Tdap) 02/04/2024   DEXA SCAN  Completed   Hepatitis C Screening  Completed   Zoster Vaccines- Shingrix  Completed   HPV VACCINES  Aged Out   Pneumonia Vaccine 65+ Years old  Discontinued   INFLUENZA VACCINE  Discontinued   MAMMOGRAM  Discontinued   COVID-19 Vaccine  Discontinued    Health Maintenance  Health Maintenance Due  Topic Date Due   Fecal DNA (Cologuard)  10/03/2020    Colorectal cancer screening: has a cologuard   Mammogram status: decline  Bone Density status: Completed 10/31/2019.   Lung Cancer Screening: (Low Dose CT Chest recommended if Age 105-80 years, 30 pack-year currently smoking OR have quit w/in 15years.) does qualify.   Lung Cancer Screening Referral:  CT scan 02/02/2022  Additional Screening:  Hepatitis C Screening:  does qualify; Completed 03/12/2015  Vision Screening: Recommended annual ophthalmology exams for early detection of glaucoma and other disorders of the eye. Is the patient up to date with their annual eye exam?  Yes  Who is the provider or what is the name of the office in which the patient attends annual eye exams? [facility] If pt is not established with a provider, would they like to be referred to a provider to establish care? No .   Dental Screening: Recommended annual dental exams for proper oral hygiene  Community Resource Referral / Chronic Care Management: CRR required this visit?  No   CCM required this visit?  No      Plan:     I have personally reviewed and noted the following in the patient's chart:   Medical and social history Use of alcohol, tobacco or illicit drugs  Current medications and supplements including opioid prescriptions. Patient is not currently taking opioid prescriptions. Functional ability and status Nutritional status Physical activity Advanced directives List of other physicians Hospitalizations, surgeries, and ER visits in previous 12 months Vitals Screenings to include cognitive, depression, and falls Referrals and appointments  In addition, I have reviewed and discussed with patient certain preventive protocols, quality metrics, and best practice recommendations. A written personalized care plan for preventive services as well as general preventive health recommendations were provided to patient.     Barb Merino, LPN   1/61/0960   Nurse Notes: none  Due to this being a virtual visit, the after visit summary with patients personalized plan was offered to patient via mail or my-chart. Patient would like to access on my-chart

## 2022-08-14 NOTE — Patient Instructions (Addendum)
[name] , Thank you for taking time to come for your Medicare Wellness Visit. I appreciate your ongoing commitment to your health goals. Please review the following plan we discussed and let me know if I can assist you in the future.   These are the goals we discussed:  Goals      Patient Stated     08/14/2022, quit smoking and lose weight     Quit smoking / using tobacco     Starting 02/26/16, I will attempt to quit smoking.        This is a list of the screening recommended for you and due dates:  Health Maintenance  Topic Date Due   Cologuard (Stool DNA test)  10/03/2020   Screening for Lung Cancer  02/03/2023   Pap Smear  05/21/2023   Medicare Annual Wellness Visit  08/14/2023   DTaP/Tdap/Td vaccine (3 - Td or Tdap) 02/04/2024   DEXA scan (bone density measurement)  Completed   Hepatitis C Screening: USPSTF Recommendation to screen - Ages 22-79 yo.  Completed   Zoster (Shingles) Vaccine  Completed   HPV Vaccine  Aged Out   Pneumonia Vaccine  Discontinued   Flu Shot  Discontinued   Mammogram  Discontinued   COVID-19 Vaccine  Discontinued    Advanced directives: Please bring a copy of your POA (Power of Attorney) and/or Living Will to your next appointment.    Conditions/risks identified: smoking  Next appointment: Follow up in one year for your annual wellness visit    Preventive Care 65 Years and Older, Female Preventive care refers to lifestyle choices and visits with your health care provider that can promote health and wellness. What does preventive care include? A yearly physical exam. This is also called an annual well check. Dental exams once or twice a year. Routine eye exams. Ask your health care provider how often you should have your eyes checked. Personal lifestyle choices, including: Daily care of your teeth and gums. Regular physical activity. Eating a healthy diet. Avoiding tobacco and drug use. Limiting alcohol use. Practicing safe sex. Taking  low-dose aspirin every day. Taking vitamin and mineral supplements as recommended by your health care provider. What happens during an annual well check? The services and screenings done by your health care provider during your annual well check will depend on your age, overall health, lifestyle risk factors, and family history of disease. Counseling  Your health care provider may ask you questions about your: Alcohol use. Tobacco use. Drug use. Emotional well-being. Home and relationship well-being. Sexual activity. Eating habits. History of falls. Memory and ability to understand (cognition). Work and work Astronomer. Reproductive health. Screening  You may have the following tests or measurements: Height, weight, and BMI. Blood pressure. Lipid and cholesterol levels. These may be checked every 5 years, or more frequently if you are over 38 years old. Skin check. Lung cancer screening. You may have this screening every year starting at age 68 if you have a 30-pack-year history of smoking and currently smoke or have quit within the past 15 years. Fecal occult blood test (FOBT) of the stool. You may have this test every year starting at age 61. Flexible sigmoidoscopy or colonoscopy. You may have a sigmoidoscopy every 5 years or a colonoscopy every 10 years starting at age 33. Hepatitis C blood test. Hepatitis B blood test. Sexually transmitted disease (STD) testing. Diabetes screening. This is done by checking your blood sugar (glucose) after you have not eaten for a  while (fasting). You may have this done every 1-3 years. Bone density scan. This is done to screen for osteoporosis. You may have this done starting at age 75. Mammogram. This may be done every 1-2 years. Talk to your health care provider about how often you should have regular mammograms. Talk with your health care provider about your test results, treatment options, and if necessary, the need for more tests. Vaccines   Your health care provider may recommend certain vaccines, such as: Influenza vaccine. This is recommended every year. Tetanus, diphtheria, and acellular pertussis (Tdap, Td) vaccine. You may need a Td booster every 10 years. Zoster vaccine. You may need this after age 67. Pneumococcal 13-valent conjugate (PCV13) vaccine. One dose is recommended after age 17. Pneumococcal polysaccharide (PPSV23) vaccine. One dose is recommended after age 15. Talk to your health care provider about which screenings and vaccines you need and how often you need them. This information is not intended to replace advice given to you by your health care provider. Make sure you discuss any questions you have with your health care provider. Document Released: 05/10/2015 Document Revised: 01/01/2016 Document Reviewed: 02/12/2015 Elsevier Interactive Patient Education  2017 [place] Prevention in the Home Falls can cause injuries. They can happen to people of all ages. There are many things you can do to make your home safe and to help prevent falls. What can I do on the outside of my home? Regularly fix the edges of walkways and driveways and fix any cracks. Remove anything that might make you trip as you walk through a door, such as a raised step or threshold. Trim any bushes or trees on the path to your home. Use bright outdoor lighting. Clear any walking paths of anything that might make someone trip, such as rocks or tools. Regularly check to see if handrails are loose or broken. Make sure that both sides of any steps have handrails. Any raised decks and porches should have guardrails on the edges. Have any leaves, snow, or ice cleared regularly. Use sand or salt on walking paths during winter. Clean up any spills in your garage right away. This includes oil or grease spills. What can I do in the bathroom? Use night lights. Install grab bars by the toilet and in the tub and shower. Do not use towel  bars as grab bars. Use non-skid mats or decals in the tub or shower. If you need to sit down in the shower, use a plastic, non-slip stool. Keep the floor dry. Clean up any water that spills on the floor as soon as it happens. Remove soap buildup in the tub or shower regularly. Attach bath mats securely with double-sided non-slip rug tape. Do not have throw rugs and other things on the floor that can make you trip. What can I do in the bedroom? Use night lights. Make sure that you have a light by your bed that is easy to reach. Do not use any sheets or blankets that are too big for your bed. They should not hang down onto the floor. Have a firm chair that has side arms. You can use this for support while you get dressed. Do not have throw rugs and other things on the floor that can make you trip. What can I do in the kitchen? Clean up any spills right away. Avoid walking on wet floors. Keep items that you use a lot in easy-to-reach places. If you need to reach something above you,  use a strong step stool that has a grab bar. Keep electrical cords out of the way. Do not use floor polish or wax that makes floors slippery. If you must use wax, use non-skid floor wax. Do not have throw rugs and other things on the floor that can make you trip. What can I do with my stairs? Do not leave any items on the stairs. Make sure that there are handrails on both sides of the stairs and use them. Fix handrails that are broken or loose. Make sure that handrails are as long as the stairways. Check any carpeting to make sure that it is firmly attached to the stairs. Fix any carpet that is loose or worn. Avoid having throw rugs at the top or bottom of the stairs. If you do have throw rugs, attach them to the floor with carpet tape. Make sure that you have a light switch at the top of the stairs and the bottom of the stairs. If you do not have them, ask someone to add them for you. What else can I do to help  prevent falls? Wear shoes that: Do not have high heels. Have rubber bottoms. Are comfortable and fit you well. Are closed at the toe. Do not wear sandals. If you use a stepladder: Make sure that it is fully opened. Do not climb a closed stepladder. Make sure that both sides of the stepladder are locked into place. Ask someone to hold it for you, if possible. Clearly mark and make sure that you can see: Any grab bars or handrails. First and last steps. Where the edge of each step is. Use tools that help you move around (mobility aids) if they are needed. These include: Canes. Walkers. Scooters. Crutches. Turn on the lights when you go into a dark area. Replace any light bulbs as soon as they burn out. Set up your furniture so you have a clear path. Avoid moving your furniture around. If any of your floors are uneven, fix them. If there are any pets around you, be aware of where they are. Review your medicines with your doctor. Some medicines can make you feel dizzy. This can increase your chance of falling. Ask your doctor what other things that you can do to help prevent falls. This information is not intended to replace advice given to you by your health care provider. Make sure you discuss any questions you have with your health care provider. Document Released: 02/07/2009 Document Revised: 09/19/2015 Document Reviewed: 05/18/2014 Elsevier Interactive Patient Education  2017 [employer].

## 2022-09-15 ENCOUNTER — Ambulatory Visit (INDEPENDENT_AMBULATORY_CARE_PROVIDER_SITE_OTHER): Payer: Medicare HMO | Admitting: Family Medicine

## 2022-09-15 ENCOUNTER — Encounter: Payer: Self-pay | Admitting: Family Medicine

## 2022-09-15 VITALS — BP 136/70 | HR 81 | Temp 98.4°F | Ht 67.0 in | Wt 175.6 lb

## 2022-09-15 DIAGNOSIS — E039 Hypothyroidism, unspecified: Secondary | ICD-10-CM

## 2022-09-15 DIAGNOSIS — F17210 Nicotine dependence, cigarettes, uncomplicated: Secondary | ICD-10-CM

## 2022-09-15 DIAGNOSIS — E559 Vitamin D deficiency, unspecified: Secondary | ICD-10-CM | POA: Diagnosis not present

## 2022-09-15 DIAGNOSIS — E538 Deficiency of other specified B group vitamins: Secondary | ICD-10-CM

## 2022-09-15 DIAGNOSIS — Z1211 Encounter for screening for malignant neoplasm of colon: Secondary | ICD-10-CM

## 2022-09-15 LAB — BASIC METABOLIC PANEL
BUN: 9 mg/dL (ref 6–23)
CO2: 27 mEq/L (ref 19–32)
Calcium: 9 mg/dL (ref 8.4–10.5)
Chloride: 104 mEq/L (ref 96–112)
Creatinine, Ser: 0.72 mg/dL (ref 0.40–1.20)
GFR: 83.37 mL/min (ref >60.00)
Glucose, Bld: 84 mg/dL (ref 70–99)
Potassium: 4.1 mEq/L (ref 3.5–5.1)
Sodium: 140 mEq/L (ref 135–145)

## 2022-09-15 LAB — VITAMIN D 25 HYDROXY (VIT D DEFICIENCY, FRACTURES): VITD: 40.57 ng/mL (ref 30.00–100.00)

## 2022-09-15 LAB — CBC
HCT: 44.3 % (ref 36.0–46.0)
Hemoglobin: 14.7 g/dL (ref 12.0–15.0)
MCHC: 33.2 g/dL (ref 30.0–36.0)
MCV: 86 fl (ref 78.0–100.0)
Platelets: 294 10*3/uL (ref 150.0–400.0)
RBC: 5.15 Mil/uL — ABNORMAL HIGH (ref 3.87–5.11)
RDW: 14.3 % (ref 11.5–15.5)
WBC: 8.7 10*3/uL (ref 4.0–10.5)

## 2022-09-15 LAB — VITAMIN B12: Vitamin B-12: 740 pg/mL (ref 211–911)

## 2022-09-15 LAB — TSH: TSH: 1.23 u[IU]/mL (ref 0.35–5.50)

## 2022-09-15 NOTE — Progress Notes (Signed)
Established Patient Office Visit   Subjective:  Patient ID: [name], female    DOB: 07-Jan-1951  Age: 72 y.o. MRN: [id number]  Chief Complaint  Patient presents with   Medical Management of Chronic Issues    2 month follow up on TSH, pt would like Vitamin D levels. Patient fasting.     HPI Encounter Diagnoses  Name Primary?   Vitamin D deficiency Yes   B12 deficiency    Cigarette nicotine dependence without complication    Hypothyroidism, unspecified type    Screening for colon cancer    For follow-up of above.  Continue supplementation with vitamin D3 and cyanocobalamin mean.  Status post decreased dose of Tirosint.  Perhaps there is less tremor?  Continues smoking a pack and a half daily.  Chantix it caused bad dreams.  She is taking venlafaxine for help with dizziness.  Continues neurology follow-up.   Review of Systems  Constitutional: Negative.   HENT: Negative.    Eyes:  Negative for blurred vision, discharge and redness.  Respiratory: Negative.    Cardiovascular: Negative.   Gastrointestinal:  Negative for abdominal pain.  Genitourinary: Negative.   Musculoskeletal: Negative.  Negative for myalgias.  Skin:  Negative for rash.  Neurological:  Positive for dizziness. Negative for tingling, loss of consciousness and weakness.  Endo/Heme/Allergies:  Negative for polydipsia.      09/15/2022    1:07 PM 08/14/2022   11:35 AM 07/21/2022    1:43 PM  Depression screen PHQ 2/9  Decreased Interest 0 0 0  Down, Depressed, Hopeless 0 0 0  PHQ - 2 Score 0 0 0      Current Outpatient Medications:    aspirin 325 MG tablet, Take 325 mg by mouth daily., Disp: , Rfl:    Cholecalciferol (VITAMIN D3 PO), Take 1 tablet by mouth daily., Disp: , Rfl:    cyanocobalamin (VITAMIN B12) 1000 MCG tablet, Take 1 tablet (1,000 mcg total) by mouth daily., Disp: 90 tablet, Rfl: 2   EPINEPHRINE 0.3 mg/0.3 mL IJ SOAJ injection, INJECT 0.3 MLS (0.3 MG TOTAL) INTO THE MUSCLE AS NEEDED FOR  ANAPHYLAXIS., Disp: 2 each, Rfl: 1   Levothyroxine Sodium (TIROSINT) 62.5 MCG CAPS, Take 1 tablet by mouth daily before breakfast., Disp: 90 capsule, Rfl: 0   mometasone (NASONEX) 50 MCG/ACT nasal spray, Place 2 sprays into the nose daily., Disp: 1 each, Rfl: 12   Polyethyl Glycol-Propyl Glycol (SYSTANE PRESERVATIVE FREE OP), Apply to eye., Disp: , Rfl:    triamcinolone ointment (KENALOG) 0.1 %, triamcinolone acetonide 0.1 % topical ointment  APPLY A THIN LAYER TO THE AFFECTED vulvar AREA(S) BY TOPICAL ROUTE daily, Disp: , Rfl:    venlafaxine XR (EFFEXOR XR) 75 MG 24 hr capsule, Take 1 capsule (75 mg total) by mouth daily with breakfast., Disp: 90 capsule, Rfl: 3   Objective:     BP 136/70 (BP Location: Right Arm, Patient Position: Sitting, Cuff Size: Normal)   Pulse 81   Temp 98.4 F (36.9 C) (Temporal)   Ht 5\' 7"  (1.702 m)   Wt 175 lb 9.6 oz (79.7 kg)   SpO2 95%   BMI 27.50 kg/m    Physical Exam Constitutional:      General: She is not in acute distress.    Appearance: Normal appearance. She is not ill-appearing, toxic-appearing or diaphoretic.  HENT:     Head: Normocephalic and atraumatic.     Right Ear: Tympanic membrane, ear canal and external ear normal.  Left Ear: Tympanic membrane, ear canal and external ear normal.     Mouth/Throat:     Mouth: Mucous membranes are moist.     Pharynx: Oropharynx is clear. No oropharyngeal exudate or posterior oropharyngeal erythema.  Eyes:     General: No scleral icterus.       Right eye: No discharge.        Left eye: No discharge.     Extraocular Movements: Extraocular movements intact.     Conjunctiva/sclera: Conjunctivae normal.     Pupils: Pupils are equal, round, and reactive to light.  Cardiovascular:     Rate and Rhythm: Normal rate and regular rhythm.  Pulmonary:     Effort: Pulmonary effort is normal. No respiratory distress.     Breath sounds: Normal breath sounds.  Musculoskeletal:     Cervical back: No rigidity or  tenderness.  Skin:    General: Skin is warm and dry.  Neurological:     Mental Status: She is alert and oriented to person, place, and time.  Psychiatric:        Mood and Affect: Mood normal.        Behavior: Behavior normal.      No results found for any visits on 09/15/22.    The 10-year ASCVD risk score (Arnett DK, et al., 2019) is: 20.3%    Assessment & Plan:   Vitamin D deficiency -     VITAMIN D 25 Hydroxy (Vit-D Deficiency, Fractures)  B12 deficiency -     Vitamin B12  Cigarette nicotine dependence without complication  Hypothyroidism, unspecified type -     CBC -     TSH -     Basic metabolic panel  Screening for colon cancer    Return in about 6 months (around 03/18/2023).    Mliss Sax, MD

## 2022-09-17 ENCOUNTER — Telehealth: Payer: Self-pay

## 2022-09-17 DIAGNOSIS — Z13820 Encounter for screening for osteoporosis: Secondary | ICD-10-CM

## 2022-09-17 NOTE — Telephone Encounter (Signed)
LVM for pt to call the office to get scheduled for mobile mammo.

## 2022-10-16 NOTE — Addendum Note (Signed)
Addended by: Larey Dresser on: 10/16/2022 02:25 PM   Modules accepted: Orders

## 2022-10-21 ENCOUNTER — Telehealth: Payer: Self-pay | Admitting: Family Medicine

## 2022-10-21 ENCOUNTER — Telehealth: Payer: Self-pay

## 2022-10-21 NOTE — Telephone Encounter (Signed)
Lmtrc to have Mammogram scheduled

## 2022-10-21 NOTE — Telephone Encounter (Signed)
Returned call patient declined mammogram

## 2022-10-21 NOTE — Telephone Encounter (Signed)
Pt returned a call from earlier.[name]

## 2022-10-22 ENCOUNTER — Ambulatory Visit: Payer: Medicare HMO | Admitting: Family Medicine

## 2022-10-23 ENCOUNTER — Telehealth: Payer: Self-pay | Admitting: Family Medicine

## 2022-10-23 DIAGNOSIS — E039 Hypothyroidism, unspecified: Secondary | ICD-10-CM

## 2022-10-23 MED ORDER — TIROSINT 62.5 MCG PO CAPS: 1.0000 | ORAL_CAPSULE | Freq: Every day | ORAL | 1 refills | Status: DC

## 2022-10-23 NOTE — Telephone Encounter (Signed)
Prescription Request  10/23/2022  LOV: 09/15/2022  What is the name of the medication or equipment? Levothyroxine Sodium (TIROSINT) 62.5 MCG CAPS [[id number]]   Have you contacted your pharmacy to request a refill? No   Which pharmacy would you like this sent to?  [facility] [address] [place], [place] - [address] AT [facility] [address] [place]. [place] [address] Phone: [phone] Fax: [phone]    Patient notified that their request is being sent to the clinical staff for review and that they should receive a response within 2 business days.   Please advise at Mobile [phone] (mobile)

## 2022-10-23 NOTE — Telephone Encounter (Signed)
Rx sent to the pharmacy and patient notified VIA phone.  Dm/cma  

## 2023-01-04 DIAGNOSIS — R051 Acute cough: Secondary | ICD-10-CM | POA: Diagnosis not present

## 2023-01-04 DIAGNOSIS — R0981 Nasal congestion: Secondary | ICD-10-CM | POA: Diagnosis not present

## 2023-01-04 DIAGNOSIS — J029 Acute pharyngitis, unspecified: Secondary | ICD-10-CM | POA: Diagnosis not present

## 2023-01-04 DIAGNOSIS — R070 Pain in throat: Secondary | ICD-10-CM | POA: Diagnosis not present

## 2023-01-04 DIAGNOSIS — R0982 Postnasal drip: Secondary | ICD-10-CM | POA: Diagnosis not present

## 2023-01-04 DIAGNOSIS — R6883 Chills (without fever): Secondary | ICD-10-CM | POA: Diagnosis not present

## 2023-01-04 DIAGNOSIS — J019 Acute sinusitis, unspecified: Secondary | ICD-10-CM | POA: Diagnosis not present

## 2023-01-21 DIAGNOSIS — D225 Melanocytic nevi of trunk: Secondary | ICD-10-CM | POA: Diagnosis not present

## 2023-01-21 DIAGNOSIS — D2239 Melanocytic nevi of other parts of face: Secondary | ICD-10-CM | POA: Diagnosis not present

## 2023-01-21 DIAGNOSIS — D485 Neoplasm of uncertain behavior of skin: Secondary | ICD-10-CM | POA: Diagnosis not present

## 2023-01-21 DIAGNOSIS — L821 Other seborrheic keratosis: Secondary | ICD-10-CM | POA: Diagnosis not present

## 2023-01-21 DIAGNOSIS — L738 Other specified follicular disorders: Secondary | ICD-10-CM | POA: Diagnosis not present

## 2023-01-21 DIAGNOSIS — D2261 Melanocytic nevi of right upper limb, including shoulder: Secondary | ICD-10-CM | POA: Diagnosis not present

## 2023-01-21 DIAGNOSIS — L578 Other skin changes due to chronic exposure to nonionizing radiation: Secondary | ICD-10-CM | POA: Diagnosis not present

## 2023-01-28 DIAGNOSIS — J029 Acute pharyngitis, unspecified: Secondary | ICD-10-CM | POA: Diagnosis not present

## 2023-01-28 DIAGNOSIS — B370 Candidal stomatitis: Secondary | ICD-10-CM | POA: Diagnosis not present

## 2023-02-04 ENCOUNTER — Inpatient Hospital Stay: Admission: RE | Admit: 2023-02-04 | Payer: Medicare HMO | Source: Ambulatory Visit

## 2023-03-11 DIAGNOSIS — H4720 Unspecified optic atrophy: Secondary | ICD-10-CM | POA: Diagnosis not present

## 2023-03-11 DIAGNOSIS — H26491 Other secondary cataract, right eye: Secondary | ICD-10-CM | POA: Diagnosis not present

## 2023-03-17 DIAGNOSIS — D0471 Carcinoma in situ of skin of right lower limb, including hip: Secondary | ICD-10-CM | POA: Diagnosis not present

## 2023-03-18 ENCOUNTER — Ambulatory Visit: Payer: Medicare HMO | Admitting: Family Medicine

## 2023-03-28 HISTORY — PX: SKIN CANCER EXCISION: SHX779

## 2023-03-30 ENCOUNTER — Encounter: Payer: Self-pay | Admitting: Family Medicine

## 2023-03-30 ENCOUNTER — Ambulatory Visit: Payer: Medicare HMO | Admitting: Family Medicine

## 2023-03-30 VITALS — BP 118/80 | HR 88 | Temp 97.3°F | Ht 67.0 in | Wt 178.6 lb

## 2023-03-30 DIAGNOSIS — E039 Hypothyroidism, unspecified: Secondary | ICD-10-CM | POA: Diagnosis not present

## 2023-03-30 DIAGNOSIS — F17210 Nicotine dependence, cigarettes, uncomplicated: Secondary | ICD-10-CM

## 2023-03-30 DIAGNOSIS — E7800 Pure hypercholesterolemia, unspecified: Secondary | ICD-10-CM

## 2023-03-30 NOTE — Progress Notes (Signed)
Established Patient Office Visit   Subjective:  Patient ID: [name], female    DOB: 05-Sep-1950  Age: 72 y.o. MRN: [id number]  Chief Complaint  Patient presents with   Medical Management of Chronic Issues    6 month follow up. Pt is not fasting.    HPI Encounter Diagnoses  Name Primary?   Hypothyroidism, unspecified type Yes   Elevated LDL cholesterol level    Cigarette nicotine dependence without complication    For follow-up of above.  Continues Tirosint for hypothyroidism.  History of elevated LDL with favorable HDL.  She is due for follow-up CT of the chest but has not called to schedule.  She has a Cologuard test at home that needs to be finished.  She refuses further mammograms because they invariably cause pain in her breast that lasts for months.  Continues to smoke and is not interested in quitting at this time.   Review of Systems  Constitutional: Negative.   HENT: Negative.    Eyes:  Negative for blurred vision, discharge and redness.  Respiratory: Negative.    Cardiovascular: Negative.   Gastrointestinal:  Negative for abdominal pain.  Genitourinary: Negative.   Musculoskeletal: Negative.  Negative for myalgias.  Skin:  Negative for rash.  Neurological:  Negative for tingling, loss of consciousness and weakness.  Endo/Heme/Allergies:  Negative for polydipsia.     Current Outpatient Medications:    aspirin 325 MG tablet, Take 325 mg by mouth daily., Disp: , Rfl:    Cholecalciferol (VITAMIN D3 PO), Take 1 tablet by mouth daily., Disp: , Rfl:    cyanocobalamin (VITAMIN B12) 1000 MCG tablet, Take 1 tablet (1,000 mcg total) by mouth daily., Disp: 90 tablet, Rfl: 2   EPINEPHRINE 0.3 mg/0.3 mL IJ SOAJ injection, INJECT 0.3 MLS (0.3 MG TOTAL) INTO THE MUSCLE AS NEEDED FOR ANAPHYLAXIS., Disp: 2 each, Rfl: 1   Levothyroxine Sodium (TIROSINT) 62.5 MCG CAPS, Take 1 tablet by mouth daily before breakfast., Disp: 90 capsule, Rfl: 1   mometasone (NASONEX) 50 MCG/ACT  nasal spray, Place 2 sprays into the nose daily., Disp: 1 each, Rfl: 12   Polyethyl Glycol-Propyl Glycol (SYSTANE PRESERVATIVE FREE OP), Apply to eye., Disp: , Rfl:    triamcinolone ointment (KENALOG) 0.1 %, triamcinolone acetonide 0.1 % topical ointment  APPLY A THIN LAYER TO THE AFFECTED vulvar AREA(S) BY TOPICAL ROUTE daily, Disp: , Rfl:    venlafaxine XR (EFFEXOR XR) 75 MG 24 hr capsule, Take 1 capsule (75 mg total) by mouth daily with breakfast., Disp: 90 capsule, Rfl: 3   Objective:     BP 118/80   Pulse 88   Temp (!) 97.3 F (36.3 C)   Ht 5\' 7"  (1.702 m)   Wt 178 lb 9.6 oz (81 kg)   SpO2 97%   BMI 27.97 kg/m    Physical Exam Constitutional:      General: She is not in acute distress.    Appearance: Normal appearance. She is not ill-appearing, toxic-appearing or diaphoretic.  HENT:     Head: Normocephalic and atraumatic.     Right Ear: External ear normal.     Left Ear: External ear normal.     Mouth/Throat:     Mouth: Mucous membranes are moist.     Pharynx: Oropharynx is clear. No oropharyngeal exudate or posterior oropharyngeal erythema.  Eyes:     General: No scleral icterus.       Right eye: No discharge.        Left  eye: No discharge.     Extraocular Movements: Extraocular movements intact.     Conjunctiva/sclera: Conjunctivae normal.     Pupils: Pupils are equal, round, and reactive to light.  Cardiovascular:     Rate and Rhythm: Normal rate and regular rhythm.  Pulmonary:     Effort: Pulmonary effort is normal. No respiratory distress.     Breath sounds: Normal breath sounds.  Abdominal:     General: Bowel sounds are normal.  Musculoskeletal:     Cervical back: No rigidity or tenderness.  Skin:    General: Skin is warm and dry.  Neurological:     Mental Status: She is alert and oriented to person, place, and time.  Psychiatric:        Mood and Affect: Mood normal.        Behavior: Behavior normal.      No results found for any visits on  03/30/23.    The 10-year ASCVD risk score (Arnett DK, et al., 2019) is: 17.1%    Assessment & Plan:   Hypothyroidism, unspecified type -     CBC with Differential/Platelet; Future -     TSH; Future  Elevated LDL cholesterol level -     Comprehensive metabolic panel; Future -     Lipid panel; Future  Cigarette nicotine dependence without complication    Return in about 6 months (around 09/28/2023), or Return fasting for above ordered blood work.  Reminded patient to schedule her screening CT of her chest.  She assures me that she will send her Cologuard test into the lab.  She refuses further mammograms.  Information was given on managing tobacco cessation.  Mliss Sax, MD

## 2023-03-31 ENCOUNTER — Other Ambulatory Visit: Payer: Medicare HMO

## 2023-03-31 DIAGNOSIS — E039 Hypothyroidism, unspecified: Secondary | ICD-10-CM

## 2023-03-31 DIAGNOSIS — E7800 Pure hypercholesterolemia, unspecified: Secondary | ICD-10-CM | POA: Diagnosis not present

## 2023-03-31 LAB — CBC WITH DIFFERENTIAL/PLATELET
Basophils Absolute: 0.2 10*3/uL — ABNORMAL HIGH (ref 0.0–0.1)
Basophils Relative: 1.5 % (ref 0.0–3.0)
Eosinophils Absolute: 0.3 10*3/uL (ref 0.0–0.7)
Eosinophils Relative: 3.1 % (ref 0.0–5.0)
HCT: 45.8 % (ref 36.0–46.0)
Hemoglobin: 15.1 g/dL — ABNORMAL HIGH (ref 12.0–15.0)
Lymphocytes Relative: 26.7 % (ref 12.0–46.0)
Lymphs Abs: 2.8 10*3/uL (ref 0.7–4.0)
MCHC: 32.9 g/dL (ref 30.0–36.0)
MCV: 86.8 fL (ref 78.0–100.0)
Monocytes Absolute: 0.8 10*3/uL (ref 0.1–1.0)
Monocytes Relative: 7.2 % (ref 3.0–12.0)
Neutro Abs: 6.5 10*3/uL (ref 1.4–7.7)
Neutrophils Relative %: 61.5 % (ref 43.0–77.0)
Platelets: 361 10*3/uL (ref 150.0–400.0)
RBC: 5.27 Mil/uL — ABNORMAL HIGH (ref 3.87–5.11)
RDW: 13.9 % (ref 11.5–15.5)
WBC: 10.6 10*3/uL — ABNORMAL HIGH (ref 4.0–10.5)

## 2023-03-31 LAB — COMPREHENSIVE METABOLIC PANEL
ALT: 10 U/L (ref 0–35)
AST: 13 U/L (ref 0–37)
Albumin: 4.2 g/dL (ref 3.5–5.2)
Alkaline Phosphatase: 90 U/L (ref 39–117)
BUN: 12 mg/dL (ref 6–23)
CO2: 29 meq/L (ref 19–32)
Calcium: 9.1 mg/dL (ref 8.4–10.5)
Chloride: 103 meq/L (ref 96–112)
Creatinine, Ser: 0.76 mg/dL (ref 0.40–1.20)
GFR: 77.84 mL/min (ref >60.00)
Glucose, Bld: 113 mg/dL — ABNORMAL HIGH (ref 70–99)
Potassium: 4 meq/L (ref 3.5–5.1)
Sodium: 140 meq/L (ref 135–145)
Total Bilirubin: 0.5 mg/dL (ref 0.2–1.2)
Total Protein: 7.1 g/dL (ref 6.0–8.3)

## 2023-03-31 LAB — LIPID PANEL
Cholesterol: 248 mg/dL — ABNORMAL HIGH (ref 0–200)
HDL: 63.8 mg/dL (ref >39.00)
LDL Cholesterol: 166 mg/dL — ABNORMAL HIGH (ref 0–99)
NonHDL: 184.17
Total CHOL/HDL Ratio: 4
Triglycerides: 93 mg/dL (ref 0.0–149.0)
VLDL: 18.6 mg/dL (ref 0.0–40.0)

## 2023-03-31 LAB — TSH: TSH: 1.53 u[IU]/mL (ref 0.35–5.50)

## 2023-05-04 ENCOUNTER — Telehealth: Payer: Self-pay

## 2023-05-04 ENCOUNTER — Other Ambulatory Visit: Payer: Self-pay | Admitting: Family Medicine

## 2023-05-04 DIAGNOSIS — E039 Hypothyroidism, unspecified: Secondary | ICD-10-CM

## 2023-05-04 MED ORDER — TIROSINT 62.5 MCG PO CAPS: 1.0000 | ORAL_CAPSULE | Freq: Every day | ORAL | 1 refills | Status: DC

## 2023-05-04 NOTE — Telephone Encounter (Signed)
 Copied from CRM [id number]. Topic: Clinical - Medication Refill >> May 04, 2023  8:57 AM Deaijah H wrote: Most Recent Primary Care Visit:  Provider: [facility]  Department: [facility]  Visit Type: LAB  Date: 03/31/2023  Medication: Levothyroxine Sodium  (TIROSINT ) 62.5 MCG CAPS  Has the patient contacted their pharmacy? No (Agent: If no, request that the patient contact the pharmacy for the refill. If patient does not wish to contact the pharmacy document the reason why and proceed with request.) (Agent: If yes, when and what did the pharmacy advise?) Due to it stating no refills  Is this the correct pharmacy for this prescription? Yes If no, delete pharmacy and type the correct one.  This is the patient's preferred pharmacy:  [id number] [place] - [place], [place] - [address] [place]. AT [facility] [address] [place]. [place] [address] Phone: [phone] Fax: [phone]   Has the prescription been filled recently? No  Is the patient out of the medication? Yes  Has the patient been seen for an appointment in the last year OR does the patient have an upcoming appointment?   Can we respond through MyChart?   Agent: Please be advised that Rx refills may take up to 3 business days. We ask that you follow-up with your pharmacy.

## 2023-05-04 NOTE — Telephone Encounter (Signed)
 Patient left vm on main line - advising that she needs a refill sent in of her rx and that she wants to check and make sure we still accept her insurance.  Called patient back to get more information - had to lvm requesting a call back

## 2023-05-04 NOTE — Telephone Encounter (Signed)
 Copied from CRM [id number]. Topic: Clinical - Medication Refill >> May 04, 2023  8:57 AM Deaijah H wrote: Most Recent Primary Care Visit:  Provider: [facility]  Department: [facility]  Visit Type: LAB  Date: 03/31/2023  Medication: Levothyroxine Sodium  (TIROSINT ) 62.5 MCG CAPS  Has the patient contacted their pharmacy? No (Agent: If no, request that the patient contact the pharmacy for the refill. If patient does not wish to contact the pharmacy document the reason why and proceed with request.) (Agent: If yes, when and what did the pharmacy advise?) Due to it stating no refills  Is this the correct pharmacy for this prescription? Yes If no, delete pharmacy and type the correct one.  This is the patient's preferred pharmacy:  [id number] [place] - [place], [place] - [address] [place]. AT [facility] [address] [place]. [place] [address] Phone: [phone] Fax: [phone]   Has the prescription been filled recently? No  Is the patient out of the medication? Yes  Has the patient been seen for an appointment in the last year OR does the patient have an upcoming appointment? Yes  Can we respond through MyChart? Yes  Agent: Please be advised that Rx refills may take up to 3 business days. We ask that you follow-up with your pharmacy.

## 2023-05-04 NOTE — Telephone Encounter (Signed)
RX sent to the pharmacy and patient notified VIA phone.  Dm/cma ° °

## 2023-05-05 ENCOUNTER — Telehealth: Payer: Self-pay | Admitting: Neurology

## 2023-05-05 MED ORDER — VENLAFAXINE HCL ER 75 MG PO CP24: 75.0000 mg | ORAL_CAPSULE | Freq: Every day | ORAL | 0 refills | Status: DC

## 2023-05-05 NOTE — Telephone Encounter (Signed)
 Pt called needing a refill on her venlafaxine XR (EFFEXOR XR) 75 MG 24 hr capsule and is needing it sent to the [facility] pharmacy. Pt only has 3 left and has scheduled an appt as well.

## 2023-05-05 NOTE — Telephone Encounter (Signed)
 refilled

## 2023-06-29 ENCOUNTER — Encounter: Payer: Self-pay | Admitting: Neurology

## 2023-06-29 ENCOUNTER — Ambulatory Visit: Payer: Medicare HMO | Admitting: Neurology

## 2023-06-29 VITALS — BP 154/88 | HR 83 | Wt 178.0 lb

## 2023-06-29 DIAGNOSIS — M47812 Spondylosis without myelopathy or radiculopathy, cervical region: Secondary | ICD-10-CM

## 2023-06-29 DIAGNOSIS — R251 Tremor, unspecified: Secondary | ICD-10-CM | POA: Diagnosis not present

## 2023-06-29 DIAGNOSIS — G4486 Cervicogenic headache: Secondary | ICD-10-CM | POA: Diagnosis not present

## 2023-06-29 MED ORDER — VENLAFAXINE HCL ER 37.5 MG PO CP24: 37.5000 mg | ORAL_CAPSULE | Freq: Every day | ORAL | 1 refills | Status: DC

## 2023-06-29 MED ORDER — PROPRANOLOL HCL 20 MG PO TABS: 20.0000 mg | ORAL_TABLET | Freq: Two times a day (BID) | ORAL | 6 refills | Status: AC | PRN
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
## 2023-07-19 ENCOUNTER — Encounter: Payer: Self-pay | Admitting: Neurology

## 2023-08-03 NOTE — Telephone Encounter (Signed)
 Pt called in and stated that she thought she was supposed to get off effexor but that there was a refill at the 37.5 and wants to know if she can just stop or if she is to refill. Pt stated that she had anxiety attack this am where she wanted to scream and cry, everything all at once and wants to know if its from stopping the 37.5 mg a few days ago. Sending to dr. Terrace Arabia to clarify

## 2023-08-06 NOTE — Telephone Encounter (Signed)
 I called and failed to reach patient, please call her again, check on her,

## 2023-08-10 MED ORDER — VENLAFAXINE HCL ER 37.5 MG PO CP24: 37.5000 mg | ORAL_CAPSULE | Freq: Every day | ORAL | 6 refills | Status: DC

## 2023-08-10 NOTE — Telephone Encounter (Signed)
 Let patient know I have sent in Effexor xr 37.5mg  daily to her pharmacy

## 2023-08-10 NOTE — Addendum Note (Signed)
 Addended by: Aniella Wandrey on: 08/10/2023 03:34 PM   Modules accepted: Orders

## 2023-09-01 ENCOUNTER — Encounter: Payer: Self-pay | Admitting: *Deleted

## 2023-09-28 ENCOUNTER — Ambulatory Visit: Payer: Medicare HMO | Admitting: Family Medicine

## 2023-09-30 DIAGNOSIS — D2239 Melanocytic nevi of other parts of face: Secondary | ICD-10-CM | POA: Diagnosis not present

## 2023-09-30 DIAGNOSIS — L578 Other skin changes due to chronic exposure to nonionizing radiation: Secondary | ICD-10-CM | POA: Diagnosis not present

## 2023-09-30 DIAGNOSIS — L570 Actinic keratosis: Secondary | ICD-10-CM | POA: Diagnosis not present

## 2023-09-30 DIAGNOSIS — D225 Melanocytic nevi of trunk: Secondary | ICD-10-CM | POA: Diagnosis not present

## 2023-09-30 DIAGNOSIS — L821 Other seborrheic keratosis: Secondary | ICD-10-CM | POA: Diagnosis not present

## 2023-09-30 DIAGNOSIS — L738 Other specified follicular disorders: Secondary | ICD-10-CM | POA: Diagnosis not present

## 2023-09-30 DIAGNOSIS — D2261 Melanocytic nevi of right upper limb, including shoulder: Secondary | ICD-10-CM | POA: Diagnosis not present

## 2023-10-05 DIAGNOSIS — Z1211 Encounter for screening for malignant neoplasm of colon: Secondary | ICD-10-CM | POA: Diagnosis not present

## 2023-10-15 ENCOUNTER — Telehealth: Payer: Self-pay

## 2023-10-15 NOTE — Telephone Encounter (Signed)
 Copied from CRM [id number]. Topic: Clinical - Request for Lab/Test Order >> Oct 14, 2023 12:19 PM Mesmerise C wrote: Reason for CRM: Chanai from [facility] to advise order was expired for cologuard and a new order needs to be sent

## 2023-10-20 ENCOUNTER — Other Ambulatory Visit: Payer: Self-pay | Admitting: Family Medicine

## 2023-10-20 DIAGNOSIS — Z1211 Encounter for screening for malignant neoplasm of colon: Secondary | ICD-10-CM

## 2023-10-24 LAB — COLOGUARD: COLOGUARD: NEGATIVE

## 2023-10-25 ENCOUNTER — Ambulatory Visit: Payer: Self-pay | Admitting: Family Medicine

## 2023-11-02 ENCOUNTER — Other Ambulatory Visit: Payer: Self-pay | Admitting: Family Medicine

## 2023-11-02 DIAGNOSIS — E039 Hypothyroidism, unspecified: Secondary | ICD-10-CM

## 2023-11-02 MED ORDER — TIROSINT 62.5 MCG PO CAPS: 62.5000 ug | ORAL_CAPSULE | Freq: Every morning | ORAL | 1 refills | Status: DC

## 2023-11-02 NOTE — Addendum Note (Signed)
 Addended by: BERNETA ELSIE LABOR on: 11/02/2023 04:39 PM   Modules accepted: Orders

## 2023-12-02 ENCOUNTER — Encounter: Payer: Self-pay | Admitting: Neurology

## 2023-12-03 ENCOUNTER — Encounter

## 2023-12-06 ENCOUNTER — Encounter: Payer: Self-pay | Admitting: Neurology

## 2023-12-07 MED ORDER — VENLAFAXINE HCL ER 37.5 MG PO CP24: 37.5000 mg | ORAL_CAPSULE | Freq: Every day | ORAL | 1 refills | Status: AC

## 2023-12-08 DIAGNOSIS — Z01419 Encounter for gynecological examination (general) (routine) without abnormal findings: Secondary | ICD-10-CM | POA: Diagnosis not present

## 2023-12-23 ENCOUNTER — Encounter: Payer: Self-pay | Admitting: Family Medicine

## 2024-01-28 ENCOUNTER — Ambulatory Visit: Admitting: Family Medicine

## 2024-01-28 ENCOUNTER — Encounter: Payer: Self-pay | Admitting: Family Medicine

## 2024-01-28 VITALS — BP 120/72 | HR 75 | Temp 96.2°F | Ht 67.0 in | Wt 178.8 lb

## 2024-01-28 DIAGNOSIS — T466X5A Adverse effect of antihyperlipidemic and antiarteriosclerotic drugs, initial encounter: Secondary | ICD-10-CM

## 2024-01-28 DIAGNOSIS — M791 Myalgia, unspecified site: Secondary | ICD-10-CM

## 2024-01-28 DIAGNOSIS — E7800 Pure hypercholesterolemia, unspecified: Secondary | ICD-10-CM

## 2024-01-28 DIAGNOSIS — H8309 Labyrinthitis, unspecified ear: Secondary | ICD-10-CM | POA: Diagnosis not present

## 2024-01-28 DIAGNOSIS — F17210 Nicotine dependence, cigarettes, uncomplicated: Secondary | ICD-10-CM | POA: Diagnosis not present

## 2024-01-28 DIAGNOSIS — E039 Hypothyroidism, unspecified: Secondary | ICD-10-CM | POA: Diagnosis not present

## 2024-01-28 LAB — URINALYSIS, ROUTINE W REFLEX MICROSCOPIC
Hgb urine dipstick: NEGATIVE
Nitrite: NEGATIVE
Specific Gravity, Urine: >=1.03 — AB (ref 1.000–1.030)
Total Protein, Urine: 30 — AB
Urine Glucose: 100 — AB
Urobilinogen, UA: 1 (ref 0.0–1.0)
pH: 5.5 (ref 5.0–8.0)

## 2024-01-28 LAB — CBC
HCT: 47 % — ABNORMAL HIGH (ref 36.0–46.0)
Hemoglobin: 15.6 g/dL — ABNORMAL HIGH (ref 12.0–15.0)
MCHC: 33.3 g/dL (ref 30.0–36.0)
MCV: 84.1 fl (ref 78.0–100.0)
Platelets: 307 K/uL (ref 150.0–400.0)
RBC: 5.58 Mil/uL — ABNORMAL HIGH (ref 3.87–5.11)
RDW: 13.5 % (ref 11.5–15.5)
WBC: 9.6 K/uL (ref 4.0–10.5)

## 2024-01-28 LAB — COMPREHENSIVE METABOLIC PANEL WITH GFR
ALT: 11 U/L (ref 0–35)
AST: 15 U/L (ref 0–37)
Albumin: 4.6 g/dL (ref 3.5–5.2)
Alkaline Phosphatase: 81 U/L (ref 39–117)
BUN: 14 mg/dL (ref 6–23)
CO2: 29 meq/L (ref 19–32)
Calcium: 9.6 mg/dL (ref 8.4–10.5)
Chloride: 102 meq/L (ref 96–112)
Creatinine, Ser: 0.85 mg/dL (ref 0.40–1.20)
GFR: 67.66 mL/min (ref >60.00)
Glucose, Bld: 105 mg/dL — ABNORMAL HIGH (ref 70–99)
Potassium: 4.4 meq/L (ref 3.5–5.1)
Sodium: 139 meq/L (ref 135–145)
Total Bilirubin: 0.6 mg/dL (ref 0.2–1.2)
Total Protein: 7.4 g/dL (ref 6.0–8.3)

## 2024-01-28 LAB — TSH: TSH: 1.03 u[IU]/mL (ref 0.35–5.50)

## 2024-01-28 LAB — MAGNESIUM: Magnesium: 2.2 mg/dL (ref 1.5–2.5)

## 2024-01-28 NOTE — Progress Notes (Addendum)
 Yeah nausea R UN Ms. Emergency planning/management officer  Established Patient Office Visit   Subjective:  Patient ID: [name], female    DOB: 1950-11-06  Age: 73 y.o. MRN: [id number]  Chief Complaint  Patient presents with   Medication Management    Pt presents for medication management for Levothyroxine Sodium  (TIROSINT ) 62.5 MCG CAPS. Pt wants to discuss her magnesium count through her blood, also wants to discuss if her blood is clotting too much for it to be dangerous      HPI Encounter Diagnoses  Name Primary?   Hypothyroidism, unspecified type Yes   Elevated LDL cholesterol level    Cigarette nicotine  dependence without complication    Myalgia due to statin    Labyrinthitis, unspecified laterality    For follow-up of above.  Ongoing issues with labyrinthitis.  She had hoped it would pass but it continues to bother her on a regular basis.  She does confirm experiencing a spinning sensation.  She does have chronic rhinorrhea and postnasal drip.  She does have a nasal steroid at home but has not been using it.  She understands that her LDL cholesterol is high along with her ASCVD risk score.  She simply will not take a statin.  She has experienced myalgias with them in the past and is not interested in pursuing treatment.  She has been trying to quit smoking and has quit 3 times in the last 2 months, she tells me.  Continues to take her levothyroxine daily.  She is interested in having her magnesium level checked   Review of Systems  Constitutional: Negative.   HENT: Negative.    Eyes:  Negative for blurred vision, discharge and redness.  Respiratory: Negative.    Cardiovascular: Negative.   Gastrointestinal:  Negative for abdominal pain.  Genitourinary: Negative.   Musculoskeletal: Negative.  Negative for myalgias.  Skin:  Negative for rash.  Neurological:  Positive for dizziness. Negative for tingling, loss of consciousness and weakness.  Endo/Heme/Allergies:  Negative for polydipsia.      Current Outpatient Medications:    aspirin 325 MG tablet, Take 325 mg by mouth daily., Disp: , Rfl:    Cholecalciferol (VITAMIN D3 PO), Take 1 tablet by mouth daily., Disp: , Rfl:    cyanocobalamin  (VITAMIN B12) 1000 MCG tablet, Take 1 tablet (1,000 mcg total) by mouth daily., Disp: 90 tablet, Rfl: 2   EPINEPHRINE  0.3 mg/0.3 mL IJ SOAJ injection, INJECT 0.3 MLS (0.3 MG TOTAL) INTO THE MUSCLE AS NEEDED FOR ANAPHYLAXIS., Disp: 2 each, Rfl: 1   mometasone  (NASONEX ) 50 MCG/ACT nasal spray, Place 2 sprays into the nose daily., Disp: 1 each, Rfl: 12   Polyethyl Glycol-Propyl Glycol (SYSTANE PRESERVATIVE FREE OP), Apply to eye., Disp: , Rfl:    propranolol  (INDERAL ) 20 MG tablet, Take 1 tablet (20 mg total) by mouth 2 (two) times daily as needed., Disp: 60 tablet, Rfl: 6   triamcinolone ointment (KENALOG) 0.1 %, triamcinolone acetonide 0.1 % topical ointment  APPLY A THIN LAYER TO THE AFFECTED vulvar AREA(S) BY TOPICAL ROUTE daily, Disp: , Rfl:    venlafaxine  XR (EFFEXOR  XR) 37.5 MG 24 hr capsule, Take 1 capsule (37.5 mg total) by mouth daily with breakfast., Disp: 90 capsule, Rfl: 1   Levothyroxine Sodium  (TIROSINT ) 62.5 MCG CAPS, Take 62.5 mcg by mouth every morning., Disp: 90 capsule, Rfl: 2   Objective:     BP 120/72 (BP Location: Right Arm, Patient Position: Sitting)   Pulse 75   Temp (!) 96.2  F (35.7 C) (Temporal)   Ht 5' 7 (1.702 m)   Wt 178 lb 12.8 oz (81.1 kg)   SpO2 96%   BMI 28.00 kg/m    Physical Exam Constitutional:      General: She is not in acute distress.    Appearance: Normal appearance. She is not ill-appearing, toxic-appearing or diaphoretic.  HENT:     Head: Normocephalic and atraumatic.     Right Ear: Tympanic membrane, ear canal and external ear normal.     Left Ear: Tympanic membrane, ear canal and external ear normal.     Mouth/Throat:     Mouth: Mucous membranes are moist.     Pharynx: Oropharynx is clear. No oropharyngeal exudate or posterior  oropharyngeal erythema.  Eyes:     General: No scleral icterus.       Right eye: No discharge.        Left eye: No discharge.     Extraocular Movements: Extraocular movements intact.     Conjunctiva/sclera: Conjunctivae normal.     Pupils: Pupils are equal, round, and reactive to light.  Cardiovascular:     Rate and Rhythm: Normal rate and regular rhythm.  Pulmonary:     Effort: Pulmonary effort is normal. No respiratory distress.     Breath sounds: Normal breath sounds. No wheezing, rhonchi or rales.  Abdominal:     General: Bowel sounds are normal.  Musculoskeletal:     Cervical back: No rigidity or tenderness.  Skin:    General: Skin is warm and dry.  Neurological:     Mental Status: She is alert and oriented to person, place, and time.  Psychiatric:        Mood and Affect: Mood normal.        Behavior: Behavior normal.      Results for orders placed or performed in visit on 01/28/24  CBC  Result Value Ref Range   WBC 9.6 4.0 - 10.5 K/uL   RBC 5.58 (H) 3.87 - 5.11 Mil/uL   Platelets 307.0 150.0 - 400.0 K/uL   Hemoglobin 15.6 (H) 12.0 - 15.0 g/dL   HCT 52.9 (H) 63.9 - 53.9 %   MCV 84.1 78.0 - 100.0 fl   MCHC 33.3 30.0 - 36.0 g/dL   RDW 86.4 88.4 - 84.4 %  Comprehensive metabolic panel with GFR  Result Value Ref Range   Sodium 139 135 - 145 mEq/L   Potassium 4.4 3.5 - 5.1 mEq/L   Chloride 102 96 - 112 mEq/L   CO2 29 19 - 32 mEq/L   Glucose, Bld 105 (H) 70 - 99 mg/dL   BUN 14 6 - 23 mg/dL   Creatinine, Ser 9.14 0.40 - 1.20 mg/dL   Total Bilirubin 0.6 0.2 - 1.2 mg/dL   Alkaline Phosphatase 81 39 - 117 U/L   AST 15 0 - 37 U/L   ALT 11 0 - 35 U/L   Total Protein 7.4 6.0 - 8.3 g/dL   Albumin 4.6 3.5 - 5.2 g/dL   GFR 32.33 >39.99 mL/min   Calcium 9.6 8.4 - 10.5 mg/dL  TSH  Result Value Ref Range   TSH 1.03 0.35 - 5.50 uIU/mL  Urinalysis, Routine w reflex microscopic  Result Value Ref Range   Color, Urine YELLOW Yellow;Lt. Yellow;Straw;Dark  Yellow;Amber;Green;Red;Brown   APPearance CLEAR Clear;Turbid;Slightly Cloudy;Cloudy   Specific Gravity, Urine >=1.030 (A) 1.000 - 1.030   pH 5.5 5.0 - 8.0   Total Protein, Urine 30 (A) Negative   Urine Glucose  100 (A) Negative   Ketones, ur TRACE (A) Negative   Bilirubin Urine SMALL (A) Negative   Hgb urine dipstick NEGATIVE Negative   Urobilinogen, UA 1.0 0.0 - 1.0   Leukocytes,Ua TRACE (A) Negative   Nitrite NEGATIVE Negative   WBC, UA 3-6/hpf (A) 0-2/hpf   RBC / HPF 0-2/hpf 0-2/hpf   Squamous Epithelial / HPF Rare(0-4/hpf) Rare(0-4/hpf)   Bacteria, UA Many(>50/hpf) (A) None  Magnesium  Result Value Ref Range   Magnesium 2.2 1.5 - 2.5 mg/dL      The 89-bzjm ASCVD risk score (Arnett DK, et al., 2019) is: 24.9%    Assessment & Plan:   Hypothyroidism, unspecified type -     CBC -     TSH -     Magnesium -     Tirosint ; Take 62.5 mcg by mouth every morning.  Dispense: 90 capsule; Refill: 2  Elevated LDL cholesterol level -     Comprehensive metabolic panel with GFR  Cigarette nicotine  dependence without complication -     Ambulatory Referral for Lung Cancer Scre  Myalgia due to statin  Labyrinthitis, unspecified laterality -     CBC -     Urinalysis, Routine w reflex microscopic -     Ambulatory referral to ENT -     Ambulatory referral to Physical Therapy    Return in about 6 months (around 07/28/2024), or if symptoms worsen or fail to improve.   Have referred to ENT and Pt for vertigo.  Recommended Zetia for elevated cholesterol.  Advised that it would not have statin side effects.  She is not interested in treating her elevated cholesterol.  Information was given on heart disease in women.  Information given on managing elevated cholesterol.  Encouraged ongoing smoking cessation efforts.  Referred for lung cancer screening.   Elsie Sim Lent, MD

## 2024-01-31 ENCOUNTER — Ambulatory Visit: Payer: Self-pay | Admitting: Family Medicine

## 2024-01-31 ENCOUNTER — Telehealth: Payer: Self-pay

## 2024-01-31 MED ORDER — TIROSINT 62.5 MCG PO CAPS: 62.5000 ug | ORAL_CAPSULE | Freq: Every morning | ORAL | 2 refills | Status: AC

## 2024-01-31 NOTE — Telephone Encounter (Signed)
 Fax with supporting documents faxed to [phone]: Waiting for fax confirmation.

## 2024-01-31 NOTE — Telephone Encounter (Signed)
 Received fax confirmation

## 2024-01-31 NOTE — Telephone Encounter (Signed)
 Spoke with patient, attempted to schedule annual Lung CT. Additional referral received. Pt states her insurance will only cover 1 scan every 3 years.   Silvano, will you double check this for the patient? She is overdue for an annual LDCT if they will cover it.   Isaiah, RN

## 2024-01-31 NOTE — Addendum Note (Signed)
 Addended by: BERNETA ELSIE LABOR on: 01/31/2024 08:05 AM   Modules accepted: Orders

## 2024-02-03 NOTE — Telephone Encounter (Signed)
 PA still pending.

## 2024-02-09 NOTE — Telephone Encounter (Signed)
 PA still pending.

## 2024-02-11 NOTE — Telephone Encounter (Signed)
 PA PENDING

## 2024-02-17 ENCOUNTER — Ambulatory Visit: Admitting: Family Medicine

## 2024-02-22 NOTE — Telephone Encounter (Addendum)
 Lindsey J. with [employer] advised revious pre-determination was canceled without notification stating PreD was initiated in error. Per guidelines set up by pt's insurance, pt can get this prevenntative LCS CT once every 365 days (1 yr). Ref# [id number]

## 2024-02-22 NOTE — Telephone Encounter (Signed)
 [name].  I was able to get this authorized.

## 2024-02-23 ENCOUNTER — Other Ambulatory Visit: Payer: Self-pay | Admitting: *Deleted

## 2024-02-23 DIAGNOSIS — Z122 Encounter for screening for malignant neoplasm of respiratory organs: Secondary | ICD-10-CM

## 2024-02-23 DIAGNOSIS — Z87891 Personal history of nicotine dependence: Secondary | ICD-10-CM

## 2024-03-02 ENCOUNTER — Ambulatory Visit
Admission: RE | Admit: 2024-03-02 | Discharge: 2024-03-02 | Disposition: A | Discharge status: Home / Self Care | Source: Ambulatory Visit | Attending: Acute Care | Admitting: Acute Care

## 2024-03-02 DIAGNOSIS — Z122 Encounter for screening for malignant neoplasm of respiratory organs: Secondary | ICD-10-CM

## 2024-03-02 DIAGNOSIS — Z87891 Personal history of nicotine dependence: Secondary | ICD-10-CM

## 2024-03-02 DIAGNOSIS — F17210 Nicotine dependence, cigarettes, uncomplicated: Secondary | ICD-10-CM | POA: Diagnosis not present

## 2024-03-08 ENCOUNTER — Other Ambulatory Visit: Payer: Self-pay

## 2024-03-08 DIAGNOSIS — Z122 Encounter for screening for malignant neoplasm of respiratory organs: Secondary | ICD-10-CM

## 2024-03-08 DIAGNOSIS — F17210 Nicotine dependence, cigarettes, uncomplicated: Secondary | ICD-10-CM

## 2024-03-08 DIAGNOSIS — Z87891 Personal history of nicotine dependence: Secondary | ICD-10-CM

## 2024-03-09 DIAGNOSIS — N3946 Mixed incontinence: Secondary | ICD-10-CM | POA: Diagnosis not present

## 2024-03-09 DIAGNOSIS — R399 Unspecified symptoms and signs involving the genitourinary system: Secondary | ICD-10-CM | POA: Diagnosis not present

## 2024-03-09 DIAGNOSIS — R351 Nocturia: Secondary | ICD-10-CM | POA: Diagnosis not present

## 2024-03-09 DIAGNOSIS — R350 Frequency of micturition: Secondary | ICD-10-CM | POA: Diagnosis not present

## 2024-03-16 DIAGNOSIS — H4720 Unspecified optic atrophy: Secondary | ICD-10-CM | POA: Diagnosis not present

## 2024-03-16 DIAGNOSIS — H26491 Other secondary cataract, right eye: Secondary | ICD-10-CM | POA: Diagnosis not present

## 2024-04-03 DIAGNOSIS — N3946 Mixed incontinence: Secondary | ICD-10-CM | POA: Diagnosis not present

## 2024-04-06 DIAGNOSIS — D2261 Melanocytic nevi of right upper limb, including shoulder: Secondary | ICD-10-CM | POA: Diagnosis not present

## 2024-04-06 DIAGNOSIS — L570 Actinic keratosis: Secondary | ICD-10-CM | POA: Diagnosis not present

## 2024-04-06 DIAGNOSIS — L578 Other skin changes due to chronic exposure to nonionizing radiation: Secondary | ICD-10-CM | POA: Diagnosis not present

## 2024-04-06 DIAGNOSIS — L738 Other specified follicular disorders: Secondary | ICD-10-CM | POA: Diagnosis not present

## 2024-04-06 DIAGNOSIS — D225 Melanocytic nevi of trunk: Secondary | ICD-10-CM | POA: Diagnosis not present

## 2024-04-06 DIAGNOSIS — D2239 Melanocytic nevi of other parts of face: Secondary | ICD-10-CM | POA: Diagnosis not present

## 2024-04-06 DIAGNOSIS — L309 Dermatitis, unspecified: Secondary | ICD-10-CM | POA: Diagnosis not present

## 2024-04-19 ENCOUNTER — Encounter (INDEPENDENT_AMBULATORY_CARE_PROVIDER_SITE_OTHER): Payer: Self-pay

## 2024-04-25 ENCOUNTER — Telehealth: Payer: Self-pay

## 2024-04-25 NOTE — Telephone Encounter (Signed)
 Copied from CRM [id number]. Topic: Referral - Question >> Apr 25, 2024 10:28 AM Revonda D wrote: Reason for CRM: Pt stated that her knees went out over the weekend and she was unable to walk. Pt stated that she has improved a little since then but wants to know if she can get a referral for a xray scan or if she needs to see Dr.Kremer first. Pt would like a callback with an update.    ----------------------------------------------------------------------- From previous Reason for Contact - Medical Advice: Reason for CRM:

## 2024-04-25 NOTE — Telephone Encounter (Signed)
 Please see message below LOV: 01/28/2024

## 2024-04-28 ENCOUNTER — Institutional Professional Consult (permissible substitution) (INDEPENDENT_AMBULATORY_CARE_PROVIDER_SITE_OTHER)

## 2024-05-01 ENCOUNTER — Ambulatory Visit

## 2024-05-01 ENCOUNTER — Ambulatory Visit: Payer: Self-pay

## 2024-05-01 ENCOUNTER — Encounter: Payer: Self-pay | Admitting: Nurse Practitioner

## 2024-05-01 VITALS — BP 128/64 | HR 89 | Temp 97.3°F | Ht 67.0 in

## 2024-05-01 DIAGNOSIS — M25562 Pain in left knee: Secondary | ICD-10-CM

## 2024-05-01 DIAGNOSIS — M25561 Pain in right knee: Secondary | ICD-10-CM

## 2024-05-01 DIAGNOSIS — G8929 Other chronic pain: Secondary | ICD-10-CM | POA: Insufficient documentation

## 2024-05-01 MED ORDER — MELOXICAM 7.5 MG PO TABS: 7.5000 mg | ORAL_TABLET | Freq: Every day | ORAL | 0 refills | Status: AC

## 2024-05-01 NOTE — Telephone Encounter (Signed)
 FYI Only or Action Required?: Action required by provider: request for appointment.  Patient was last seen in primary care on 01/28/2024 by Berneta Elsie Sayre, MD.  Called Nurse Triage reporting Knee Pain.  Symptoms began several weeks ago.  Interventions attempted: Rest, hydration, or home remedies.  Symptoms are: unchanged.Bilateral knee pain.  Triage Disposition: No disposition on file.  Patient/caregiver understands and will follow disposition?:        Copied from CRM [id number]. Topic: Clinical - Red Word Triage >> May 01, 2024  7:41 AM Macario HERO wrote: Red Word that prompted transfer to Nurse Triage: Patient called said her knees went out a few weeks ago and then a week after her left leg went out and the following week both legs went out and she spent several days on the floor. She called in last week looking to see what she should do but nobody called her back. Answer Assessment - Initial Assessment Questions 1. LOCATION and RADIATION: Where is the pain located?      Both knees 2. QUALITY: What does the pain feel like?  (e.g., sharp, dull, aching, burning)     Sharp, burning 3. SEVERITY: How bad is the pain? What does it keep you from doing?   (Scale 1-10; or mild, moderate, severe)     severe 4. ONSET: When did the pain start? Does it come and go, or is it there all the time?     3 weeks 5. RECURRENT: Have you had this pain before? If Yes, ask: When, and what happened then?     yes 6. SETTING: Has there been any recent work, exercise or other activity that involved that part of the body?      no 7. AGGRAVATING FACTORS: What makes the knee pain worse? (e.g., walking, climbing stairs, running)     walking 8. ASSOCIATED SYMPTOMS: Is there any swelling or redness of the knee?     yes 9. OTHER SYMPTOMS: Do you have any other symptoms? (e.g., calf pain, chest pain, difficulty breathing, fever)     no 10. PREGNANCY: Is there any chance you are  pregnant? When was your last menstrual period?       no  Protocols used: Knee Pain-A-AH

## 2024-05-01 NOTE — Telephone Encounter (Signed)
 Appointment today @ 4 pm with Tinnie. Dm/cma

## 2024-05-01 NOTE — Progress Notes (Signed)
 "  Acute Office Visit  Subjective:     Patient ID: [name], female    DOB: [birth year], 74 y.o.   MRN: [id number]  Chief Complaint  Patient presents with   Knee Pain    Bilateral knee pain for 1 month with some swelling    HPI Discussed the use of AI scribe software for clinical note transcription with the patient, who gave verbal consent to proceed.  History of Present Illness   [name] is a 74 year old female who presents with severe bilateral knee pain.  She reports 1 month of severe bilateral knee pain, described as twenty out of ten with movement, especially when rising from a chair. Pain is located at the back of both knees with radiation up the legs and sometimes into the calves. She denies recent falls or injuries.  She previously fractured both kneecaps in the 1970s and had surgery on one knee.  She notes intermittent swelling of both knees, with the right swelling first then the left a week later, appearing markedly enlarged but resolving overnight.  Two weeks ago she was unable to rise from a chair for 4 days because of knee pain and weakness, which limited access to food and water. She since then has been ambulating with a walker. She lives alone.   Ibuprofen has not helped. She is not taking any current medication for this pain and has not seen an orthopedic specialist recently.  She has pain with movement and feels her legs are weak. She needs to use her arms to stand.  She lives alone and has made home adjustments, such as moving phones to more accessible locations, to reduce risk during future episodes of immobility.     ROS See pertinent positives and negatives per HPI.     Objective:    BP 128/64 (BP Location: Left Arm, Patient Position: Sitting, Cuff Size: Normal)   Pulse 89   Temp (!) 97.3 F (36.3 C)   Ht 5' 7 (1.702 m)   SpO2 96%   BMI 28.00 kg/m    Physical Exam Vitals and nursing note reviewed.  Constitutional:       General: She is not in acute distress.    Appearance: Normal appearance.  HENT:     Head: Normocephalic.  Eyes:     Conjunctiva/sclera: Conjunctivae normal.  Cardiovascular:     Rate and Rhythm: Normal rate and regular rhythm.     Pulses: Normal pulses.     Heart sounds: Normal heart sounds.  Pulmonary:     Effort: Pulmonary effort is normal.     Breath sounds: Normal breath sounds.  Musculoskeletal:        General: No swelling or tenderness.     Cervical back: Normal range of motion.     Comments: Pain in bilateral knees with ROM  Skin:    General: Skin is warm.  Neurological:     General: No focal deficit present.     Mental Status: She is alert and oriented to person, place, and time.  Psychiatric:        Mood and Affect: Mood normal.        Behavior: Behavior normal.        Thought Content: Thought content normal.        Judgment: Judgment normal.      Assessment & Plan:   Problem List Items Addressed This Visit       Other   Chronic pain of both  knees - Primary   Chronic bilateral knee pain is more severe in the left knee and worsens with movement and prolonged sitting, with pain rated at 20/10. She has a history of right patellar fracture and surgery. Arthritis is suspected. Ibuprofen has been ineffective, and there has been no recent orthopedic evaluation. Knee x-rays have been ordered. Start meloxicam  7.5mg , one tablet daily with food, for pain management. Use ice on the knees for 10 minutes, three times a day. She is referred to orthopedics for further evaluation. Continue using a walker for stability. Consider Life Alert or a similar service for emergency assistance.       Relevant Medications   meloxicam  (MOBIC ) 7.5 MG tablet   Other Relevant Orders   DG Knee Complete 4 Views Right   DG Knee Complete 4 Views Left   Ambulatory referral to Orthopedic Surgery    Meds ordered this encounter  Medications   meloxicam  (MOBIC ) 7.5 MG tablet    Sig: Take 1 tablet  (7.5 mg total) by mouth daily.    Dispense:  30 tablet    Refill:  0    Return if symptoms worsen or fail to improve.  Tinnie DELENA Harada, NP   "

## 2024-05-01 NOTE — Telephone Encounter (Signed)
 Pt scheduled for today at 4pm

## 2024-05-01 NOTE — Patient Instructions (Addendum)
 It was great to see you!  Start meloxicam  1 tablet daily with food for pain  Start using ice for 10 minutes 3 times a day to your knees   I have place a referral to ortho   I would recommend you look into life alert   Let's follow-up if symptoms worsen or any concerns   Take care,  Tinnie Harada, NP

## 2024-05-01 NOTE — Assessment & Plan Note (Signed)
 Chronic bilateral knee pain is more severe in the left knee and worsens with movement and prolonged sitting, with pain rated at 20/10. She has a history of right patellar fracture and surgery. Arthritis is suspected. Ibuprofen has been ineffective, and there has been no recent orthopedic evaluation. Knee x-rays have been ordered. Start meloxicam  7.5mg , one tablet daily with food, for pain management. Use ice on the knees for 10 minutes, three times a day. She is referred to orthopedics for further evaluation. Continue using a walker for stability. Consider Life Alert or a similar service for emergency assistance.

## 2024-05-10 ENCOUNTER — Ambulatory Visit: Payer: Self-pay | Admitting: Nurse Practitioner

## 2024-05-15 ENCOUNTER — Telehealth: Payer: Self-pay

## 2024-05-15 NOTE — Telephone Encounter (Signed)
 Copied from CRM #[id number]. Topic: Clinical - Medication Question >> May 15, 2024  8:46 AM Viola F wrote: Reason for CRM: Patient wants to know if she can have 90 day of the Levothyroxine Sodium  (TIROSINT ) 62.5 MCG CAPS for her next refill so the cost will be cheaper.  Patient notified VIA phone that RX was sent with #[phone] rf in 01/2024.  She will call the pharmacy to check on this since she just picked up the RX and was only given #30. Dm/cma

## 2024-05-16 NOTE — Progress Notes (Signed)
 [name]                                          MRN: [id number]   05/16/2024   The [facility] Quality Team Specialist reviewed this patient medical record for the purposes of chart review for care gap closure. The following were reviewed: chart review for care gap closure-breast cancer screening.    [facility] Quality Team

## 2024-05-18 ENCOUNTER — Ambulatory Visit: Admitting: Sports Medicine

## 2024-05-22 ENCOUNTER — Institutional Professional Consult (permissible substitution) (INDEPENDENT_AMBULATORY_CARE_PROVIDER_SITE_OTHER)

## 2024-05-23 ENCOUNTER — Institutional Professional Consult (permissible substitution) (INDEPENDENT_AMBULATORY_CARE_PROVIDER_SITE_OTHER)

## 2024-05-30 ENCOUNTER — Ambulatory Visit: Admitting: Sports Medicine

## 2024-06-09 ENCOUNTER — Ambulatory Visit: Admitting: Sports Medicine

## 2024-07-04 ENCOUNTER — Ambulatory Visit

## 2024-07-18 ENCOUNTER — Institutional Professional Consult (permissible substitution) (INDEPENDENT_AMBULATORY_CARE_PROVIDER_SITE_OTHER)

## 2024-07-27 ENCOUNTER — Ambulatory Visit: Admitting: Family Medicine
